# Patient Record
Sex: Female | Born: 1950 | Race: White | Hispanic: No | Marital: Married | Smoking: Never smoker
Health system: Southern US, Community
[De-identification: ages and names within clinical notes are randomized; demographics above are authoritative.]

## PROBLEM LIST (undated history)

## (undated) DIAGNOSIS — M25569 Pain in unspecified knee: Secondary | ICD-10-CM

## (undated) DIAGNOSIS — C541 Malignant neoplasm of endometrium: Secondary | ICD-10-CM

## (undated) DIAGNOSIS — I10 Essential (primary) hypertension: Secondary | ICD-10-CM

## (undated) DIAGNOSIS — Z87442 Personal history of urinary calculi: Secondary | ICD-10-CM

## (undated) DIAGNOSIS — E785 Hyperlipidemia, unspecified: Secondary | ICD-10-CM

## (undated) DIAGNOSIS — E119 Type 2 diabetes mellitus without complications: Secondary | ICD-10-CM

## (undated) DIAGNOSIS — N200 Calculus of kidney: Secondary | ICD-10-CM

## (undated) HISTORY — DX: Type 2 diabetes mellitus without complications: E11.9

## (undated) HISTORY — DX: Calculus of kidney: N20.0

## (undated) HISTORY — DX: Malignant neoplasm of endometrium: C54.1

## (undated) HISTORY — PX: APPENDECTOMY: SHX54

## (undated) HISTORY — DX: Hyperlipidemia, unspecified: E78.5

## (undated) HISTORY — DX: Pain in unspecified knee: M25.569

## (undated) HISTORY — PX: TUBAL LIGATION: SHX77

---

## 2015-06-16 DIAGNOSIS — H2513 Age-related nuclear cataract, bilateral: Secondary | ICD-10-CM | POA: Diagnosis not present

## 2015-08-04 DIAGNOSIS — S8391XA Sprain of unspecified site of right knee, initial encounter: Secondary | ICD-10-CM | POA: Diagnosis not present

## 2015-08-30 ENCOUNTER — Ambulatory Visit (INDEPENDENT_AMBULATORY_CARE_PROVIDER_SITE_OTHER): Payer: Medicare Other | Admitting: Unknown Physician Specialty

## 2015-08-30 ENCOUNTER — Encounter: Payer: Self-pay | Admitting: Unknown Physician Specialty

## 2015-08-30 VITALS — BP 179/96 | HR 73 | Temp 98.2°F | Ht 60.8 in | Wt 189.2 lb

## 2015-08-30 DIAGNOSIS — Z0000 Encounter for general adult medical examination without abnormal findings: Secondary | ICD-10-CM | POA: Diagnosis not present

## 2015-08-30 DIAGNOSIS — M19042 Primary osteoarthritis, left hand: Secondary | ICD-10-CM | POA: Diagnosis not present

## 2015-08-30 DIAGNOSIS — E669 Obesity, unspecified: Secondary | ICD-10-CM | POA: Insufficient documentation

## 2015-08-30 DIAGNOSIS — M1990 Unspecified osteoarthritis, unspecified site: Secondary | ICD-10-CM | POA: Insufficient documentation

## 2015-08-30 DIAGNOSIS — I10 Essential (primary) hypertension: Secondary | ICD-10-CM

## 2015-08-30 DIAGNOSIS — E1159 Type 2 diabetes mellitus with other circulatory complications: Secondary | ICD-10-CM | POA: Insufficient documentation

## 2015-08-30 NOTE — Progress Notes (Signed)
**Note De-Identified Vert Obfuscation** BP 179/96 mmHg  Pulse 73  Temp(Src) 98.2 F (36.8 C)  Ht 5' 0.8" (1.544 m)  Wt 189 lb 3.2 oz (85.821 kg)  BMI 36.00 kg/m2  SpO2 96%  LMP  (LMP Unknown)   Subjective:    Patient ID: Suzanne Jennings, female    DOB: [birth year], 65 y.o.   MRN: [id number]  HPI: [name] is a 65 y.o. female  Chief Complaint  Patient presents with  . Hand Pain    pt states she has been having pain in left fingers and hand from a fall she had years ago   Pt is here to be established as a patient.  She is on no medications and has no health problems.  She is not sure of her family history as she was raised in an orphanage.    Hypertension Average home BP: Checks her BP every week.  It typically runs below SBP of 120  No problems or lightheadedness No chest pain with exertion or shortness of breath No Edema  Family history significant for hypertension  Checks her BP every week.  It typically runs below SBP of 120.    Relevant past medical, surgical, family and social history reviewed and updated as indicated. Interim medical history since our last visit reviewed. Allergies and medications reviewed and updated.  Review of Systems  Constitutional: Negative.   HENT: Negative.   Eyes: Negative.   Respiratory: Negative.   Cardiovascular: Negative.   Gastrointestinal: Negative.   Endocrine: Negative.   Genitourinary: Negative.   Musculoskeletal: Negative.        15 years ago has pain 2 left lateral fingers ever since a fall 15 years ago  Skin: Negative.   Allergic/Immunologic: Negative.   Neurological: Negative.   Hematological: Negative.   Psychiatric/Behavioral: Negative.     Per HPI unless specifically indicated above     Objective:    BP 179/96 mmHg  Pulse 73  Temp(Src) 98.2 F (36.8 C)  Ht 5' 0.8" (1.544 m)  Wt 189 lb 3.2 oz (85.821 kg)  BMI 36.00 kg/m2  SpO2 96%  LMP  (LMP Unknown)  Wt Readings from Last 3 Encounters:  08/30/15 189 lb 3.2 oz (85.821 kg)    Physical  Exam  Constitutional: She is oriented to person, place, and time. She appears well-developed and well-nourished. No distress.  HENT:  Head: Normocephalic and atraumatic.  Eyes: Conjunctivae and lids are normal. Right eye exhibits no discharge. Left eye exhibits no discharge. No scleral icterus.  Neck: Normal range of motion. Neck supple. No JVD present. Carotid bruit is not present.  Cardiovascular: Normal rate, regular rhythm and normal heart sounds.   Pulmonary/Chest: Effort normal and breath sounds normal.  Abdominal: Normal appearance. There is no splenomegaly or hepatomegaly.  Musculoskeletal: Normal range of motion.  Neurological: She is alert and oriented to person, place, and time.  Skin: Skin is warm, dry and intact. No rash noted. No pallor.  Psychiatric: She has a normal mood and affect. Her behavior is normal. Judgment and thought content normal.    No results found for this or any previous visit.    Assessment & Plan:   Problem List Items Addressed This Visit      Unprioritized   Hypertension - Primary    Pt normotensive when she checks it at home.  High here.  Bring in home BP cuff next visit.        Relevant Orders   Comprehensive metabolic panel **Note De-Identified Baranski Obfuscation** Microalbumin, Urine Waived   TSH   Lipid Panel w/o Chol/HDL Ratio   Osteoarthritis (arthritis due to wear and tear of joints)    Supportive care      Obesity    Other Visit Diagnoses    Routine general medical examination at a health care facility        Relevant Orders    Hepatitis C antibody        Follow up plan: Return for physical.   Orders given for a td and Zostavax vaccine

## 2015-08-30 NOTE — Assessment & Plan Note (Signed)
**Note De-Identified Deroche Obfuscation** Pt normotensive when she checks it at home.  High here.  Bring in home BP cuff next visit.

## 2015-08-30 NOTE — Assessment & Plan Note (Signed)
**Note De-identified Gemma Obfuscation** Supportive care. 

## 2015-08-31 ENCOUNTER — Other Ambulatory Visit: Payer: Self-pay | Admitting: Unknown Physician Specialty

## 2015-08-31 DIAGNOSIS — E7800 Pure hypercholesterolemia, unspecified: Secondary | ICD-10-CM

## 2015-08-31 LAB — LIPID PANEL W/O CHOL/HDL RATIO
Cholesterol, Total: 284 mg/dL — ABNORMAL HIGH (ref 100–199)
HDL: 35 mg/dL — ABNORMAL LOW (ref >39)
LDL CALC: 199 mg/dL — AB (ref 0–99)
Triglycerides: 248 mg/dL — ABNORMAL HIGH (ref 0–149)
VLDL Cholesterol Cal: 50 mg/dL — ABNORMAL HIGH (ref 5–40)

## 2015-08-31 LAB — MICROALBUMIN, URINE WAIVED
CREATININE, URINE WAIVED: 200 mg/dL (ref 10–300)
MICROALB, UR WAIVED: 30 mg/L — AB (ref 0–19)
Microalb/Creat Ratio: <30 mg/g (ref <30)

## 2015-08-31 LAB — COMPREHENSIVE METABOLIC PANEL
A/G RATIO: 1.4 (ref 1.1–2.5)
ALK PHOS: 79 IU/L (ref 39–117)
ALT: 32 IU/L (ref 0–32)
AST: 28 IU/L (ref 0–40)
Albumin: 4.2 g/dL (ref 3.6–4.8)
BUN/Creatinine Ratio: 16 (ref 11–26)
BUN: 12 mg/dL (ref 8–27)
Bilirubin Total: 0.7 mg/dL (ref 0.0–1.2)
CO2: 23 mmol/L (ref 18–29)
Calcium: 9.4 mg/dL (ref 8.7–10.3)
Chloride: 99 mmol/L (ref 96–106)
Creatinine, Ser: 0.75 mg/dL (ref 0.57–1.00)
GFR calc Af Amer: 96 mL/min/{1.73_m2} (ref >59)
GFR calc non Af Amer: 83 mL/min/{1.73_m2} (ref >59)
GLOBULIN, TOTAL: 2.9 g/dL (ref 1.5–4.5)
Glucose: 91 mg/dL (ref 65–99)
POTASSIUM: 4.1 mmol/L (ref 3.5–5.2)
SODIUM: 138 mmol/L (ref 134–144)
Total Protein: 7.1 g/dL (ref 6.0–8.5)

## 2015-08-31 LAB — TSH: TSH: 2.19 u[IU]/mL (ref 0.450–4.500)

## 2015-08-31 LAB — HEPATITIS C ANTIBODY

## 2015-09-21 ENCOUNTER — Ambulatory Visit (INDEPENDENT_AMBULATORY_CARE_PROVIDER_SITE_OTHER): Payer: Medicare Other | Admitting: Unknown Physician Specialty

## 2015-09-21 ENCOUNTER — Encounter: Payer: Self-pay | Admitting: Unknown Physician Specialty

## 2015-09-21 VITALS — BP 166/91 | HR 86 | Temp 98.2°F | Ht 61.1 in | Wt 177.2 lb

## 2015-09-21 DIAGNOSIS — Z0000 Encounter for general adult medical examination without abnormal findings: Secondary | ICD-10-CM

## 2015-09-21 DIAGNOSIS — I10 Essential (primary) hypertension: Secondary | ICD-10-CM

## 2015-09-21 DIAGNOSIS — E7800 Pure hypercholesterolemia, unspecified: Secondary | ICD-10-CM

## 2015-09-21 DIAGNOSIS — E785 Hyperlipidemia, unspecified: Secondary | ICD-10-CM | POA: Insufficient documentation

## 2015-09-21 DIAGNOSIS — Z780 Asymptomatic menopausal state: Secondary | ICD-10-CM | POA: Diagnosis not present

## 2015-09-21 LAB — LIPID PANEL PICCOLO, WAIVED
CHOLESTEROL PICCOLO, WAIVED: 236 mg/dL — AB (ref <200)
Chol/HDL Ratio Piccolo,Waive: 6.4 mg/dL — ABNORMAL HIGH
HDL CHOL PICCOLO, WAIVED: 37 mg/dL — AB (ref >59)
LDL Chol Calc Piccolo Waived: 138 mg/dL — ABNORMAL HIGH (ref <100)
TRIGLYCERIDES PICCOLO,WAIVED: 305 mg/dL — AB (ref <150)
VLDL Chol Calc Piccolo,Waive: 61 mg/dL — ABNORMAL HIGH (ref <30)

## 2015-09-21 MED ORDER — ASPIRIN EC 81 MG PO TBEC: 81.0000 mg | DELAYED_RELEASE_TABLET | Freq: Every day | ORAL | Status: AC

## 2015-09-21 MED ORDER — LISINOPRIL 10 MG PO TABS: 10.0000 mg | ORAL_TABLET | Freq: Every day | ORAL | Status: DC

## 2015-09-21 NOTE — Assessment & Plan Note (Signed)
**Note De-Identified Cleckley Obfuscation** LDL down from 199 to 138.  Will continue to work on lifestyle rather than starting a cholesterol medication

## 2015-09-21 NOTE — Assessment & Plan Note (Signed)
**Note De-Identified Lutze Obfuscation** High today with some improvement with lifestyle changes.  Start Lisinopril 10 ng.

## 2015-09-21 NOTE — Progress Notes (Signed)
**Note De-Identified Chivers Obfuscation** BP 166/91 mmHg  Pulse 86  Temp(Src) 98.2 F (36.8 C)  Ht 5' 1.1" (1.552 m)  Wt 177 lb 3.2 oz (80.377 kg)  BMI 33.37 kg/m2  SpO2 96%  LMP  (LMP Unknown)   Subjective:    Patient ID: Suzanne Jennings, female    DOB: 07-29-1951, 65 y.o.   MRN: [id number]  HPI: [name] is a 65 y.o. female  Chief Complaint  Patient presents with  . Medicare Wellness   Functional Status Survey: Is the patient deaf or have difficulty hearing?: No Does the patient have difficulty seeing, even when wearing glasses/contacts?: No Does the patient have difficulty concentrating, remembering, or making decisions?: No Does the patient have difficulty walking or climbing stairs?: No Does the patient have difficulty dressing or bathing?: No Does the patient have difficulty doing errands alone such as visiting a doctor's office or shopping?: No  Depression screen [facility] 2/9 09/21/2015 08/30/2015  Decreased Interest 0 0  Down, Depressed, Hopeless 0 0  PHQ - 2 Score 0 0    Fall Risk  09/21/2015 08/30/2015  Falls in the past year? No No    Hypertension Better than previous but still high Average home BPs SBP 160s at home  No problems or lightheadedness No chest pain with exertion or shortness of breath No Edema  Relevant past medical, surgical, family and social history reviewed and updated as indicated. Interim medical history since our last visit reviewed. Allergies and medications reviewed and updated.  Review of Systems  Constitutional: Negative.   HENT: Negative.   Eyes: Negative.   Respiratory: Negative.   Cardiovascular: Negative.   Gastrointestinal: Negative.   Endocrine: Negative.   Genitourinary: Negative.   Musculoskeletal: Negative.   Skin: Negative.   Allergic/Immunologic: Negative.   Neurological: Negative.   Hematological: Negative.   Psychiatric/Behavioral: Negative.     Per HPI unless specifically indicated above     Objective:    BP 166/91 mmHg  Pulse 86  Temp(Src) 98.2  F (36.8 C)  Ht 5' 1.1" (1.552 m)  Wt 177 lb 3.2 oz (80.377 kg)  BMI 33.37 kg/m2  SpO2 96%  LMP  (LMP Unknown)  Wt Readings from Last 3 Encounters:  09/21/15 177 lb 3.2 oz (80.377 kg)  08/30/15 189 lb 3.2 oz (85.821 kg)    Physical Exam  Constitutional: She is oriented to person, place, and time. She appears well-developed and well-nourished.  HENT:  Head: Normocephalic and atraumatic.  Eyes: Pupils are equal, round, and reactive to light. Right eye exhibits no discharge. Left eye exhibits no discharge. No scleral icterus.  Neck: Normal range of motion. Neck supple. Carotid bruit is not present. No thyromegaly present.  Cardiovascular: Normal rate, regular rhythm and normal heart sounds.  Exam reveals no gallop and no friction rub.   No murmur heard. Pulmonary/Chest: Effort normal and breath sounds normal. No respiratory distress. She has no wheezes. She has no rales.  Abdominal: Soft. Bowel sounds are normal. There is no tenderness. There is no rebound.  Genitourinary: No breast swelling, tenderness or discharge.  Musculoskeletal: Normal range of motion.  Lymphadenopathy:    She has no cervical adenopathy.  Neurological: She is alert and oriented to person, place, and time.  Skin: Skin is warm, dry and intact. No rash noted.  Psychiatric: She has a normal mood and affect. Her speech is normal and behavior is normal. Judgment and thought content normal. Cognition and memory are normal.    Results for orders placed **Note De-Identified Heffron Obfuscation** or performed in visit on 08/30/15  Comprehensive metabolic panel  Result Value Ref Range   Glucose 91 65 - 99 mg/dL   BUN 12 8 - 27 mg/dL   Creatinine, Ser 0.75 0.57 - 1.00 mg/dL   GFR calc non Af Amer 83 >59 mL/min/1.73   GFR calc Af Amer 96 >59 mL/min/1.73   BUN/Creatinine Ratio 16 11 - 26   Sodium 138 134 - 144 mmol/L   Potassium 4.1 3.5 - 5.2 mmol/L   Chloride 99 96 - 106 mmol/L   CO2 23 18 - 29 mmol/L   Calcium 9.4 8.7 - 10.3 mg/dL   Total Protein 7.1 6.0 -  8.5 g/dL   Albumin 4.2 3.6 - 4.8 g/dL   Globulin, Total 2.9 1.5 - 4.5 g/dL   Albumin/Globulin Ratio 1.4 1.1 - 2.5   Bilirubin Total 0.7 0.0 - 1.2 mg/dL   Alkaline Phosphatase 79 39 - 117 IU/L   AST 28 0 - 40 IU/L   ALT 32 0 - 32 IU/L  Microalbumin, Urine Waived  Result Value Ref Range   Microalb, Ur Waived 30 (H) 0 - 19 mg/L   Creatinine, Urine Waived 200 10 - 300 mg/dL   Microalb/Creat Ratio <30 <30 mg/g  TSH  Result Value Ref Range   TSH 2.190 0.450 - 4.500 uIU/mL  Lipid Panel w/o Chol/HDL Ratio  Result Value Ref Range   Cholesterol, Total 284 (H) 100 - 199 mg/dL   Triglycerides 248 (H) 0 - 149 mg/dL   HDL 35 (L) >39 mg/dL   VLDL Cholesterol Cal 50 (H) 5 - 40 mg/dL   LDL Calculated 199 (H) 0 - 99 mg/dL  Hepatitis C antibody  Result Value Ref Range   Hep C Virus Ab <0.1 0.0 - 0.9 s/co ratio      Assessment & Plan:   Problem List Items Addressed This Visit      Unprioritized   Hypertension    High today with some improvement with lifestyle changes.  Start Lisinopril 10 ng.        Relevant Orders   EKG 12-Lead   Hypercholesteremia - Primary   Relevant Orders   Lipid Panel Piccolo, Waived   EKG 12-Lead    Other Visit Diagnoses    Routine general medical examination at a health care facility        Relevant Orders    Cologuard    MM DIGITAL SCREENING BILATERAL    Menopause        Relevant Orders    DG Bone Density        Follow up plan: Return in about 1 month (around 10/19/2015).

## 2015-09-21 NOTE — Patient Instructions (Addendum)
**Note De-identified Friel Obfuscation** Please do call to schedule your mammogram and bone density; the number to schedule one at either [facility] or [facility] is [phone]   

## 2015-09-28 ENCOUNTER — Other Ambulatory Visit: Payer: Self-pay

## 2015-09-28 MED ORDER — LISINOPRIL 10 MG PO TABS: 10.0000 mg | ORAL_TABLET | Freq: Every day | ORAL | Status: DC

## 2015-09-28 NOTE — Addendum Note (Signed)
**Note De-Identified Cremeens Obfuscation** Addended by: Kathrine Haddock on: 09/28/2015 05:12 PM   Modules accepted: Orders

## 2015-09-28 NOTE — Telephone Encounter (Signed)
**Note De-Identified Staniszewski Obfuscation** We just refilled this on 09/21/15 but pharmacy is stating that insurance is requesting a 90 day supply.

## 2015-09-28 NOTE — Telephone Encounter (Signed)
**Note De-identified Tones Obfuscation** Pharmacy requests 90 day supply

## 2015-10-05 ENCOUNTER — Other Ambulatory Visit: Payer: Self-pay | Admitting: Unknown Physician Specialty

## 2015-10-05 ENCOUNTER — Encounter: Payer: Self-pay | Admitting: Unknown Physician Specialty

## 2015-10-05 ENCOUNTER — Ambulatory Visit
Admission: RE | Admit: 2015-10-05 | Discharge: 2015-10-05 | Disposition: A | Discharge status: Home / Self Care | Payer: Medicare Other | Source: Ambulatory Visit | Attending: Unknown Physician Specialty | Admitting: Unknown Physician Specialty

## 2015-10-05 DIAGNOSIS — Z13820 Encounter for screening for osteoporosis: Secondary | ICD-10-CM | POA: Insufficient documentation

## 2015-10-05 DIAGNOSIS — Z780 Asymptomatic menopausal state: Secondary | ICD-10-CM

## 2015-10-05 DIAGNOSIS — Z1231 Encounter for screening mammogram for malignant neoplasm of breast: Secondary | ICD-10-CM | POA: Insufficient documentation

## 2015-10-05 DIAGNOSIS — Z0000 Encounter for general adult medical examination without abnormal findings: Secondary | ICD-10-CM

## 2015-10-05 DIAGNOSIS — M810 Age-related osteoporosis without current pathological fracture: Secondary | ICD-10-CM | POA: Diagnosis not present

## 2015-10-05 NOTE — Progress Notes (Signed)
**Note De-identified Payne Obfuscation** Quick Note:  Normal labs. Patient notified by letter. ______ 

## 2015-10-20 ENCOUNTER — Ambulatory Visit (INDEPENDENT_AMBULATORY_CARE_PROVIDER_SITE_OTHER): Payer: Medicare Other | Admitting: Unknown Physician Specialty

## 2015-10-20 ENCOUNTER — Encounter: Payer: Self-pay | Admitting: Unknown Physician Specialty

## 2015-10-20 VITALS — BP 136/79 | HR 71 | Temp 98.0°F | Ht 60.8 in | Wt 172.0 lb

## 2015-10-20 DIAGNOSIS — E7800 Pure hypercholesterolemia, unspecified: Secondary | ICD-10-CM | POA: Diagnosis not present

## 2015-10-20 DIAGNOSIS — I10 Essential (primary) hypertension: Secondary | ICD-10-CM

## 2015-10-20 DIAGNOSIS — Z0000 Encounter for general adult medical examination without abnormal findings: Secondary | ICD-10-CM

## 2015-10-20 DIAGNOSIS — E669 Obesity, unspecified: Secondary | ICD-10-CM | POA: Diagnosis not present

## 2015-10-20 LAB — LIPID PANEL PICCOLO, WAIVED
Chol/HDL Ratio Piccolo,Waive: 6.1 mg/dL — ABNORMAL HIGH
Cholesterol Piccolo, Waived: 261 mg/dL — ABNORMAL HIGH (ref <200)
HDL Chol Piccolo, Waived: 43 mg/dL — ABNORMAL LOW (ref >59)
LDL CHOL CALC PICCOLO WAIVED: 169 mg/dL — AB (ref <100)
Triglycerides Piccolo,Waived: 246 mg/dL — ABNORMAL HIGH (ref <150)
VLDL CHOL CALC PICCOLO,WAIVE: 49 mg/dL — AB (ref <30)

## 2015-10-20 NOTE — Assessment & Plan Note (Signed)
**Note De-Identified Cadenhead Obfuscation** Patient doing well and losing weight. Continue current diet regimen.

## 2015-10-20 NOTE — Assessment & Plan Note (Signed)
**Note De-identified Barbone Obfuscation** Stable, continue present medications.   

## 2015-10-20 NOTE — Progress Notes (Signed)
**Note De-Identified Goree Obfuscation** BP 136/79 mmHg  Pulse 71  Temp(Src) 98 F (36.7 C)  Ht 5' 0.8" (1.544 m)  Wt 172 lb (78.019 kg)  BMI 32.73 kg/m2  SpO2 98%  LMP  (LMP Unknown)   Subjective:    Patient ID: Suzanne Jennings, female    DOB: 1951-07-05, 65 y.o.   MRN: [id number]  HPI: Suzanne Jennings is a 65 y.o. female  Chief Complaint  Patient presents with  . Hypertension   Hypertension Using medications without difficulty Average home BPs: 130s/70s No problems or lightheadedness No chest pain with exertion or shortness of breath No Edema  Obesity: Patient has been working on losing weight and is doing really well. Doing the [employer] diet-no sugar, breads, or fried foods. States she "feels great."  Hypercholesteremia: Not currently on medication. Has been losing weight with diet and exercise.   Relevant past medical, surgical, family and social history reviewed and updated as indicated. Interim medical history since our last visit reviewed. Allergies and medications reviewed and updated.  Review of Systems  Constitutional: Negative.   HENT: Negative.   Eyes: Negative.   Respiratory: Negative.   Cardiovascular: Negative.   Gastrointestinal: Negative.   Endocrine: Negative.   Genitourinary: Negative.   Musculoskeletal: Negative.   Skin: Negative.   Allergic/Immunologic: Negative.   Neurological: Negative.   Hematological: Negative.   Psychiatric/Behavioral: Negative.     Per HPI unless specifically indicated above     Objective:    BP 136/79 mmHg  Pulse 71  Temp(Src) 98 F (36.7 C)  Ht 5' 0.8" (1.544 m)  Wt 172 lb (78.019 kg)  BMI 32.73 kg/m2  SpO2 98%  LMP  (LMP Unknown)  Wt Readings from Last 3 Encounters:  10/20/15 172 lb (78.019 kg)  09/21/15 177 lb 3.2 oz (80.377 kg)  08/30/15 189 lb 3.2 oz (85.821 kg)    Physical Exam  Constitutional: She is oriented to person, place, and time. She appears well-developed and well-nourished. No distress.  HENT:  Head: Normocephalic and  atraumatic.  Eyes: Conjunctivae and lids are normal. Right eye exhibits no discharge. Left eye exhibits no discharge. No scleral icterus.  Neck: Normal range of motion. Neck supple. No JVD present. Carotid bruit is not present.  Cardiovascular: Normal rate, regular rhythm and normal heart sounds.   Pulmonary/Chest: Effort normal and breath sounds normal.  Abdominal: Normal appearance. There is no splenomegaly or hepatomegaly.  Musculoskeletal: Normal range of motion.  Neurological: She is alert and oriented to person, place, and time.  Skin: Skin is warm, dry and intact. No rash noted. No pallor.  Psychiatric: She has a normal mood and affect. Her behavior is normal. Judgment and thought content normal.    Results for orders placed or performed in visit on 09/21/15  Lipid Panel Piccolo, [employer]  Result Value Ref Range   Cholesterol Piccolo, Waived 236 (H) <200 mg/dL   HDL Chol Piccolo, Waived 37 (L) >59 mg/dL   Triglycerides Piccolo,Waived 305 (H) <150 mg/dL   Chol/HDL Ratio Piccolo,Waive 6.4 (H) mg/dL   LDL Chol Calc Piccolo Waived 138 (H) <100 mg/dL   VLDL Chol Calc Piccolo,Waive 61 (H) <30 mg/dL      Assessment & Plan:   Problem List Items Addressed This Visit      Unprioritized   Hypertension - Primary    Stable, continue present medications.        Relevant Orders   Comprehensive metabolic panel   Obesity    Patient doing well and **Note De-Identified Lemke Obfuscation** losing weight. Continue current diet regimen.       Hypercholesteremia    Per ASCVD calculator, moderate-high intensity statin recommended, however triglycerides and HDL improving with diet and exercise. Will recheck in 3 months and consider adding statin at that time.      Relevant Orders   Lipid Panel Piccolo, [place]    Other Visit Diagnoses    Routine general medical examination at a health care facility            Follow up plan: Return in about 3 months (around 01/20/2016).

## 2015-10-20 NOTE — Assessment & Plan Note (Signed)
**Note De-Identified Filter Obfuscation** Per ASCVD calculator, moderate-high intensity statin recommended, however triglycerides and HDL improving with diet and exercise. Will recheck in 3 months and consider adding statin at that time.

## 2015-10-21 LAB — COMPREHENSIVE METABOLIC PANEL
A/G RATIO: 1.4 (ref 1.1–2.5)
ALBUMIN: 4.2 g/dL (ref 3.6–4.8)
ALK PHOS: 77 IU/L (ref 39–117)
ALT: 50 IU/L — ABNORMAL HIGH (ref 0–32)
AST: 30 IU/L (ref 0–40)
BILIRUBIN TOTAL: 0.6 mg/dL (ref 0.0–1.2)
BUN / CREAT RATIO: 11 (ref 11–26)
BUN: 9 mg/dL (ref 8–27)
CHLORIDE: 100 mmol/L (ref 96–106)
CO2: 24 mmol/L (ref 18–29)
Calcium: 9.7 mg/dL (ref 8.7–10.3)
Creatinine, Ser: 0.85 mg/dL (ref 0.57–1.00)
GFR calc non Af Amer: 72 mL/min/{1.73_m2} (ref >59)
GFR, EST AFRICAN AMERICAN: 83 mL/min/{1.73_m2} (ref >59)
GLUCOSE: 104 mg/dL — AB (ref 65–99)
Globulin, Total: 3 g/dL (ref 1.5–4.5)
POTASSIUM: 5.1 mmol/L (ref 3.5–5.2)
Sodium: 141 mmol/L (ref 134–144)
Total Protein: 7.2 g/dL (ref 6.0–8.5)

## 2016-01-21 ENCOUNTER — Encounter: Payer: Self-pay | Admitting: Unknown Physician Specialty

## 2016-01-21 ENCOUNTER — Ambulatory Visit (INDEPENDENT_AMBULATORY_CARE_PROVIDER_SITE_OTHER): Payer: Medicare Other | Admitting: Unknown Physician Specialty

## 2016-01-21 VITALS — BP 136/84 | HR 76 | Temp 97.7°F | Ht 61.0 in | Wt 174.0 lb

## 2016-01-21 DIAGNOSIS — I10 Essential (primary) hypertension: Secondary | ICD-10-CM

## 2016-01-21 DIAGNOSIS — E8881 Metabolic syndrome: Secondary | ICD-10-CM

## 2016-01-21 DIAGNOSIS — L649 Androgenic alopecia, unspecified: Secondary | ICD-10-CM | POA: Diagnosis not present

## 2016-01-21 DIAGNOSIS — Z0000 Encounter for general adult medical examination without abnormal findings: Secondary | ICD-10-CM | POA: Diagnosis not present

## 2016-01-21 DIAGNOSIS — E7800 Pure hypercholesterolemia, unspecified: Secondary | ICD-10-CM

## 2016-01-21 LAB — LIPID PANEL PICCOLO, WAIVED
Chol/HDL Ratio Piccolo,Waive: 6.1 mg/dL — ABNORMAL HIGH
Cholesterol Piccolo, Waived: 272 mg/dL — ABNORMAL HIGH
HDL Chol Piccolo, Waived: 44 mg/dL — ABNORMAL LOW
LDL Chol Calc Piccolo Waived: 171 mg/dL — ABNORMAL HIGH
Triglycerides Piccolo,Waived: 283 mg/dL — ABNORMAL HIGH
VLDL Chol Calc Piccolo,Waive: 57 mg/dL — ABNORMAL HIGH

## 2016-01-21 MED ORDER — SPIRONOLACTONE 25 MG PO TABS: 25.0000 mg | ORAL_TABLET | Freq: Every day | ORAL | Status: DC

## 2016-01-21 MED ORDER — LISINOPRIL 10 MG PO TABS: 10.0000 mg | ORAL_TABLET | Freq: Every day | ORAL | Status: DC

## 2016-01-21 MED ORDER — ATORVASTATIN CALCIUM 10 MG PO TABS: 10.0000 mg | ORAL_TABLET | Freq: Every day | ORAL | Status: DC

## 2016-01-21 NOTE — Progress Notes (Signed)
**Note De-Identified Gadbois Obfuscation** BP 136/84 mmHg  Pulse 76  Temp(Src) 97.7 F (36.5 C)  Ht 5\' 1"  (1.549 m)  Wt 174 lb (78.926 kg)  BMI 32.89 kg/m2  SpO2 95%  LMP  (LMP Unknown)   Subjective:    Patient ID: Suzanne Jennings, female    DOB: 1951/01/14, 65 y.o.   MRN: [id number]  HPI: Suzanne Jennings is a 65 y.o. female  Chief Complaint  Patient presents with  . Hypertension  . Hyperlipidemia  . Medication Refill    pt states she needs refill on lisinopril   Hypertension Using medications without difficulty Average home BPs   No problems or lightheadedness No chest pain with exertion or shortness of breath No Edema   Hyperlipidemia Using medications without problems: No Muscle aches  Diet compliance:On a low sugar diet Exercise: walking   Relevant past medical, surgical, family and social history reviewed and updated as indicated. Interim medical history since our last visit reviewed. Allergies and medications reviewed and updated.  Review of Systems  Constitutional: Negative.   HENT: Negative.   Eyes: Negative.   Respiratory: Negative.   Cardiovascular: Negative.   Gastrointestinal: Negative.   Endocrine: Negative.   Genitourinary: Negative.   Musculoskeletal: Negative.   Skin:       She is noticing hair loss  Allergic/Immunologic: Negative.   Neurological: Negative.   Hematological: Negative.   Psychiatric/Behavioral: Negative.     Per HPI unless specifically indicated above     Objective:    BP 136/84 mmHg  Pulse 76  Temp(Src) 97.7 F (36.5 C)  Ht 5\' 1"  (1.549 m)  Wt 174 lb (78.926 kg)  BMI 32.89 kg/m2  SpO2 95%  LMP  (LMP Unknown)  Wt Readings from Last 3 Encounters:  01/21/16 174 lb (78.926 kg)  10/20/15 172 lb (78.019 kg)  09/21/15 177 lb 3.2 oz (80.377 kg)    Physical Exam  Constitutional: She is oriented to person, place, and time. She appears well-developed and well-nourished. No distress.  HENT:  Head: Normocephalic and atraumatic.  Eyes: Conjunctivae and lids are  normal. Right eye exhibits no discharge. Left eye exhibits no discharge. No scleral icterus.  Neck: Normal range of motion. Neck supple. No JVD present. Carotid bruit is not present.  Cardiovascular: Normal rate, regular rhythm and normal heart sounds.   Pulmonary/Chest: Effort normal and breath sounds normal.  Abdominal: Normal appearance. There is no splenomegaly or hepatomegaly.  Musculoskeletal: Normal range of motion.  Neurological: She is alert and oriented to person, place, and time.  Skin: Skin is warm, dry and intact. No rash noted. No pallor.  Noted female pattern hair thinning  Psychiatric: She has a normal mood and affect. Her behavior is normal. Judgment and thought content normal.    Results for orders placed or performed in visit on 10/20/15  Lipid Panel Piccolo, [employer]  Result Value Ref Range   Cholesterol Piccolo, Waived 261 (H) <200 mg/dL   HDL Chol Piccolo, Waived 43 (L) >59 mg/dL   Triglycerides Piccolo,Waived 246 (H) <150 mg/dL   Chol/HDL Ratio Piccolo,Waive 6.1 (H) mg/dL   LDL Chol Calc Piccolo Waived 169 (H) <100 mg/dL   VLDL Chol Calc Piccolo,Waive 49 (H) <30 mg/dL  Comprehensive metabolic panel  Result Value Ref Range   Glucose 104 (H) 65 - 99 mg/dL   BUN 9 8 - 27 mg/dL   Creatinine, Ser 0.85 0.57 - 1.00 mg/dL   GFR calc non Af Amer 72 >59 mL/min/1.73   GFR calc Af **Note De-Identified Roher Obfuscation** Amer 83 >59 mL/min/1.73   BUN/Creatinine Ratio 11 11 - 26   Sodium 141 134 - 144 mmol/L   Potassium 5.1 3.5 - 5.2 mmol/L   Chloride 100 96 - 106 mmol/L   CO2 24 18 - 29 mmol/L   Calcium 9.7 8.7 - 10.3 mg/dL   Total Protein 7.2 6.0 - 8.5 g/dL   Albumin 4.2 3.6 - 4.8 g/dL   Globulin, Total 3.0 1.5 - 4.5 g/dL   Albumin/Globulin Ratio 1.4 1.1 - 2.5   Bilirubin Total 0.6 0.0 - 1.2 mg/dL   Alkaline Phosphatase 77 39 - 117 IU/L   AST 30 0 - 40 IU/L   ALT 50 (H) 0 - 32 IU/L      Assessment & Plan:   Problem List Items Addressed This Visit      Unprioritized   Alopecia, 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  Check K at next visit.        Hypercholesteremia    Discussed labs.  LDL is 171.  Start Atorvastatin 10 mg.        Relevant Medications   atorvastatin (LIPITOR) 10 MG tablet   lisinopril (PRINIVIL,ZESTRIL) 10 MG tablet   Other Relevant Orders   Lipid Panel Piccolo, Waived   Hypertension - Primary    Stable, continue present medications.        Relevant Medications   atorvastatin (LIPITOR) 10 MG tablet   lisinopril (PRINIVIL,ZESTRIL) 10 MG tablet   Other Relevant Orders   Comprehensive metabolic panel   Metabolic syndrome    Other Visit Diagnoses    Routine general medical examination at a health care facility        Relevant Orders    Cologuard        Follow up plan: Return in about 3 months (around 04/22/2016) for CMP and Lipid panel.

## 2016-01-21 NOTE — Assessment & Plan Note (Addendum)
**Note De-Identified Wohl Obfuscation** Start Spironalactone 25 mg due to female pattern hair loss.  Stop Lisinopril

## 2016-01-21 NOTE — Assessment & Plan Note (Signed)
**Note De-Identified Lazarus Obfuscation** Start Spironalactone 25 mg.  Check K at next visit.

## 2016-01-21 NOTE — Assessment & Plan Note (Signed)
**Note De-Identified Panzer Obfuscation** Discussed labs.  LDL is 171.  Start Atorvastatin 10 mg.

## 2016-01-22 LAB — COMPREHENSIVE METABOLIC PANEL
ALBUMIN: 4.1 g/dL (ref 3.6–4.8)
ALT: 18 IU/L (ref 0–32)
AST: 18 IU/L (ref 0–40)
Albumin/Globulin Ratio: 1.4 (ref 1.2–2.2)
Alkaline Phosphatase: 68 IU/L (ref 39–117)
BUN / CREAT RATIO: 17 (ref 12–28)
BUN: 15 mg/dL (ref 8–27)
Bilirubin Total: 0.5 mg/dL (ref 0.0–1.2)
CALCIUM: 9.6 mg/dL (ref 8.7–10.3)
CO2: 24 mmol/L (ref 18–29)
CREATININE: 0.86 mg/dL (ref 0.57–1.00)
Chloride: 98 mmol/L (ref 96–106)
GFR calc non Af Amer: 71 mL/min/{1.73_m2} (ref >59)
GFR, EST AFRICAN AMERICAN: 81 mL/min/{1.73_m2} (ref >59)
GLUCOSE: 103 mg/dL — AB (ref 65–99)
Globulin, Total: 2.9 g/dL (ref 1.5–4.5)
Potassium: 4.7 mmol/L (ref 3.5–5.2)
Sodium: 138 mmol/L (ref 134–144)
TOTAL PROTEIN: 7 g/dL (ref 6.0–8.5)

## 2016-01-24 ENCOUNTER — Encounter: Payer: Self-pay | Admitting: Unknown Physician Specialty

## 2016-01-24 NOTE — Progress Notes (Signed)
**Note De-identified Trampe Obfuscation** Quick Note:  Normal labs. Patient notified by letter. ______ 

## 2016-04-12 DIAGNOSIS — Z1212 Encounter for screening for malignant neoplasm of rectum: Secondary | ICD-10-CM | POA: Diagnosis not present

## 2016-04-12 DIAGNOSIS — Z1211 Encounter for screening for malignant neoplasm of colon: Secondary | ICD-10-CM | POA: Diagnosis not present

## 2016-04-12 LAB — COLOGUARD: Cologuard: POSITIVE

## 2016-04-24 ENCOUNTER — Telehealth: Payer: Self-pay

## 2016-04-24 NOTE — Telephone Encounter (Signed)
**Note De-Identified Brumbaugh Obfuscation** Result for cologuard came in through fax- positive result.

## 2016-04-25 NOTE — Telephone Encounter (Signed)
**Note De-Identified Hudson Obfuscation** Called to discuss positive cologuard with patient. LMOM for her to call me back. Will need referral to GI. Await call back.

## 2016-05-02 ENCOUNTER — Ambulatory Visit: Payer: Medicare Other | Admitting: Unknown Physician Specialty

## 2016-05-03 ENCOUNTER — Telehealth: Payer: Self-pay

## 2016-05-03 DIAGNOSIS — R195 Other fecal abnormalities: Secondary | ICD-10-CM

## 2016-05-03 NOTE — Telephone Encounter (Signed)
**Note De-Identified Shryock Obfuscation** Patient called and left me a voicemail saying she was returning Cheryl's call. She would like a call back from [place] to discuss test results. [phone].

## 2016-05-03 NOTE — Telephone Encounter (Signed)
**Note De-Identified Marcott Obfuscation** Discussed with pt positive Cologuard.  Will refer to GI for further evaluation.

## 2016-05-05 ENCOUNTER — Other Ambulatory Visit: Payer: Self-pay

## 2016-05-05 ENCOUNTER — Telehealth: Payer: Self-pay

## 2016-05-05 NOTE — Telephone Encounter (Signed)
**Note De-Identified Rote Obfuscation** Rescheduled patient from 06/01/2016 to 07/03/2016. Pre cert is not required

## 2016-05-05 NOTE — Telephone Encounter (Signed)
**Note De-Identified Ruppert Obfuscation** Colonoscopy: Positive FIT R19.5 [facility] 05/29/2016 [employer]/Medicare Pre cert

## 2016-05-05 NOTE — Telephone Encounter (Signed)
**Note De-Identified Bellis Obfuscation** Gastroenterology Pre-Procedure Review  Request Date: 06/01/2016 Requesting Physician: Dr. Julian Hy  PATIENT REVIEW QUESTIONS: The patient responded to the following health history questions as indicated:    1. Are you having any GI issues? no 2. Do you have a personal history of Polyps? no 3. Do you have a family history of Colon Cancer or Polyps? no 4. Diabetes Mellitus? no 5. Joint replacements in the past 12 months?no 6. Major health problems in the past 3 months?no 7. Any artificial heart valves, MVP, or defibrillator?no    MEDICATIONS & ALLERGIES:    Patient reports the following regarding taking any anticoagulation/antiplatelet therapy:   Plavix, Coumadin, Eliquis, Xarelto, Lovenox, Pradaxa, Brilinta, or Effient? no Aspirin? yes ([facility])  Patient confirms/reports the following medications:  Current Outpatient Prescriptions  Medication Sig Dispense Refill  . aspirin EC 81 MG tablet Take 1 tablet (81 mg total) by mouth daily. 90 tablet 3  . atorvastatin (LIPITOR) 10 MG tablet Take 10 mg by mouth daily.    Marland Kitchen spironolactone (ALDACTONE) 25 MG tablet Take 1 tablet (25 mg total) by mouth daily. 90 tablet 1   No current facility-administered medications for this visit.     Patient confirms/reports the following allergies:  Allergies  Allergen Reactions  . Aspirin Other (See Comments)    "blood thinner, makes nose bleed"  . Peanut-Containing Drug Products   . Penicillins   . Sulfa Antibiotics     No orders of the defined types were placed in this encounter.   AUTHORIZATION INFORMATION Primary Insurance: 1D#: Group #:  Secondary Insurance: 1D#: Group #:  SCHEDULE INFORMATION: Date: 05/29/2016 Time: Location: [facility]

## 2016-05-05 NOTE — Telephone Encounter (Signed)
**Note De-Identified [place] Obfuscation** Notification or Prior Authorization is not required for the requested services  This [employer] Advantage members plan does not currently require a prior authorization for these services. If you have general questions about the prior authorization requirements, please call us at [phone] or visit [email or web address] > Clinician Resources > Advance and Admission Notification Requirements. The number above acknowledges your notification. Please write this number down for future reference. Notification is not a guarantee of coverage or payment.   Decision ID #: [id number]  The number above acknowledges your inquiry and our response. Please write this number down and refer to it for future inquiries. Coverage and payment for an item or service is governed by the member's benefit plan document, and, if applicable, the provider's participation agreement with the Health Plan. Thank you.

## 2016-05-12 ENCOUNTER — Ambulatory Visit: Payer: Medicare Other | Admitting: Unknown Physician Specialty

## 2016-05-29 ENCOUNTER — Encounter: Payer: Self-pay | Admitting: Unknown Physician Specialty

## 2016-05-29 ENCOUNTER — Ambulatory Visit (INDEPENDENT_AMBULATORY_CARE_PROVIDER_SITE_OTHER): Payer: Medicare Other | Admitting: Unknown Physician Specialty

## 2016-05-29 VITALS — BP 129/78 | HR 74 | Temp 98.5°F | Ht 61.1 in | Wt 184.2 lb

## 2016-05-29 DIAGNOSIS — I10 Essential (primary) hypertension: Secondary | ICD-10-CM

## 2016-05-29 DIAGNOSIS — E7800 Pure hypercholesterolemia, unspecified: Secondary | ICD-10-CM

## 2016-05-29 LAB — LIPID PANEL PICCOLO, WAIVED
CHOL/HDL RATIO PICCOLO,WAIVE: 4.3 mg/dL
CHOLESTEROL PICCOLO, WAIVED: 176 mg/dL (ref <200)
HDL CHOL PICCOLO, WAIVED: 41 mg/dL — AB (ref >59)
LDL Chol Calc Piccolo Waived: 76 mg/dL (ref <100)
TRIGLYCERIDES PICCOLO,WAIVED: 298 mg/dL — AB (ref <150)
VLDL Chol Calc Piccolo,Waive: 60 mg/dL — ABNORMAL HIGH (ref <30)

## 2016-05-29 NOTE — Assessment & Plan Note (Signed)
**Note De-identified Berko Obfuscation** Stable, continue present medications.   

## 2016-05-29 NOTE — Assessment & Plan Note (Signed)
**Note De-Identified Hunsucker Obfuscation** Reviewed lipid panel.  LDL was 76.  Continue present medications.

## 2016-05-29 NOTE — Assessment & Plan Note (Signed)
**Note De-Identified Neumann Obfuscation** Restart diet

## 2016-05-29 NOTE — Progress Notes (Signed)
**Note De-identified Bozarth Obfuscation**   **Note De-Identified Silfies Obfuscation** BP 129/78 (BP Location: Left Arm, Patient Position: Sitting, Cuff Size: Large)   Pulse 74   Temp 98.5 F (36.9 C)   Ht 5' 1.1" (1.552 m)   Wt 184 lb 3.2 oz (83.6 kg)   LMP  (LMP Unknown)   SpO2 95%   BMI 34.69 kg/m    Subjective:    Patient ID: Suzanne Jennings, female    DOB: [birth year], 65 y.o.   MRN: [id number]  HPI: Suzanne Jennings is a 65 y.o. female  Chief Complaint  Patient presents with  . Hyperlipidemia  . Hypertension   Hypertension Using medications without difficulty.  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 Interim medical history since our last visit reviewed. Allergies and medications reviewed and updated.  Review of Systems  Per HPI unless specifically indicated above     Objective:    BP 129/78 (BP Location: Left Arm, Patient Position: Sitting, Cuff Size: Large)   Pulse 74   Temp 98.5 F (36.9 C)   Ht 5' 1.1" (1.552 m)   Wt 184 lb 3.2 oz (83.6 kg)   LMP  (LMP Unknown)   SpO2 95%   BMI 34.69 kg/m   Wt Readings from Last 3 Encounters:  05/29/16 184 lb 3.2 oz (83.6 kg)  01/21/16 174 lb (78.9 kg)  10/20/15 172 lb (78 kg)    Physical Exam  Constitutional: She is oriented to person, place, and time. She appears well-developed and well-nourished. No distress.  HENT:  Head: Normocephalic and atraumatic.  Eyes: Conjunctivae and lids are normal. Right eye exhibits no discharge. Left eye exhibits no discharge. No scleral icterus.  Neck: Normal range of motion. Neck supple. No JVD present. Carotid bruit is not present.  Cardiovascular: Normal rate, regular rhythm and normal heart sounds.   Pulmonary/Chest:  Effort normal and breath sounds normal.  Abdominal: Normal appearance. There is no splenomegaly or hepatomegaly.  Musculoskeletal: Normal range of motion.  Neurological: She is alert and oriented to person, place, and time.  Skin: Skin is warm, dry and intact. No rash noted. No pallor.  Psychiatric: She has a normal mood and affect. Her behavior is normal. Judgment and thought content normal.    Results for orders placed or performed in visit on 04/24/16  Cologuard  Result Value Ref Range   Cologuard Positive       Assessment & Plan:   Problem List Items Addressed This Visit      Unprioritized   Hypercholesteremia - Primary    Reviewed lipid panel.  LDL was 76.  Continue present medications.         Relevant Orders   Comprehensive metabolic panel   Lipid Panel Piccolo, Waived   Hypertension    Stable, continue present medications.        Relevant Orders   Comprehensive metabolic panel    Other Visit Diagnoses   None.      Follow up plan: Return in about 6 months (around 11/27/2016) for physical.

## 2016-05-30 ENCOUNTER — Encounter: Payer: Self-pay | Admitting: Unknown Physician Specialty

## 2016-05-30 LAB — COMPREHENSIVE METABOLIC PANEL
A/G RATIO: 1.3 (ref 1.2–2.2)
ALT: 20 IU/L (ref 0–32)
AST: 21 IU/L (ref 0–40)
Albumin: 4.1 g/dL (ref 3.6–4.8)
Alkaline Phosphatase: 57 IU/L (ref 39–117)
BILIRUBIN TOTAL: 0.4 mg/dL (ref 0.0–1.2)
BUN/Creatinine Ratio: 18 (ref 12–28)
BUN: 14 mg/dL (ref 8–27)
CALCIUM: 9.8 mg/dL (ref 8.7–10.3)
CHLORIDE: 101 mmol/L (ref 96–106)
CO2: 22 mmol/L (ref 18–29)
Creatinine, Ser: 0.77 mg/dL (ref 0.57–1.00)
GFR calc Af Amer: 92 mL/min/{1.73_m2} (ref >59)
GFR, EST NON AFRICAN AMERICAN: 80 mL/min/{1.73_m2} (ref >59)
GLOBULIN, TOTAL: 3.2 g/dL (ref 1.5–4.5)
Glucose: 105 mg/dL — ABNORMAL HIGH (ref 65–99)
POTASSIUM: 4.8 mmol/L (ref 3.5–5.2)
SODIUM: 139 mmol/L (ref 134–144)
Total Protein: 7.3 g/dL (ref 6.0–8.5)

## 2016-06-30 NOTE — Discharge Instructions (Signed)
**Note De-identified [place]ill Obfuscation** General Anesthesia, Adult, Care After °These instructions provide you with information about caring for yourself after your procedure. Your health care provider may also give you more specific instructions. Your treatment has been planned according to current medical practices, but problems sometimes occur. Call your health care provider if you have any problems or questions after your procedure. °What can I expect after the procedure? °After the procedure, it is common to have: °· Vomiting. °· A sore throat. °· Mental slowness. °It is common to feel: °· Nauseous. °· Cold or shivery. °· Sleepy. °· Tired. °· Sore or achy, even in parts of your body where you did not have surgery. °Follow these instructions at home: °For at least 24 hours after the procedure:  °· Do not: °¨ Participate in activities where you could fall or become injured. °¨ Drive. °¨ Use heavy machinery. °¨ Drink alcohol. °¨ Take sleeping pills or medicines that cause drowsiness. °¨ Make important decisions or sign legal documents. °¨ Take care of children on your own. °· Rest. °Eating and drinking  °· If you vomit, drink water, juice, or soup when you can drink without vomiting. °· Drink enough fluid to keep your urine clear or pale yellow. °· Make sure you have little or no nausea before eating solid foods. °· Follow the diet recommended by your health care provider. °General instructions  °· Have a responsible adult stay with you until you are awake and alert. °· Return to your normal activities as told by your health care provider. Ask your health care provider what activities are safe for you. °· Take over-the-counter and prescription medicines only as told by your health care provider. °· If you smoke, do not smoke without supervision. °· Keep all follow-up visits as told by your health care provider. This is important. °Contact a health care provider if: °· You continue to have nausea or vomiting at home, and medicines are not helpful. °· You  cannot drink fluids or start eating again. °· You cannot urinate after 8-12 hours. °· You develop a skin rash. °· You have fever. °· You have increasing redness at the site of your procedure. °Get help right away if: °· You have difficulty breathing. °· You have chest pain. °· You have unexpected bleeding. °· You feel that you are having a life-threatening or urgent problem. °This information is not intended to replace advice given to you by your health care provider. Make sure you discuss any questions you have with your health care provider. °Document Released: 11/06/2000 Document Revised: 01/03/2016 Document Reviewed: 07/15/2015 °Elsevier Interactive Patient Education © 2017 [employer] ° °

## 2016-07-03 ENCOUNTER — Encounter
Admission: RE | Disposition: A | Discharge status: Home / Self Care | Payer: Self-pay | Source: Ambulatory Visit | Attending: Gastroenterology

## 2016-07-03 ENCOUNTER — Ambulatory Visit: Payer: Medicare Other | Admitting: Anesthesiology

## 2016-07-03 ENCOUNTER — Ambulatory Visit
Admission: RE | Admit: 2016-07-03 | Discharge: 2016-07-03 | Disposition: A | Discharge status: Home / Self Care | Payer: Medicare Other | Source: Ambulatory Visit | Attending: Gastroenterology | Admitting: Gastroenterology

## 2016-07-03 DIAGNOSIS — D124 Benign neoplasm of descending colon: Secondary | ICD-10-CM | POA: Diagnosis not present

## 2016-07-03 DIAGNOSIS — Z8249 Family history of ischemic heart disease and other diseases of the circulatory system: Secondary | ICD-10-CM | POA: Insufficient documentation

## 2016-07-03 DIAGNOSIS — Z886 Allergy status to analgesic agent status: Secondary | ICD-10-CM | POA: Insufficient documentation

## 2016-07-03 DIAGNOSIS — Z9049 Acquired absence of other specified parts of digestive tract: Secondary | ICD-10-CM | POA: Insufficient documentation

## 2016-07-03 DIAGNOSIS — E8881 Metabolic syndrome: Secondary | ICD-10-CM | POA: Insufficient documentation

## 2016-07-03 DIAGNOSIS — D125 Benign neoplasm of sigmoid colon: Secondary | ICD-10-CM

## 2016-07-03 DIAGNOSIS — Z91010 Allergy to peanuts: Secondary | ICD-10-CM | POA: Diagnosis not present

## 2016-07-03 DIAGNOSIS — I10 Essential (primary) hypertension: Secondary | ICD-10-CM | POA: Diagnosis not present

## 2016-07-03 DIAGNOSIS — Z87442 Personal history of urinary calculi: Secondary | ICD-10-CM | POA: Insufficient documentation

## 2016-07-03 DIAGNOSIS — D120 Benign neoplasm of cecum: Secondary | ICD-10-CM | POA: Diagnosis not present

## 2016-07-03 DIAGNOSIS — R195 Other fecal abnormalities: Secondary | ICD-10-CM | POA: Diagnosis not present

## 2016-07-03 DIAGNOSIS — Z79899 Other long term (current) drug therapy: Secondary | ICD-10-CM | POA: Insufficient documentation

## 2016-07-03 DIAGNOSIS — E7800 Pure hypercholesterolemia, unspecified: Secondary | ICD-10-CM | POA: Diagnosis not present

## 2016-07-03 DIAGNOSIS — Z818 Family history of other mental and behavioral disorders: Secondary | ICD-10-CM | POA: Diagnosis not present

## 2016-07-03 DIAGNOSIS — Z880 Allergy status to penicillin: Secondary | ICD-10-CM | POA: Insufficient documentation

## 2016-07-03 DIAGNOSIS — Z882 Allergy status to sulfonamides status: Secondary | ICD-10-CM | POA: Insufficient documentation

## 2016-07-03 DIAGNOSIS — Z7982 Long term (current) use of aspirin: Secondary | ICD-10-CM | POA: Insufficient documentation

## 2016-07-03 DIAGNOSIS — Z825 Family history of asthma and other chronic lower respiratory diseases: Secondary | ICD-10-CM | POA: Diagnosis not present

## 2016-07-03 DIAGNOSIS — K635 Polyp of colon: Secondary | ICD-10-CM | POA: Diagnosis not present

## 2016-07-03 HISTORY — PX: POLYPECTOMY: SHX5525

## 2016-07-03 HISTORY — PX: COLONOSCOPY WITH PROPOFOL: SHX5780

## 2016-07-03 SURGERY — COLONOSCOPY WITH PROPOFOL
Anesthesia: Monitor Anesthesia Care | Wound class: Contaminated

## 2016-07-03 MED ORDER — ACETAMINOPHEN 160 MG/5ML PO SOLN: 325.0000 mg | ORAL | Status: DC | PRN

## 2016-07-03 MED ORDER — LIDOCAINE HCL (CARDIAC) 20 MG/ML IV SOLN
INTRAVENOUS | Status: DC | PRN
  Administered 2016-07-03: 50 mg via INTRAVENOUS

## 2016-07-03 MED ORDER — ACETAMINOPHEN 325 MG PO TABS: 325.0000 mg | ORAL_TABLET | ORAL | Status: DC | PRN

## 2016-07-03 MED ORDER — LACTATED RINGERS IV SOLN
INTRAVENOUS | Status: DC
  Administered 2016-07-03: 09:00:00 via INTRAVENOUS

## 2016-07-03 MED ORDER — STERILE WATER FOR IRRIGATION IR SOLN
Status: DC | PRN
  Administered 2016-07-03: 09:00:00

## 2016-07-03 MED ORDER — PROPOFOL 10 MG/ML IV BOLUS
INTRAVENOUS | Status: DC | PRN
  Administered 2016-07-03 (×6): 50 mg via INTRAVENOUS

## 2016-07-03 SURGICAL SUPPLY — 23 items
CANISTER SUCT 1200ML W/VALVE (MISCELLANEOUS) ×4 IMPLANT
CLIP HMST 235XBRD CATH ROT (MISCELLANEOUS) IMPLANT
CLIP RESOLUTION 360 11X235 (MISCELLANEOUS)
FCP ESCP3.2XJMB 240X2.8X (MISCELLANEOUS)
FORCEPS BIOP RAD 4 LRG CAP 4 (CUTTING FORCEPS) IMPLANT
FORCEPS BIOP RJ4 240 W/NDL (MISCELLANEOUS)
FORCEPS ESCP3.2XJMB 240X2.8X (MISCELLANEOUS) IMPLANT
GOWN CVR UNV OPN BCK APRN NK (MISCELLANEOUS) ×4 IMPLANT
GOWN ISOL THUMB LOOP REG UNIV (MISCELLANEOUS) ×4
INJECTOR VARIJECT VIN23 (MISCELLANEOUS) IMPLANT
KIT DEFENDO VALVE AND CONN (KITS) IMPLANT
KIT ENDO PROCEDURE OLY (KITS) ×4 IMPLANT
MARKER SPOT ENDO TATTOO 5ML (MISCELLANEOUS) IMPLANT
PAD GROUND ADULT SPLIT (MISCELLANEOUS) ×4 IMPLANT
PROBE APC STR FIRE (PROBE) IMPLANT
RETRIEVER NET ROTH 2.5X230 LF (MISCELLANEOUS) IMPLANT
SNARE SHORT THROW 13M SML OVAL (MISCELLANEOUS) ×4 IMPLANT
SNARE SHORT THROW 30M LRG OVAL (MISCELLANEOUS) IMPLANT
SNARE SNG USE RND 15MM (INSTRUMENTS) IMPLANT
SPOT EX ENDOSCOPIC TATTOO (MISCELLANEOUS)
TRAP ETRAP POLY (MISCELLANEOUS) ×4 IMPLANT
VARIJECT INJECTOR VIN23 (MISCELLANEOUS)
WATER STERILE IRR 250ML POUR (IV SOLUTION) ×4 IMPLANT

## 2016-07-03 NOTE — Transfer of Care (Signed)
**Note De-Identified [place] Obfuscation** Immediate Anesthesia Transfer of Care Note  Patient: Suzanne Jennings  Procedure(s) Performed: Procedure(s): COLONOSCOPY WITH PROPOFOL (N/A) POLYPECTOMY  Patient Location: PACU  Anesthesia Type: MAC  Level of Consciousness: awake, alert  and patient cooperative  Airway and Oxygen Therapy: Patient Spontanous Breathing and Patient connected to supplemental oxygen  Post-op Assessment: Post-op Vital signs reviewed, Patient's Cardiovascular Status Stable, Respiratory Function Stable, Patent Airway and No signs of Nausea or vomiting  Post-op Vital Signs: Reviewed and stable  Complications: No apparent anesthesia complications

## 2016-07-03 NOTE — Op Note (Signed)
**Note De-Identified Knipple Obfuscation** [facility] Patient Name: Suzanne Jennings Procedure Date: 07/03/2016 8:27 AM MRN: [id number] Account #: [id number] Date of Birth: [birth year] Admit Type: Outpatient Age: 65 Room: [facility] OR ROOM 01 Gender: Female Note Status: Finalized Procedure:            Colonoscopy Indications:          Positive Cologuard test Providers:            Lucilla Lame MD, MD Referring MD:         Kathrine Haddock (Referring MD) Medicines:            Propofol per Anesthesia Complications:        No immediate complications. Procedure:            Pre-Anesthesia Assessment:                       - Prior to the procedure, a History and Physical was                        performed, and patient medications and allergies were                        reviewed. The patient's tolerance of previous                        anesthesia was also reviewed. The risks and benefits of                        the procedure and the sedation options and risks were                        discussed with the patient. All questions were                        answered, and informed consent was obtained. Prior                        Anticoagulants: The patient has taken no previous                        anticoagulant or antiplatelet agents. ASA Grade                        Assessment: II - A patient with mild systemic disease.                        After reviewing the risks and benefits, the patient was                        deemed in satisfactory condition to undergo the                        procedure.                       After obtaining informed consent, the colonoscope was                        passed under direct vision. Throughout the procedure, **Note De-Identified Ertl Obfuscation** the patient's blood pressure, pulse, and oxygen                        saturations were monitored continuously. The was                        introduced through the anus and advanced to the the                        cecum,  identified by appendiceal orifice and ileocecal                        valve. The colonoscopy was performed without                        difficulty. The patient tolerated the procedure well.                        The quality of the bowel preparation was excellent. Findings:      The perianal and digital rectal examinations were normal.      A 10 mm polyp was found in the cecum. The polyp was sessile. The polyp       was removed with a cold snare. Resection and retrieval were complete.      A 5 mm polyp was found in the descending colon. The polyp was sessile.       The polyp was removed with a cold snare. Resection and retrieval were       complete.      A 8 mm polyp was found in the sigmoid colon. The polyp was pedunculated.       The polyp was removed with a hot snare. Resection and retrieval were       complete. Impression:           - One 10 mm polyp in the cecum, removed with a cold                        snare. Resected and retrieved.                       - One 5 mm polyp in the descending colon, removed with                        a cold snare. Resected and retrieved.                       - One 8 mm polyp in the sigmoid colon, removed with a                        hot snare. Resected and retrieved. Recommendation:       - Discharge patient to home.                       - Resume previous diet.                       - Continue present medications.                       - Await pathology results.                       - **Note De-Identified Dworkin Obfuscation** Repeat colonoscopy in 5 years if polyp adenoma and 10                        years if hyperplastic Procedure Code(s):    --- Professional ---                       [phone], Colonoscopy, flexible; with removal of tumor(s),                        polyp(s), or other lesion(s) by snare technique Diagnosis Code(s):    --- Professional ---                       R19.5, Other fecal abnormalities                       D12.5, Benign neoplasm of sigmoid colon                        D12.4, Benign neoplasm of descending colon                       D12.0, Benign neoplasm of cecum CPT copyright 2016 American Medical Association. All rights reserved. The codes documented in this report are preliminary and upon coder review may  be revised to meet current compliance requirements. Lucilla Lame MD, MD 07/03/2016 8:58:46 AM This report has been signed electronically. Number of Addenda: 0 Note Initiated On: 07/03/2016 8:27 AM Scope Withdrawal Time: 0 hours 10 minutes 55 seconds  Total Procedure Duration: 0 hours 14 minutes 22 seconds       [facility]

## 2016-07-03 NOTE — H&P (Signed)
**Note De-identified [place] Obfuscation**  **Note De-Identified Matherly Obfuscation** Suzanne Lame, MD [facility] [address] [place], [place] [address] Phone: [phone] Fax : [phone]  Primary Care Physician:  Kathrine Haddock, NP Primary Gastroenterologist:  Dr. Allen Norris  Pre-Procedure History & Physical: HPI:  Suzanne Jennings is a 65 y.o. female is here for an colonoscopy.   Past Medical History:  Diagnosis Date  . Kidney stone   . Knee pain     Past Surgical History:  Procedure Laterality Date  . APPENDECTOMY      Prior to Admission medications   Medication Sig Start Date End Date Taking? Authorizing Provider  aspirin EC 81 MG tablet Take 1 tablet (81 mg total) by mouth daily. 09/21/15  Yes Kathrine Haddock, NP  atorvastatin (LIPITOR) 10 MG tablet Take 10 mg by mouth daily.   Yes Historical Provider, MD  Probiotic Product (PROBIOTIC PO) Take 725 mg by mouth daily.   Yes Historical Provider, MD  spironolactone (ALDACTONE) 25 MG tablet Take 1 tablet (25 mg total) by mouth daily. 01/21/16  Yes Kathrine Haddock, NP  UNABLE TO FIND Take 1 tablet by mouth daily. BP Complete 120   Yes Historical Provider, MD  UNABLE TO FIND Take 1 capsule by mouth daily. Garcinia   Yes Historical Provider, MD    Allergies as of 05/05/2016 - Review Complete 05/05/2016  Allergen Reaction Noted  . Aspirin Other (See Comments) 09/21/2015  . Peanut-containing drug products  10/20/2015  . Penicillins  08/30/2015  . Sulfa antibiotics  08/30/2015    Family History  Problem Relation Age of Onset  . Emphysema Mother   . Heart disease Father     massive MI  . Hypertension Sister   . Mental illness Brother   . Hypertension Sister   . Hypertension Sister   . Breast cancer Neg Hx     Social History   Social History  . Marital status: Married    Spouse name: N/A  . Number of children: N/A  . Years of education: N/A   Occupational History  . Not on file.   Social History Main Topics  . Smoking status: Never Smoker  . Smokeless tobacco: Never Used  . Alcohol use No  . Drug use:  No  . Sexual activity: Yes   Other Topics Concern  . Not on file   Social History Narrative  . No narrative on file    Review of Systems: See HPI, otherwise negative ROS  Physical Exam: BP (!) 158/84   Pulse 88   Temp 97.2 F (36.2 C) (Temporal)   Wt 177 lb (80.3 kg)   LMP  (LMP Unknown)   SpO2 95%   BMI 33.33 kg/m  General:   Alert,  pleasant and cooperative in NAD Head:  Normocephalic and atraumatic. Neck:  Supple; no masses or thyromegaly. Lungs:  Clear throughout to auscultation.    Heart:  Regular rate and rhythm. Abdomen:  Soft, nontender and nondistended. Normal bowel sounds, without guarding, and without rebound.   Neurologic:  Alert and  oriented x4;  grossly normal neurologically.  Impression/Plan: Suzanne Jennings is here for an colonoscopy to be performed for positive cologaurd  Risks, benefits, limitations, and alternatives regarding  colonoscopy have been reviewed with the patient.  Questions have been answered.  All parties agreeable.   Suzanne Lame, MD  07/03/2016, 8:03 AM

## 2016-07-03 NOTE — Anesthesia Postprocedure Evaluation (Signed)
**Note De-Identified Reichelt Obfuscation** Anesthesia Post Note  Patient: Suzanne Jennings  Procedure(s) Performed: Procedure(s) (LRB): COLONOSCOPY WITH PROPOFOL (N/A) POLYPECTOMY  Patient location during evaluation: PACU Anesthesia Type: MAC Level of consciousness: awake and alert Pain management: pain level controlled Vital Signs Assessment: post-procedure vital signs reviewed and stable Respiratory status: spontaneous breathing, nonlabored ventilation, respiratory function stable and patient connected to nasal cannula oxygen Cardiovascular status: stable and blood pressure returned to baseline Anesthetic complications: no    Trecia Rogers

## 2016-07-03 NOTE — Anesthesia Procedure Notes (Signed)
**Note De-identified Nolley Obfuscation** Procedure Name: MAC Performed by: Veleka Djordjevic Pre-anesthesia Checklist: Patient identified, Emergency Drugs available, Suction available, Timeout performed and Patient being monitored Patient Re-evaluated:Patient Re-evaluated prior to inductionOxygen Delivery Method: Nasal cannula Placement Confirmation: positive ETCO2     

## 2016-07-03 NOTE — Anesthesia Preprocedure Evaluation (Signed)
**Note De-Identified Ammon Obfuscation** Anesthesia Evaluation  Patient identified by MRN, date of birth, ID band Patient awake    Reviewed: Allergy & Precautions, H&P , NPO status , Patient's Chart, lab work & pertinent test results, reviewed documented beta blocker date and time   Airway Mallampati: I  TM Distance: >3 FB Neck ROM: full    Dental no notable dental hx.    Pulmonary neg pulmonary ROS,    Pulmonary exam normal breath sounds clear to auscultation       Cardiovascular Exercise Tolerance: Good hypertension,  Rhythm:regular Rate:Normal  Hypercholesteremia and metabolic syndrome   Neuro/Psych negative neurological ROS  negative psych ROS   GI/Hepatic negative GI ROS, Neg liver ROS,   Endo/Other  negative endocrine ROS  Renal/GU negative Renal ROS  negative genitourinary   Musculoskeletal   Abdominal   Peds  Hematology negative hematology ROS (+)   Anesthesia Other Findings   Reproductive/Obstetrics negative OB ROS                             Anesthesia Physical Anesthesia Plan  ASA: II  Anesthesia Plan: MAC   Post-op Pain Management:    Induction:   Airway Management Planned:   Additional Equipment:   Intra-op Plan:   Post-operative Plan:   Informed Consent: I have reviewed the patients History and Physical, chart, labs and discussed the procedure including the risks, benefits and alternatives for the proposed anesthesia with the patient or authorized representative who has indicated his/her understanding and acceptance.   Dental Advisory Given  Plan Discussed with: CRNA and Anesthesiologist  Anesthesia Plan Comments:         Anesthesia Quick Evaluation

## 2016-07-04 ENCOUNTER — Encounter: Payer: Self-pay | Admitting: Gastroenterology

## 2016-07-09 ENCOUNTER — Encounter: Payer: Self-pay | Admitting: Gastroenterology

## 2016-07-17 ENCOUNTER — Other Ambulatory Visit: Payer: Self-pay

## 2016-07-18 MED ORDER — SPIRONOLACTONE 25 MG PO TABS: 25.0000 mg | ORAL_TABLET | Freq: Every day | ORAL | 1 refills | Status: DC

## 2016-07-18 MED ORDER — ATORVASTATIN CALCIUM 10 MG PO TABS: 10.0000 mg | ORAL_TABLET | Freq: Every day | ORAL | 1 refills | Status: DC

## 2016-09-25 ENCOUNTER — Encounter: Payer: Medicare Other | Admitting: Unknown Physician Specialty

## 2017-01-16 ENCOUNTER — Other Ambulatory Visit: Payer: Self-pay | Admitting: Family Medicine

## 2017-01-16 NOTE — Telephone Encounter (Signed)
**Note De-identified [place] Obfuscation** Your patient 

## 2017-01-17 ENCOUNTER — Telehealth: Payer: Self-pay | Admitting: Unknown Physician Specialty

## 2017-01-17 NOTE — Telephone Encounter (Signed)
**Note De-Identified Moller Obfuscation** Needs physical or office visits for further refills

## 2017-01-17 NOTE — Telephone Encounter (Signed)
**Note De-Identified Fakhouri Obfuscation** Patient needing refill for spironolactone (ALDACTONE) 25 mg tablet and atorvastatin (LIPITOR) 10 MG tablet. Patient tried picking up at the pharmacy but pharmacy did not have.   Please Advise.  Thank you

## 2017-01-17 NOTE — Telephone Encounter (Signed)
**Note De-Identified Wilemon Obfuscation** Called and let patient know that medications have been sent in for her as of this morning.

## 2017-01-17 NOTE — Telephone Encounter (Signed)
**Note De-Identified Angell Obfuscation** Patient has appointment scheduled for 03/12/17.

## 2017-02-28 ENCOUNTER — Ambulatory Visit: Payer: Medicare Other

## 2017-03-02 ENCOUNTER — Telehealth: Payer: Self-pay

## 2017-03-02 NOTE — Telephone Encounter (Signed)
**Note De-Identified Favata Obfuscation** Called to rechedule AWV- pt declined visit

## 2017-03-12 ENCOUNTER — Encounter: Payer: Self-pay | Admitting: Unknown Physician Specialty

## 2017-03-12 ENCOUNTER — Ambulatory Visit (INDEPENDENT_AMBULATORY_CARE_PROVIDER_SITE_OTHER): Payer: Medicare Other | Admitting: Unknown Physician Specialty

## 2017-03-12 VITALS — BP 135/80 | HR 74 | Temp 98.3°F | Ht 61.2 in | Wt 193.5 lb

## 2017-03-12 DIAGNOSIS — Z0000 Encounter for general adult medical examination without abnormal findings: Secondary | ICD-10-CM | POA: Diagnosis not present

## 2017-03-12 DIAGNOSIS — E7800 Pure hypercholesterolemia, unspecified: Secondary | ICD-10-CM | POA: Diagnosis not present

## 2017-03-12 DIAGNOSIS — I10 Essential (primary) hypertension: Secondary | ICD-10-CM | POA: Diagnosis not present

## 2017-03-12 DIAGNOSIS — E8881 Metabolic syndrome: Secondary | ICD-10-CM | POA: Diagnosis not present

## 2017-03-12 DIAGNOSIS — E669 Obesity, unspecified: Secondary | ICD-10-CM

## 2017-03-12 DIAGNOSIS — Z6836 Body mass index (BMI) 36.0-36.9, adult: Secondary | ICD-10-CM

## 2017-03-12 DIAGNOSIS — Z7189 Other specified counseling: Secondary | ICD-10-CM | POA: Insufficient documentation

## 2017-03-12 DIAGNOSIS — E119 Type 2 diabetes mellitus without complications: Secondary | ICD-10-CM | POA: Diagnosis not present

## 2017-03-12 DIAGNOSIS — IMO0001 Reserved for inherently not codable concepts without codable children: Secondary | ICD-10-CM

## 2017-03-12 LAB — BAYER DCA HB A1C WAIVED: HB A1C: 7.1 % — AB (ref <7.0)

## 2017-03-12 MED ORDER — SPIRONOLACTONE 25 MG PO TABS: 25.0000 mg | ORAL_TABLET | Freq: Every day | ORAL | 1 refills | Status: DC

## 2017-03-12 MED ORDER — ATORVASTATIN CALCIUM 10 MG PO TABS: 10.0000 mg | ORAL_TABLET | Freq: Every day | ORAL | 1 refills | Status: DC

## 2017-03-12 NOTE — Progress Notes (Signed)
**Note De-Identified Brittle Obfuscation** BP 135/80 (BP Location: Left Arm, Cuff Size: Large)   Pulse 74   Temp 98.3 F (36.8 C)   Ht 5' 1.2" (1.554 m)   Wt 193 lb 8 oz (87.8 kg)   LMP  (LMP Unknown)   SpO2 96%   BMI 36.32 kg/m    Subjective:    Patient ID: Suzanne Jennings, female    DOB: 01-13-1951, 66 y.o.   MRN: [id number]  HPI: Suzanne Jennings is a 66 y.o. female  Chief Complaint  Patient presents with  . Medicare Wellness   Functional Status Survey: Is the patient deaf or have difficulty hearing?: No Does the patient have difficulty seeing, even when wearing glasses/contacts?: No Does the patient have difficulty concentrating, remembering, or making decisions?: No Does the patient have difficulty walking or climbing stairs?: No Does the patient have difficulty dressing or bathing?: No Does the patient have difficulty doing errands alone such as visiting a doctor's office or shopping?: No  Fall Risk  03/12/2017 09/21/2015 08/30/2015  Falls in the past year? No No No   Depression screen [facility] 2/9 03/12/2017 09/21/2015 08/30/2015  Decreased Interest 0 0 0  Down, Depressed, Hopeless 0 0 0  PHQ - 2 Score 0 0 0  Altered sleeping 0 - -  Tired, decreased energy 0 - -  Change in appetite 0 - -  Feeling bad or failure about yourself  0 - -  Trouble concentrating 0 - -  Moving slowly or fidgety/restless 0 - -  Suicidal thoughts 0 - -  PHQ-9 Score 0 - -   Hypertension Using medications without difficulty Average home BPs Typically below 353 systolic   No problems or lightheadedness No chest pain with exertion or shortness of breath No Edema   Hyperlipidemia Using medications without problems: No Muscle aches  Diet compliance:Exercise: Exercising more as helps with arthtitic pains  The 10-year ASCVD risk score Mikey Bussing DC Jr., et al., 2013) is: 10.6%   Values used to calculate the score:     Age: 72 years     Sex: Female     Is Non-Hispanic African American: No     Diabetic: No     Tobacco smoker: No     Systolic  Blood Pressure: 135 mmHg     Is BP treated: Yes     HDL Cholesterol: 41 mg/dL     Total Cholesterol: 176 mg/dL   Social History   Social History  . Marital status: Married    Spouse name: N/A  . Number of children: N/A  . Years of education: N/A   Occupational History  . Not on file.   Social History Main Topics  . Smoking status: Never Smoker  . Smokeless tobacco: Never Used  . Alcohol use No  . Drug use: No  . Sexual activity: Yes   Other Topics Concern  . Not on file   Social History Narrative  . No narrative on file   Family History  Problem Relation Age of Onset  . Emphysema Mother   . Heart disease Father        massive MI  . Hypertension Sister   . Mental illness Brother   . Hypertension Sister   . Hypertension Sister   . Breast cancer Neg Hx    Past Medical History:  Diagnosis Date  . Kidney stone   . Knee pain    Past Surgical History:  Procedure Laterality Date  . APPENDECTOMY    . COLONOSCOPY **Note De-Identified Metzner Obfuscation** WITH PROPOFOL N/A 07/03/2016   Procedure: COLONOSCOPY WITH PROPOFOL;  Surgeon: Lucilla Lame, MD;  Location: [facility];  Service: Endoscopy;  Laterality: N/A;  . POLYPECTOMY  07/03/2016   Procedure: POLYPECTOMY;  Surgeon: Lucilla Lame, MD;  Location: [address];  Service: Endoscopy;;   Relevant past medical, surgical, family and social history reviewed and updated as indicated. Interim medical history since our last visit reviewed. Allergies and medications reviewed and updated.  Review of Systems  Constitutional: Negative.   HENT: Negative.   Eyes: Negative.   Respiratory: Negative.   Cardiovascular: Negative.   Gastrointestinal: Negative.   Endocrine: Negative.   Genitourinary: Negative.   Musculoskeletal:       Hip pain which improves with walking  Skin: Negative.   Allergic/Immunologic: Negative.   Neurological: Negative.   Hematological: Negative.   Psychiatric/Behavioral: Negative.     Per HPI unless specifically  indicated above     Objective:    BP 135/80 (BP Location: Left Arm, Cuff Size: Large)   Pulse 74   Temp 98.3 F (36.8 C)   Ht 5' 1.2" (1.554 m)   Wt 193 lb 8 oz (87.8 kg)   LMP  (LMP Unknown)   SpO2 96%   BMI 36.32 kg/m   Wt Readings from Last 3 Encounters:  03/12/17 193 lb 8 oz (87.8 kg)  07/03/16 177 lb (80.3 kg)  05/29/16 184 lb 3.2 oz (83.6 kg)    Physical Exam  Constitutional: She is oriented to person, place, and time. She appears well-developed and well-nourished.  HENT:  Head: Normocephalic and atraumatic.  Eyes: Pupils are equal, round, and reactive to light. Right eye exhibits no discharge. Left eye exhibits no discharge. No scleral icterus.  Neck: Normal range of motion. Neck supple. Carotid bruit is not present. No thyromegaly present.  Cardiovascular: Normal rate, regular rhythm and normal heart sounds.  Exam reveals no gallop and no friction rub.   No murmur heard. Pulmonary/Chest: Effort normal and breath sounds normal. No respiratory distress. She has no wheezes. She has no rales.  Abdominal: Soft. Bowel sounds are normal. There is no tenderness. There is no rebound.  Genitourinary: No breast swelling, tenderness or discharge.  Musculoskeletal: Normal range of motion.  Lymphadenopathy:    She has no cervical adenopathy.  Neurological: She is alert and oriented to person, place, and time.  Skin: Skin is warm, dry and intact. No rash noted.  Psychiatric: She has a normal mood and affect. Her speech is normal and behavior is normal. Judgment and thought content normal. Cognition and memory are normal.    Results for orders placed or performed in visit on 05/29/16  Comprehensive metabolic panel  Result Value Ref Range   Glucose 105 (H) 65 - 99 mg/dL   BUN 14 8 - 27 mg/dL   Creatinine, Ser 0.77 0.57 - 1.00 mg/dL   GFR calc non Af Amer 80 >59 mL/min/1.73   GFR calc Af Amer 92 >59 mL/min/1.73   BUN/Creatinine Ratio 18 12 - 28   Sodium 139 134 - 144 mmol/L    Potassium 4.8 3.5 - 5.2 mmol/L   Chloride 101 96 - 106 mmol/L   CO2 22 18 - 29 mmol/L   Calcium 9.8 8.7 - 10.3 mg/dL   Total Protein 7.3 6.0 - 8.5 g/dL   Albumin 4.1 3.6 - 4.8 g/dL   Globulin, Total 3.2 1.5 - 4.5 g/dL   Albumin/Globulin Ratio 1.3 1.2 - 2.2   Bilirubin Total 0.4 0.0 - **Note De-Identified Jacko Obfuscation** 1.2 mg/dL   Alkaline Phosphatase 57 39 - 117 IU/L   AST 21 0 - 40 IU/L   ALT 20 0 - 32 IU/L  Lipid Panel Piccolo, Waived  Result Value Ref Range   Cholesterol Piccolo, Waived 176 <200 mg/dL   HDL Chol Piccolo, Waived 41 (L) >59 mg/dL   Triglycerides Piccolo,Waived 298 (H) <150 mg/dL   Chol/HDL Ratio Piccolo,Waive 4.3 mg/dL   LDL Chol Calc Piccolo Waived 76 <100 mg/dL   VLDL Chol Calc Piccolo,Waive 60 (H) <30 mg/dL      Assessment & Plan:   Problem List Items Addressed This Visit      Unprioritized   Advanced care planning/counseling discussion    A voluntary discussion about advance care planning including the explanation and discussion of advance directives was discussed with the patient.   During this discussion, the patient was able to identify a health care proxy as her husband and has filled out the paperwork required.       Hypercholesteremia    Check lipid panel      Relevant Orders   Comprehensive metabolic panel   Lipid Panel w/o Chol/HDL Ratio   Hypertension    Stable less than goal of 130 while at home, continue present medications.        Relevant Orders   Comprehensive metabolic panel   Metabolic syndrome    Check Hgb A1C      Relevant Orders   Bayer DCA Hb A1c Waived   Obesity    Discussed diet changes.  Started to exercise.  She is planning on eating more vegetables and salads.         Other Visit Diagnoses    Annual physical exam    -  Primary       Follow up plan: Return in about 6 months (around 09/12/2017).

## 2017-03-12 NOTE — Assessment & Plan Note (Signed)
**Note De-identified Enochs Obfuscation** Check lipid panel  

## 2017-03-12 NOTE — Assessment & Plan Note (Signed)
**Note De-identified Hench Obfuscation** Check Hgb A1C 

## 2017-03-12 NOTE — Assessment & Plan Note (Addendum)
**Note De-Identified Colpitts Obfuscation** Stable less than goal of 130 while at home, continue present medications.

## 2017-03-12 NOTE — Assessment & Plan Note (Addendum)
**Note De-Identified Sendejo Obfuscation** Discussed diet changes.  Started to exercise.  She is planning on eating more vegetables and salads.

## 2017-03-12 NOTE — Assessment & Plan Note (Signed)
**Note De-Identified Poulter Obfuscation** A voluntary discussion about advance care planning including the explanation and discussion of advance directives was discussed with the patient.   During this discussion, the patient was able to identify a health care proxy as her husband and has filled out the paperwork required.

## 2017-03-13 ENCOUNTER — Telehealth: Payer: Self-pay | Admitting: Unknown Physician Specialty

## 2017-03-13 DIAGNOSIS — E119 Type 2 diabetes mellitus without complications: Secondary | ICD-10-CM

## 2017-03-13 DIAGNOSIS — E669 Obesity, unspecified: Secondary | ICD-10-CM | POA: Insufficient documentation

## 2017-03-13 DIAGNOSIS — E1169 Type 2 diabetes mellitus with other specified complication: Secondary | ICD-10-CM | POA: Insufficient documentation

## 2017-03-13 LAB — COMPREHENSIVE METABOLIC PANEL
ALBUMIN: 4.2 g/dL (ref 3.6–4.8)
ALK PHOS: 66 IU/L (ref 39–117)
ALT: 30 IU/L (ref 0–32)
AST: 27 IU/L (ref 0–40)
Albumin/Globulin Ratio: 1.7 (ref 1.2–2.2)
BUN/Creatinine Ratio: 15 (ref 12–28)
BUN: 12 mg/dL (ref 8–27)
Bilirubin Total: 0.4 mg/dL (ref 0.0–1.2)
CALCIUM: 9.5 mg/dL (ref 8.7–10.3)
CO2: 21 mmol/L (ref 20–29)
CREATININE: 0.82 mg/dL (ref 0.57–1.00)
Chloride: 102 mmol/L (ref 96–106)
GFR calc Af Amer: 86 mL/min/{1.73_m2} (ref >59)
GFR, EST NON AFRICAN AMERICAN: 74 mL/min/{1.73_m2} (ref >59)
Globulin, Total: 2.5 g/dL (ref 1.5–4.5)
Glucose: 157 mg/dL — ABNORMAL HIGH (ref 65–99)
Potassium: 4.7 mmol/L (ref 3.5–5.2)
Sodium: 137 mmol/L (ref 134–144)
Total Protein: 6.7 g/dL (ref 6.0–8.5)

## 2017-03-13 LAB — LIPID PANEL W/O CHOL/HDL RATIO
CHOLESTEROL TOTAL: 175 mg/dL (ref 100–199)
HDL: 33 mg/dL — ABNORMAL LOW (ref >39)
LDL CALC: 98 mg/dL (ref 0–99)
TRIGLYCERIDES: 222 mg/dL — AB (ref 0–149)
VLDL CHOLESTEROL CAL: 44 mg/dL — AB (ref 5–40)

## 2017-03-13 NOTE — Telephone Encounter (Signed)
**Note De-Identified Sumlin Obfuscation** Discuss with pt Hgb A1C is 7.1%  Will refer to the lifestyle center and recheck 3 months.

## 2017-03-14 NOTE — Telephone Encounter (Signed)
**Note De-identified Dettloff Obfuscation** Scheduled 3 month follow up

## 2017-03-29 ENCOUNTER — Encounter: Payer: Medicare Other | Attending: Unknown Physician Specialty | Admitting: *Deleted

## 2017-03-29 ENCOUNTER — Encounter: Payer: Self-pay | Admitting: *Deleted

## 2017-03-29 VITALS — BP 130/76 | Ht 61.0 in | Wt 182.4 lb

## 2017-03-29 DIAGNOSIS — E119 Type 2 diabetes mellitus without complications: Secondary | ICD-10-CM

## 2017-03-29 DIAGNOSIS — Z713 Dietary counseling and surveillance: Secondary | ICD-10-CM | POA: Diagnosis not present

## 2017-03-29 NOTE — Progress Notes (Signed)
**Note De-Identified Casaus Obfuscation** Diabetes Self-Management Education  Visit Type: First/Initial  Appt. Start Time: 1455 Appt. End Time: 1696  03/29/2017  [name], identified by name and date of birth, is a 66 y.o. female with a diagnosis of Diabetes: Type 2.   ASSESSMENT  Blood pressure 130/76, height 5\' 1"  (1.549 m), weight 182 lb 6.4 oz (82.7 kg). Body mass index is 34.46 kg/m.      Diabetes Self-Management Education - 03/29/17 1623      Visit Information   Visit Type First/Initial     Initial Visit   Diabetes Type Type 2   Are you currently following a meal plan? Yes   What type of meal plan do you follow? "no sodas, bread, sugar or salt"   Are you taking your medications as prescribed? Yes   Date Diagnosed Pt reports "borderline" - A1C done 03/12/17 with 7.1 %     Health Coping   How would you rate your overall health? Good     Psychosocial Assessment   Patient Belief/Attitude about Diabetes Motivated to manage diabetes Reports she is "probably in denial"   Self-care barriers None   Self-management support Doctor's office;Family   Patient Concerns Nutrition/Meal planning;Glycemic Control;Medication;Weight Control;Healthy Lifestyle   Special Needs None   Preferred Learning Style Auditory;Visual;Hands on   [place] in progress   How often do you need to have someone help you when you read instructions, pamphlets, or other written materials from your doctor or pharmacy? 1 - Never   What is the last grade level you completed in school? high school     Pre-Education Assessment   Patient understands the diabetes disease and treatment process. Needs Instruction   Patient understands incorporating nutritional management into lifestyle. Needs Instruction   Patient undertands incorporating physical activity into lifestyle. Needs Instruction   Patient understands using medications safely. Needs Instruction   Patient understands monitoring blood glucose, interpreting and using results  Needs Instruction   Patient understands prevention, detection, and treatment of acute complications. Needs Instruction   Patient understands prevention, detection, and treatment of chronic complications. Needs Instruction   Patient understands how to develop strategies to address psychosocial issues. Needs Instruction   Patient understands how to develop strategies to promote health/change behavior. Needs Instruction     Complications   Last HgB A1C per patient/outside source 7.1 %  03/12/17   How often do you check your blood sugar? 0 times/day (not testing)  Provided One Touch Verio Flex meter and instructed on use. BG upon return demonstration was 87 at 4:00 pm - 2 1/2 hrs pp.    Have you had a dilated eye exam in the past 12 months? No   Have you had a dental exam in the past 12 months? Yes   Are you checking your feet? No     Dietary Intake   Breakfast 1/2 piece of fruit or 2 eggs without yolk or cereal and little milk or oatmeal or cream of wheat   Lunch meat with peppers, onions, broccoli, cauliflower, carrots, zucchini, squash   Dinner meat and vegetables and sometimes fruit   Beverage(s) water     Exercise   Exercise Type Light (walking / raking leaves)   How many days per week to you exercise? 7   How many minutes per day do you exercise? 60   Total minutes per week of exercise 420     Patient Education   Previous Diabetes Education No   Disease state  Definition of **Note De-Identified Hudock Obfuscation** diabetes, type 1 and 2, and the diagnosis of diabetes   Nutrition management  Role of diet in the treatment of diabetes and the relationship between the three main macronutrients and blood glucose level;Reviewed blood glucose goals for pre and post meals and how to evaluate the patients' food intake on their blood glucose level.   Physical activity and exercise  Role of exercise on diabetes management, blood pressure control and cardiac health.   Monitoring Taught/evaluated SMBG meter.;Purpose and frequency of  SMBG.;Taught/discussed recording of test results and interpretation of SMBG.;Identified appropriate SMBG and/or A1C goals.   Chronic complications Relationship between chronic complications and blood glucose control   Psychosocial adjustment Identified and addressed patients feelings and concerns about diabetes     Individualized Goals (developed by patient)   Reducing Risk Improve blood sugars Decrease medications Lose weight Lead a healthier lifestyle     Outcomes   Expected Outcomes Demonstrated interest in learning. Expect positive outcomes   Future DMSE 2 months      Individualized Plan for Diabetes Self-Management Training:   Learning Objective:  Patient will have a greater understanding of diabetes self-management. Patient education plan is to attend individual and/or group sessions per assessed needs and concerns.   Plan:   Patient Instructions  Check blood sugars 1 x day before breakfast or 2 hrs after supper 3-4 x week Bring blood sugar records to the next class Call your doctor for a prescription for:  1. Meter strips (type)  One Touch Verio checking  3-4 times per week  2. Lancets (type)  One Touch Delica checking  3-4  times per week Exercise: Continue walking  for 60  minutes   7  days a week Eat 3 meals day,  1  snack a day Space meals 4-6 hours apart Include 1 serving of protein with breakfast   Expected Outcomes:  Demonstrated interest in learning. Expect positive outcomes  Education material provided:  General Meal Planning Guidelines Simple Meal Plan Meter = One Touch Verio Flex  If problems or questions, patient to contact team [place]:  Johny Drilling, [place], [place], CDE [phone]  Future DSME appointment: 2 months  May 17, 2017 for Diabetes Class 1

## 2017-03-29 NOTE — Patient Instructions (Addendum)
**Note De-Identified Claude Obfuscation** Check blood sugars 1 x day before breakfast or 2 hrs after supper 3-4 x week Bring blood sugar records to the next class  Call your doctor for a prescription for:  1. Meter strips (type)  One Touch Verio checking  3-4 times per week  2. Lancets (type)  One Touch Delica checking  3-4  times per week  Exercise: Continue walking  for 60  minutes   7  days a week  Eat 3 meals day,  1  snack a day Space meals 4-6 hours apart Include 1 serving of protein with breakfast

## 2017-04-02 ENCOUNTER — Telehealth: Payer: Self-pay | Admitting: Unknown Physician Specialty

## 2017-04-02 NOTE — Telephone Encounter (Signed)
**Note De-Identified Atallah Obfuscation** Patient needs one-touch verioflex blood glucose sticks to test blood #50 patient will be out of these after tomorrow morning.[facility]    [phone] [place] Head[place]

## 2017-04-02 NOTE — Telephone Encounter (Signed)
**Note De-Identified Faux Obfuscation** Called and let patient know that her prescription has been sent in to [facility] as requested.

## 2017-04-02 NOTE — Telephone Encounter (Signed)
**Note De-Identified Saini Obfuscation** Form filled out, signed by Malachy Mood, and faxed to [place].

## 2017-04-12 ENCOUNTER — Other Ambulatory Visit: Payer: Self-pay

## 2017-04-12 NOTE — Telephone Encounter (Signed)
**Note De-Identified Mciver Obfuscation** Patient has changed pharmacies and needs her medications sent to [facility].

## 2017-04-13 ENCOUNTER — Other Ambulatory Visit: Payer: Self-pay

## 2017-04-13 MED ORDER — ATORVASTATIN CALCIUM 10 MG PO TABS
10.0000 mg | ORAL_TABLET | Freq: Every day | ORAL | 1 refills | Status: DC
Start: 2017-04-13 — End: 2017-08-18

## 2017-04-13 MED ORDER — SPIRONOLACTONE 25 MG PO TABS: 25.0000 mg | ORAL_TABLET | Freq: Every day | ORAL | 1 refills | Status: DC

## 2017-04-13 NOTE — Telephone Encounter (Signed)
**Note De-identified Elsbury Obfuscation** Opened in error

## 2017-06-14 ENCOUNTER — Encounter: Payer: Self-pay | Admitting: Dietician

## 2017-06-14 ENCOUNTER — Encounter: Payer: Medicare Other | Attending: Unknown Physician Specialty | Admitting: Dietician

## 2017-06-14 VITALS — Ht 61.0 in | Wt 173.0 lb

## 2017-06-14 DIAGNOSIS — E119 Type 2 diabetes mellitus without complications: Secondary | ICD-10-CM | POA: Insufficient documentation

## 2017-06-14 DIAGNOSIS — Z713 Dietary counseling and surveillance: Secondary | ICD-10-CM | POA: Diagnosis not present

## 2017-06-14 NOTE — Progress Notes (Signed)
**Note De-identified Formanek Obfuscation** Appt. Start Time: 0900 Appt. End Time: 1200  Class 1 Diabetes Overview - define DM; state own type of DM; identify functions of pancreas and insulin; define insulin deficiency vs insulin resistance  Psychosocial - identify DM as a source of stress; state the effects of stress on BG control; verbalize appropriate stress management techniques; identify personal stress issues   Nutritional Management - describe effects of food on blood glucose; identify sources of carbohydrate, protein and fat; verbalize the importance of balance meals in controlling blood glucose; identify meals as well balanced or not; estimate servings of carbohydrate from menus; use food labels to identify servings size, content of carbohydrate, fiber, protein, fat, saturated fat and sodium; recognize food sources of fat, saturated fat, trans fat, sodium and verbalize goals for intake; describe healthful appropriate food choices when dining out   Exercise - describe the effects of exercise on blood glucose and importance of regular exercise in controlling diabetes; state a plan for personal exercise; verbalize contraindications for exercise  Self-Monitoring - state importance of HBGM and demo procedure accurately; use HBGM results to effectively manage diabetes; identify importance of regular HbA1C testing and goals for results  Acute Complications/Sick Day Guidelines - recognize hyperglycemia and hypoglycemia with causes and effects; identify blood glucose results as high, low or in control; list steps in treating and preventing high and low blood glucose; state appropriate measure to manage blood glucose when ill (need for meds, HBGM plan, when to call physician, need for fluids)  Chronic Complications/Foot, Skin, Eye Dental Care - identify possible long-term complications of diabetes (retinopathy, neuropathy, nephropathy, cardiovascular disease, infections); explain steps in prevention and treatment of chronic complications; state  importance of daily self-foot exams; describe how to examine feet and what to look for; explain appropriate eye and dental care  Lifestyle Changes/Goals & Health/Community Resources - state benefits of making appropriate lifestyle changes; identify habits that need to change (meals, tobacco, alcohol); identify strategies to reduce risk factors for personal health; set goals for proper diabetes care; state need for and frequency of healthcare follow-up; describe appropriate community resources for good health (ADA, web sites, apps)   Pregnancy/Sexual Health - define gestational diabetes; state importance of good blood glucose control and birth control prior to pregnancy; state importance of good blood glucose control in preventing sexual problems (impotence, vaginal dryness, infections, loss of desire); state relationship of blood glucose control and pregnancy outcome; describe risk of maternal and fetal complications  Teaching Materials Used: Class 1 Slides/Notebook Diabetes Booklet ID Card  Medic Alert/Medic ID Forms Sleep Evaluation Exercise Handout Daily Food Record Planning a Balanced Meal Goals for Class 1 

## 2017-06-21 ENCOUNTER — Encounter: Payer: Medicare Other | Admitting: Dietician

## 2017-06-21 VITALS — Wt 171.9 lb

## 2017-06-21 DIAGNOSIS — E119 Type 2 diabetes mellitus without complications: Secondary | ICD-10-CM

## 2017-06-21 DIAGNOSIS — Z713 Dietary counseling and surveillance: Secondary | ICD-10-CM | POA: Diagnosis not present

## 2017-06-21 NOTE — Progress Notes (Signed)
**Note De-identified Keltner Obfuscation** Appt. Start Time: 0900 Appt. End Time: 1200  Class 2 Nutritional Management - identify sources of carbohydrate, protein and fat; plan balanced meals; estimate servings of carbohydrates in meals  Psychosocial - identify DM as a source of stress; state the effects of stress on BG control  Exercise - describe the effects of exercise on blood glucose and importance of regular exercise in controlling diabetes; state a plan for personal exercise; verbalize contraindications for exercise  Self-Monitoring - state importance of SMBG; use SMBG results to effectively manage diabetes; identify importance of regular HbA1C testing and goals for results  Acute Complications - recognize hyperglycemia and hypoglycemia with causes and effects; identify blood glucose results as high, low or in control; list steps in treating and preventing high and low blood glucose  Sick Day Guidelines: state appropriate measure to manage blood glucose when ill (need for meds, HBGM plan, when to call physician, need for fluids)  Chronic Complications/Foot, Skin, Eye Dental Care - identify possible long-term complications of diabetes (retinopathy, neuropathy, nephropathy, cardiovascular disease, infections); explain steps in prevention and treatment of chronic complications; state importance of daily self-foot exams; describe how to examine feet and what to look for; explain appropriate eye and dental care  Lifestyle Changes/Goals - state benefits of making appropriate lifestyle changes; identify habits that need to change (meals, tobacco, alcohol); identify strategies to reduce risk factors for personal health  Pregnancy/Sexual Health - state importance of good blood glucose control in preventing sexual problems (impotence, vaginal dryness, infections, loss of desire)  Teaching Materials Used: Class 2 Slide Packet A1C Pamphlet Foot Care Literature Kidney Test Handout Stroke Card Quick and "Balanced" Meal Ideas Carb  Counting and Meal Planning Book Goals for Class 2 

## 2017-06-27 DIAGNOSIS — E119 Type 2 diabetes mellitus without complications: Secondary | ICD-10-CM | POA: Diagnosis not present

## 2017-06-27 LAB — HM DIABETES EYE EXAM

## 2017-06-28 ENCOUNTER — Encounter: Payer: Self-pay | Admitting: Dietician

## 2017-06-28 ENCOUNTER — Encounter: Payer: Medicare Other | Admitting: Dietician

## 2017-06-28 VITALS — Ht 61.0 in | Wt 169.7 lb

## 2017-06-28 DIAGNOSIS — Z713 Dietary counseling and surveillance: Secondary | ICD-10-CM | POA: Diagnosis not present

## 2017-06-28 DIAGNOSIS — E119 Type 2 diabetes mellitus without complications: Secondary | ICD-10-CM | POA: Diagnosis not present

## 2017-06-28 NOTE — Progress Notes (Signed)
**Note De-identified Tat Obfuscation** Appt. Start Time: 9:00am  Appt. End Time: 12:00  Class 3 Psychosocial - identify DM as a source of stress; state the effects of stress on BG control; verbalize appropriate stress management techniques; identify personal stress issues   Nutritional Management - describe effects of food on blood glucose; identify sources of carbohydrate, protein and fat; verbalize the importance of balance meals in controlling blood glucose; identify meals as well balanced or not; estimate servings of carbohydrate from menus; use food labels to identify servings size, content of carbohydrate, fiber, protein, fat, saturated fat and sodium; recognize food sources of fat, saturated fat, trans fat, sodium and verbalize goals for intake; describe healthful appropriate food choices when dining out   Exercise - describe the effects of exercise on blood glucose and importance of regular exercise in controlling diabetes; state a plan for personal exercise; verbalize contraindications for exercise  Self-Monitoring - state importance of HBGM and demo procedure accurately; use HBGM results to effectively manage diabetes; identify importance of regular HbA1C testing and goals for results  Acute Complications/Sick Day Guidelines - recognize hyperglycemia and hypoglycemia with causes and effects; identify blood glucose results as high, low or in control; list steps in treating and preventing high and low blood glucose; state appropriate measure to manage blood glucose when ill (need for meds, HBGM plan, when to call physician, need for fluids)  Chronic Complications/Foot, Skin, Eye Dental Care - identify possible long-term complications of diabetes (retinopathy, neuropathy, nephropathy, cardiovascular disease, infections); explain steps in prevention and treatment of chronic complications; state importance of daily self-foot exams; describe how to examine feet and what to look for; explain appropriate eye and dental care  Lifestyle  Changes/Goals & Health/Community Resources - state benefits of making appropriate lifestyle changes; identify habits that need to change (meals, tobacco, alcohol); identify strategies to reduce risk factors for personal health; set goals for proper diabetes care; state need for and frequency of healthcare follow-up; describe appropriate community resources for good health (ADA, web sites, apps)   Teaching Materials Used: Class 3 Slide Packet Diabetes Stress Test Stress Management Tools Stress Poem Recipe/Menu Booklet Nutrition Prescription Fast Food Information Goal Setting Worksheet    

## 2017-06-29 ENCOUNTER — Encounter: Payer: Self-pay | Admitting: Unknown Physician Specialty

## 2017-06-29 ENCOUNTER — Ambulatory Visit (INDEPENDENT_AMBULATORY_CARE_PROVIDER_SITE_OTHER): Payer: Medicare Other | Admitting: Unknown Physician Specialty

## 2017-06-29 VITALS — BP 127/74 | HR 65 | Temp 97.8°F | Wt 170.4 lb

## 2017-06-29 DIAGNOSIS — E119 Type 2 diabetes mellitus without complications: Secondary | ICD-10-CM

## 2017-06-29 DIAGNOSIS — I10 Essential (primary) hypertension: Secondary | ICD-10-CM

## 2017-06-29 DIAGNOSIS — L649 Androgenic alopecia, unspecified: Secondary | ICD-10-CM

## 2017-06-29 DIAGNOSIS — E7800 Pure hypercholesterolemia, unspecified: Secondary | ICD-10-CM

## 2017-06-29 LAB — LIPID PANEL PICCOLO, WAIVED
CHOL/HDL RATIO PICCOLO,WAIVE: 4.2 mg/dL
CHOLESTEROL PICCOLO, WAIVED: 169 mg/dL (ref <200)
HDL Chol Piccolo, Waived: 41 mg/dL — ABNORMAL LOW (ref >59)
LDL Chol Calc Piccolo Waived: 93 mg/dL (ref <100)
TRIGLYCERIDES PICCOLO,WAIVED: 179 mg/dL — AB (ref <150)
VLDL Chol Calc Piccolo,Waive: 36 mg/dL — ABNORMAL HIGH (ref <30)

## 2017-06-29 LAB — MICROALBUMIN, URINE WAIVED
Creatinine, Urine Waived: 50 mg/dL (ref 10–300)
Microalb, Ur Waived: 10 mg/L (ref 0–19)

## 2017-06-29 LAB — BAYER DCA HB A1C WAIVED: HB A1C: 5.8 % (ref <7.0)

## 2017-06-29 MED ORDER — ONETOUCH VERIO VI STRP: ORAL_STRIP | 12 refills | Status: DC

## 2017-06-29 NOTE — Assessment & Plan Note (Signed)
**Note De-Identified Flavell Obfuscation** Improved with Aldactone

## 2017-06-29 NOTE — Assessment & Plan Note (Addendum)
**Note De-Identified Ariel Obfuscation** Hgb A1C is 5.8 with just lifestyle changes.  Problem resolved.  Continue excellent diet and exercise changes.  Recheck in 6 months

## 2017-06-29 NOTE — Assessment & Plan Note (Signed)
**Note De-identified Pokorny Obfuscation** Stable, continue present medications.   

## 2017-06-29 NOTE — Assessment & Plan Note (Addendum)
**Note De-Identified Bourbeau Obfuscation** LDL is 93 which is at goal.  Continue current medications

## 2017-06-29 NOTE — Progress Notes (Signed)
**Note De-Identified Melody Obfuscation** BP 127/74   Pulse 65   Temp 97.8 F (36.6 C) (Oral)   Wt 170 lb 6.4 oz (77.3 kg)   LMP  (LMP Unknown)   SpO2 98%   BMI 32.20 kg/m    Subjective:    Patient ID: Suzanne Jennings, female    DOB: 04/11/1951, 66 y.o.   MRN: [id number]  HPI: Suzanne Jennings is a 66 y.o. female  Chief Complaint  Patient presents with  . Diabetes   Diabetes: Using medications without difficulties Going to classes No hypoglycemic episodes No hyperglycemic episodes Feet problems:none Blood Sugars averaging: 99-110 eye exam within last year Last Hgb A1C:7.1  Hypertension  Using medications without difficulty Average home BPs not checking   Using medication without problems or lightheadedness No chest pain with exertion or shortness of breath No Edema  Elevated Cholesterol Using medications without problems No Muscle aches  Diet: Exercise: Went to classes.  30 minutes twice a day of walking  Relevant past medical, surgical, family and social history reviewed and updated as indicated. Interim medical history since our last visit reviewed. Allergies and medications reviewed and updated.  Review of Systems  Per HPI unless specifically indicated above     Objective:    BP 127/74   Pulse 65   Temp 97.8 F (36.6 C) (Oral)   Wt 170 lb 6.4 oz (77.3 kg)   LMP  (LMP Unknown)   SpO2 98%   BMI 32.20 kg/m   Wt Readings from Last 3 Encounters:  06/29/17 170 lb 6.4 oz (77.3 kg)  06/28/17 169 lb 11.2 oz (77 kg)  06/21/17 171 lb 14.4 oz (78 kg)    Physical Exam  Constitutional: She is oriented to person, place, and time. She appears well-developed and well-nourished. No distress.  HENT:  Head: Normocephalic and atraumatic.  Eyes: Conjunctivae and lids are normal. Right eye exhibits no discharge. Left eye exhibits no discharge. No scleral icterus.  Neck: Normal range of motion. Neck supple. No JVD present. Carotid bruit is not present.  Cardiovascular: Normal rate, regular rhythm and normal  heart sounds.  Pulmonary/Chest: Effort normal and breath sounds normal.  Abdominal: Normal appearance. There is no splenomegaly or hepatomegaly.  Musculoskeletal: Normal range of motion.  Neurological: She is alert and oriented to person, place, and time.  Skin: Skin is warm, dry and intact. No rash noted. No pallor.  Psychiatric: She has a normal mood and affect. Her behavior is normal. Judgment and thought content normal.    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  Continue current medications      Relevant Orders   Lipid Panel Piccolo, Waived   Hypertension - Primary    Stable, continue present medications.        Relevant Orders   Comprehensive metabolic panel   Microalbumin, Urine Waived   Type 2 diabetes mellitus without complications (HCC)    Hgb A1C is 5.8 with just lifestyle changes.  Problem resolved.  Continue excellent diet and exercise changes.  Recheck in 6 months      Relevant Orders **Note De-Identified Rybolt Obfuscation** Comprehensive metabolic panel   Bayer DCA Hb A1c Waived       Follow up plan: Return in about 3 months (around 09/29/2017).

## 2017-06-30 LAB — COMPREHENSIVE METABOLIC PANEL
ALBUMIN: 4.5 g/dL (ref 3.6–4.8)
ALK PHOS: 70 IU/L (ref 39–117)
ALT: 26 IU/L (ref 0–32)
AST: 23 IU/L (ref 0–40)
Albumin/Globulin Ratio: 1.6 (ref 1.2–2.2)
BUN/Creatinine Ratio: 15 (ref 12–28)
BUN: 12 mg/dL (ref 8–27)
Bilirubin Total: 0.5 mg/dL (ref 0.0–1.2)
CO2: 26 mmol/L (ref 20–29)
CREATININE: 0.8 mg/dL (ref 0.57–1.00)
Calcium: 9.6 mg/dL (ref 8.7–10.3)
Chloride: 101 mmol/L (ref 96–106)
GFR calc Af Amer: 88 mL/min/{1.73_m2} (ref >59)
GFR, EST NON AFRICAN AMERICAN: 76 mL/min/{1.73_m2} (ref >59)
GLOBULIN, TOTAL: 2.9 g/dL (ref 1.5–4.5)
Glucose: 97 mg/dL (ref 65–99)
POTASSIUM: 4.4 mmol/L (ref 3.5–5.2)
SODIUM: 142 mmol/L (ref 134–144)
TOTAL PROTEIN: 7.4 g/dL (ref 6.0–8.5)

## 2017-07-02 ENCOUNTER — Encounter: Payer: Self-pay | Admitting: Unknown Physician Specialty

## 2017-07-02 ENCOUNTER — Encounter: Payer: Self-pay | Admitting: *Deleted

## 2017-08-18 ENCOUNTER — Other Ambulatory Visit: Payer: Self-pay | Admitting: Unknown Physician Specialty

## 2017-09-12 ENCOUNTER — Ambulatory Visit: Payer: Medicare Other | Admitting: Unknown Physician Specialty

## 2017-10-02 ENCOUNTER — Ambulatory Visit: Payer: Medicare Other | Admitting: Unknown Physician Specialty

## 2017-11-19 ENCOUNTER — Telehealth: Payer: Self-pay | Admitting: Unknown Physician Specialty

## 2017-11-19 NOTE — Telephone Encounter (Signed)
**Note De-Identified Bolduc Obfuscation** Copied from [place] [id number]. Topic: Medicare AWV >> Nov 19, 2017  3:23 PM Leo Rod wrote: Hulen Skains to schedule AWV with Nurse Health Advisor. Last AWV on 03/12/17 please schedule AWV with [facility] any date after 03/12/18  For questions please contact: Jill Alexanders [phone]  Skype [email or web address]

## 2017-11-26 ENCOUNTER — Ambulatory Visit: Payer: Medicare Other | Admitting: Unknown Physician Specialty

## 2017-12-10 ENCOUNTER — Encounter: Payer: Self-pay | Admitting: Unknown Physician Specialty

## 2017-12-10 ENCOUNTER — Ambulatory Visit (INDEPENDENT_AMBULATORY_CARE_PROVIDER_SITE_OTHER): Payer: Medicare Other | Admitting: Unknown Physician Specialty

## 2017-12-10 DIAGNOSIS — E119 Type 2 diabetes mellitus without complications: Secondary | ICD-10-CM

## 2017-12-10 DIAGNOSIS — L649 Androgenic alopecia, unspecified: Secondary | ICD-10-CM | POA: Diagnosis not present

## 2017-12-10 DIAGNOSIS — N950 Postmenopausal bleeding: Secondary | ICD-10-CM | POA: Diagnosis not present

## 2017-12-10 DIAGNOSIS — I10 Essential (primary) hypertension: Secondary | ICD-10-CM | POA: Diagnosis not present

## 2017-12-10 DIAGNOSIS — E7800 Pure hypercholesterolemia, unspecified: Secondary | ICD-10-CM

## 2017-12-10 NOTE — Assessment & Plan Note (Addendum)
**Note De-Identified Mccannon Obfuscation** Diet controlled.  Check Hgb A1C next visit

## 2017-12-10 NOTE — Assessment & Plan Note (Signed)
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

## 2017-12-10 NOTE — Progress Notes (Signed)
**Note De-Identified Rhodes Obfuscation** BP 134/84   Pulse 75   Temp 98.1 F (36.7 C) (Oral)   Ht 5\' 1"  (1.549 m)   Wt 183 lb 9.6 oz (83.3 kg)   LMP  (LMP Unknown)   SpO2 97%   BMI 34.69 kg/m    Subjective:    Patient ID: Suzanne Jennings, female    DOB: [birth year], 67 y.o.   MRN: [id number]  HPI: Suzanne Jennings is a 67 y.o. female  Chief Complaint  Patient presents with  . Diabetes  . Hyperlipidemia  . Hypertension   Diabetes: Using medications without difficulties No hypoglycemic episodes  No hyperglycemic episodes  Feet problems: none Blood Sugars averaging: Highest is 126 eye exam within last year Last Hgb A1C: 5.8  Hypertension  Using medications without difficulty Average home BPs  Not checking   Using medication without problems or lightheadedness No chest pain with exertion or shortness of breath No Edema  Elevated Cholesterol Using medications without problems No Muscle aches  Diet: Exercise:continues to work on her diet and exercising.    Vaginal bleeding Light bleeding for the last 3 weeks.  Not every day but comes and goes.  No cramping  Relevant past medical, surgical, family and social history reviewed and updated as indicated. Interim medical history since our last visit reviewed. Allergies and medications reviewed and updated.  Review of Systems  Per HPI unless specifically indicated above     Objective:    BP 134/84   Pulse 75   Temp 98.1 F (36.7 C) (Oral)   Ht 5\' 1"  (1.549 m)   Wt 183 lb 9.6 oz (83.3 kg)   LMP  (LMP Unknown)   SpO2 97%   BMI 34.69 kg/m   Wt Readings from Last 3 Encounters:  12/10/17 183 lb 9.6 oz (83.3 kg)  06/29/17 170 lb 6.4 oz (77.3 kg)  06/28/17 169 lb 11.2 oz (77 kg)    Physical Exam  Constitutional: She is oriented to person, place, and time. She appears well-developed and well-nourished. No distress.  HENT:  Head: Normocephalic and atraumatic.  Eyes: Conjunctivae and lids are normal. Right eye exhibits no discharge. Left eye exhibits no  discharge. No scleral icterus.  Neck: Normal range of motion. Neck supple. No JVD present. Carotid bruit is not present.  Cardiovascular: Normal rate, regular rhythm and normal heart sounds.  Pulmonary/Chest: Effort normal and breath sounds normal.  Abdominal: Normal appearance. There is no splenomegaly or hepatomegaly.  Musculoskeletal: Normal range of motion.  Neurological: She is alert and oriented to person, place, and time.  Skin: Skin is warm, dry and intact. No rash noted. No pallor.  Psychiatric: She has a normal mood and affect. Her behavior is normal. Judgment and thought content normal.    Results for orders placed or performed in visit on 06/29/17  Comprehensive metabolic panel  Result Value Ref Range   Glucose 97 65 - 99 mg/dL   BUN 12 8 - 27 mg/dL   Creatinine, Ser 0.80 0.57 - 1.00 mg/dL   GFR calc non Af Amer 76 >59 mL/min/1.73   GFR calc Af Amer 88 >59 mL/min/1.73   BUN/Creatinine Ratio 15 12 - 28   Sodium 142 134 - 144 mmol/L   Potassium 4.4 3.5 - 5.2 mmol/L   Chloride 101 96 - 106 mmol/L   CO2 26 20 - 29 mmol/L   Calcium 9.6 8.7 - 10.3 mg/dL   Total Protein 7.4 6.0 - 8.5 g/dL   Albumin 4.5 3.6 - **Note De-Identified Wedekind Obfuscation** 4.8 g/dL   Globulin, Total 2.9 1.5 - 4.5 g/dL   Albumin/Globulin Ratio 1.6 1.2 - 2.2   Bilirubin Total 0.5 0.0 - 1.2 mg/dL   Alkaline Phosphatase 70 39 - 117 IU/L   AST 23 0 - 40 IU/L   ALT 26 0 - 32 IU/L  Bayer DCA Hb A1c Waived  Result Value Ref Range   Bayer DCA Hb A1c Waived 5.8 <7.0 %  Lipid Panel Piccolo, Waived  Result Value Ref Range   Cholesterol Piccolo, Waived 169 <200 mg/dL   HDL Chol Piccolo, Waived 41 (L) >59 mg/dL   Triglycerides Piccolo,Waived 179 (H) <150 mg/dL   Chol/HDL Ratio Piccolo,Waive 4.2 mg/dL   LDL Chol Calc Piccolo Waived 93 <100 mg/dL   VLDL Chol Calc Piccolo,Waive 36 (H) <30 mg/dL  Microalbumin, Urine Waived  Result Value Ref Range   Microalb, Ur Waived 10 0 - 19 mg/L   Creatinine, Urine Waived 50 10 - 300 mg/dL   Microalb/Creat  Ratio 30-300 (H) <30 mg/g  HM DIABETES EYE EXAM  Result Value Ref Range   HM Diabetic Eye Exam No Retinopathy No Retinopathy      Assessment & Plan:   Problem List Items Addressed This Visit      Unprioritized   Alopecia, 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  Continue present       Hypertension    Stable, continue present medications.        Post-menopausal bleeding    For 3 weeks.  Refer to gyn      Relevant Orders   Ambulatory referral to Gynecology   Type 2 diabetes mellitus without complications ([place])    Diet controlled.  Check Hgb A1C next visit          Follow up plan: Return in about 6 months (around 06/11/2018) for physical.

## 2017-12-10 NOTE — Assessment & Plan Note (Signed)
**Note De-Identified Christoffersen Obfuscation** For 3 weeks.  Refer to gyn

## 2017-12-10 NOTE — Assessment & Plan Note (Addendum)
**Note De-Identified Joubert Obfuscation** Lipids stable last visit.  Continue present

## 2017-12-10 NOTE — Assessment & Plan Note (Signed)
**Note De-identified Peckinpaugh Obfuscation** Stable, continue present medications.   

## 2018-01-16 ENCOUNTER — Encounter: Payer: Medicare Other | Admitting: Obstetrics and Gynecology

## 2018-01-17 ENCOUNTER — Ambulatory Visit: Payer: Medicare Other | Admitting: Obstetrics and Gynecology

## 2018-01-17 ENCOUNTER — Encounter: Payer: Self-pay | Admitting: Obstetrics and Gynecology

## 2018-01-17 VITALS — BP 147/84 | HR 80 | Ht 61.0 in | Wt 187.4 lb

## 2018-01-17 DIAGNOSIS — N950 Postmenopausal bleeding: Secondary | ICD-10-CM | POA: Diagnosis not present

## 2018-01-17 DIAGNOSIS — C541 Malignant neoplasm of endometrium: Secondary | ICD-10-CM | POA: Diagnosis not present

## 2018-01-17 DIAGNOSIS — N840 Polyp of corpus uteri: Secondary | ICD-10-CM | POA: Diagnosis not present

## 2018-01-17 NOTE — Progress Notes (Signed)
**Note De-Identified Uhde Obfuscation** Pt is present today due to postmenopause bleeding. Pt stated that it started 3 weeks ago, bleeding is light and is not everyday, but it comes and goes with no cramping.

## 2018-01-17 NOTE — Progress Notes (Signed)
**Note De-Identified Sentell Obfuscation** HPI:      [name] is a 67 y.o. [phone] who LMP was No LMP recorded (lmp unknown). Patient is postmenopausal.  Subjective:   She presents today with complaint of approximately 3 weeks of vaginal bleeding.  She reports it is not every day and it is mostly just spotting.  Has never become as heavy as a menstrual period. She reports that she has not had a Pap smear in more than 8 years.  Denies any history of abnormals. She has had 2 prior vaginal births. She reports that her menses have been irregular her whole life and often were 3 months apart.    Hx: The following portions of the patient's history were reviewed and updated as appropriate:             She  has a past medical history of Diabetes mellitus without complication ([place]), Hyperlipidemia, Kidney stone, and Knee pain. She does not have any pertinent problems on file. She  has a past surgical history that includes Appendectomy; Colonoscopy with propofol (N/A, 07/03/2016); polypectomy (07/03/2016); and Tubal ligation. Her family history includes Diabetes in her maternal grandmother; Emphysema in her mother; Heart disease in her father; Hypertension in her sister, sister, and sister; Mental illness in her brother. She  reports that she has never smoked. She has never used smokeless tobacco. She reports that she does not drink alcohol or use drugs. She has a current medication list which includes the following prescription(s): aspirin ec, atorvastatin, onetouch verio, probiotic product, and spironolactone. She is allergic to peanut-containing drug products; aspirin; other; penicillins; and sulfa antibiotics.       Review of Systems:  Review of Systems  Constitutional: Denied constitutional symptoms, night sweats, recent illness, fatigue, fever, insomnia and weight loss.  Eyes: Denied eye symptoms, eye pain, photophobia, vision change and visual disturbance.  Ears/Nose/Throat/Neck: Denied ear, nose, throat or neck symptoms, hearing  loss, nasal discharge, sinus congestion and sore throat.  Cardiovascular: Denied cardiovascular symptoms, arrhythmia, chest pain/pressure, edema, exercise intolerance, orthopnea and palpitations.  Respiratory: Denied pulmonary symptoms, asthma, pleuritic pain, productive sputum, cough, dyspnea and wheezing.  Gastrointestinal: Denied, gastro-esophageal reflux, melena, nausea and vomiting.  Genitourinary: See HPI for additional information.  Musculoskeletal: Denied musculoskeletal symptoms, stiffness, swelling, muscle weakness and myalgia.  Dermatologic: Denied dermatology symptoms, rash and scar.  Neurologic: Denied neurology symptoms, dizziness, headache, neck pain and syncope.  Psychiatric: Denied psychiatric symptoms, anxiety and depression.  Endocrine: Denied endocrine symptoms including hot flashes and night sweats.   Meds:   Current Outpatient Medications on File Prior to Visit  Medication Sig Dispense Refill  . aspirin EC 81 MG tablet Take 1 tablet (81 mg total) by mouth daily. 90 tablet 3  . atorvastatin (LIPITOR) 10 MG tablet TAKE 1 TABLET BY MOUTH  DAILY 90 tablet 1  . ONETOUCH VERIO test strip One Touch Verio- pt states she checks her sugar everyday 100 each 12  . Probiotic Product (PROBIOTIC PO) Take 725 mg by mouth daily.    Marland Kitchen spironolactone (ALDACTONE) 25 MG tablet TAKE 1 TABLET BY MOUTH  DAILY (Patient not taking: Reported on 01/17/2018) 90 tablet 1   No current facility-administered medications on file prior to visit.     Objective:     Vitals:   01/17/18 0956  BP: (!) 147/84  Pulse: 80              Physical examination   Pelvic:   Vulva: Normal appearance.  No lesions.  Vagina: No **Note De-Identified Mutchler Obfuscation** lesions or abnormalities noted.  Mild vaginal atrophy noted    Support: Normal pelvic support.  Urethra No masses tenderness or scarring.  Meatus Normal size without lesions or prolapse.  Cervix: Normal appearance.  No lesions.  Anus: Normal exam.  No lesions.  Perineum: Normal exam.   No lesions.        Bimanual   Uterus: Normal size.  Non-tender.  Mobile.  AV.  Adnexae: No masses.  Non-tender to palpation.  Cul-de-sac: Negative for abnormality.   Endometrial Biopsy After discussion with the patient regarding her abnormal uterine bleeding I recommended that she proceed with an endometrial biopsy for further diagnosis. The risks, benefits, alternatives, and indications for an endometrial biopsy were discussed with the patient in detail. She understood the risks including infection, bleeding, cervical laceration and uterine perforation.  Verbal consent was obtained.   PROCEDURE NOTE:  Vacurette endometrial biopsy was performed using aseptic technique with iodine preparation.  The uterus was sounded to a length of 8 cm.  Adequate sampling was obtained with minimal blood loss.  The patient tolerated the procedure well.  Disposition will be pending pathology   Assessment:    [id number] Patient Active Problem List   Diagnosis Date Noted  . Post-menopausal bleeding 12/10/2017  . Type 2 diabetes mellitus without complications ([place]) 11/94/1740  . Advanced care planning/counseling discussion 03/12/2017  . Benign neoplasm of descending colon   . Polyp of sigmoid colon   . Benign neoplasm of cecum   . Metabolic syndrome 81/44/8185  . 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 Hypercholesteremia 09/21/2015  . Hypertension 08/30/2015  . Osteoarthritis (arthritis due to wear and tear of joints) 08/30/2015  . Obesity 08/30/2015     1. Post-menopause bleeding        Plan:            1.  Await endometrial biopsy results and discuss management when they return. Orders No orders of the defined types were placed in this encounter.   No orders of the defined types were placed in this encounter.     F/U  Return in about 1 week (around 01/24/2018). I spent 32 minutes involved in the care of this patient of which greater than 50% was spent discussing postmenopausal bleeding, vaginal  atrophy, Pap smears and timing, patient's history of irregular menses, pregnancy history, option of ultrasound management etc.  Finis Bud, M.D. 01/17/2018 10:53 AM

## 2018-01-17 NOTE — Addendum Note (Signed)
**Note De-Identified Maina Obfuscation** Addended by: Edwyna Shell on: 01/17/2018 11:15 AM   Modules accepted: Orders

## 2018-01-21 LAB — IGP, COBASHPV16/18
HPV 16: NEGATIVE
HPV 18: NEGATIVE
HPV OTHER HR TYPES: NEGATIVE
PAP SMEAR COMMENT: 0

## 2018-01-22 ENCOUNTER — Telehealth: Payer: Self-pay | Admitting: Obstetrics and Gynecology

## 2018-01-22 LAB — PATHOLOGY

## 2018-01-22 NOTE — Telephone Encounter (Signed)
**Note De-Identified Newbrough Obfuscation** Dr. Berton Lan called asking to speak to a nurse or Dr. Amalia Hailey; no one was available at the time when I checked all the extensions.  She has a result for the patient on her endometrial biopsy from 01/17/18.  She will be faxing the results and said to feel free to contact her if you have any questions Thank you

## 2018-01-22 NOTE — Telephone Encounter (Signed)
**Note De-Identified Brickner Obfuscation** DJE is aware.

## 2018-01-24 ENCOUNTER — Ambulatory Visit (INDEPENDENT_AMBULATORY_CARE_PROVIDER_SITE_OTHER): Payer: Medicare Other | Admitting: Obstetrics and Gynecology

## 2018-01-24 ENCOUNTER — Encounter: Payer: Self-pay | Admitting: Obstetrics and Gynecology

## 2018-01-24 VITALS — BP 145/73 | HR 80 | Ht 61.0 in | Wt 187.8 lb

## 2018-01-24 DIAGNOSIS — C541 Malignant neoplasm of endometrium: Secondary | ICD-10-CM | POA: Diagnosis not present

## 2018-01-24 NOTE — Progress Notes (Signed)
**Note De-Identified Schwartzman Obfuscation** HPI:      [name] is a 67 y.o. [phone] who LMP was No LMP recorded (lmp unknown). Patient is postmenopausal.  Subjective:   She presents today to discuss her endometrial biopsy.  She says that she has not had any bleeding in the last 2 days prior to that has had daily light vaginal bleeding. Previous abdominal surgeries include appendectomy for ruptured appendix and tubal ligation.    Hx: The following portions of the patient's history were reviewed and updated as appropriate:             She  has a past medical history of Diabetes mellitus without complication ([place]), Hyperlipidemia, Kidney stone, and Knee pain. She does not have any pertinent problems on file. She  has a past surgical history that includes Appendectomy; Colonoscopy with propofol (N/A, 07/03/2016); polypectomy (07/03/2016); and Tubal ligation. Her family history includes Diabetes in her maternal grandmother; Emphysema in her mother; Heart disease in her father; Hypertension in her sister, sister, and sister; Mental illness in her brother. She  reports that she has never smoked. She has never used smokeless tobacco. She reports that she does not drink alcohol or use drugs. She has a current medication list which includes the following prescription(s): aspirin ec, atorvastatin, onetouch verio, probiotic product, and spironolactone. She is allergic to peanut-containing drug products; aspirin; other; penicillins; and sulfa antibiotics.       Review of Systems:  Review of Systems  Constitutional: Denied constitutional symptoms, night sweats, recent illness, fatigue, fever, insomnia and weight loss.  Eyes: Denied eye symptoms, eye pain, photophobia, vision change and visual disturbance.  Ears/Nose/Throat/Neck: Denied ear, nose, throat or neck symptoms, hearing loss, nasal discharge, sinus congestion and sore throat.  Cardiovascular: Denied cardiovascular symptoms, arrhythmia, chest pain/pressure, edema, exercise  intolerance, orthopnea and palpitations.  Respiratory: Denied pulmonary symptoms, asthma, pleuritic pain, productive sputum, cough, dyspnea and wheezing.  Gastrointestinal: Denied, gastro-esophageal reflux, melena, nausea and vomiting.  Genitourinary: See HPI for additional information.  Musculoskeletal: Denied musculoskeletal symptoms, stiffness, swelling, muscle weakness and myalgia.  Dermatologic: Denied dermatology symptoms, rash and scar.  Neurologic: Denied neurology symptoms, dizziness, headache, neck pain and syncope.  Psychiatric: Denied psychiatric symptoms, anxiety and depression.  Endocrine: Denied endocrine symptoms including hot flashes and night sweats.   Meds:   Current Outpatient Medications on File Prior to Visit  Medication Sig Dispense Refill  . aspirin EC 81 MG tablet Take 1 tablet (81 mg total) by mouth daily. 90 tablet 3  . atorvastatin (LIPITOR) 10 MG tablet TAKE 1 TABLET BY MOUTH  DAILY 90 tablet 1  . ONETOUCH VERIO test strip One Touch Verio- pt states she checks her sugar everyday 100 each 12  . Probiotic Product (PROBIOTIC PO) Take 725 mg by mouth daily.    Marland Kitchen spironolactone (ALDACTONE) 25 MG tablet TAKE 1 TABLET BY MOUTH  DAILY 90 tablet 1   No current facility-administered medications on file prior to visit.     Objective:     Vitals:   01/24/18 0826  BP: (!) 145/73  Pulse: 80              Endometrial biopsy results discussed and reviewed in detail.  Grade discussed.  Assessment:    [id number] Patient Active Problem List   Diagnosis Date Noted  . Post-menopausal bleeding 12/10/2017  . Type 2 diabetes mellitus without complications ([place]) 53/61/4431  . Advanced care planning/counseling discussion 03/12/2017  . Benign neoplasm of descending colon   . Polyp of **Note De-Identified Niswander Obfuscation** sigmoid colon   . Benign neoplasm of cecum   . Metabolic syndrome 29/09/1113  . 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 Hypercholesteremia 09/21/2015  . Hypertension 08/30/2015  .  Osteoarthritis (arthritis due to wear and tear of joints) 08/30/2015  . Obesity 08/30/2015     1. Endometrial adenocarcinoma ([place])     FIGO 1   Plan:            1.  Referral to GYN oncology. Orders No orders of the defined types were placed in this encounter.   No orders of the defined types were placed in this encounter.     F/U  Return for As Scheduled Post-op. I spent 17 minutes involved in the care of this patient of which greater than 50% was spent discussing endometrial cancer.  I discussed the continued work-up the possible types of surgery and the subsequent follow-up for endometrial cancer.  I have explained to her the high likelihood that this will be cured with surgery.  All of her questions were answered. Finis Bud, M.D. 01/24/2018 8:43 AM

## 2018-01-30 ENCOUNTER — Inpatient Hospital Stay: Payer: Medicare Other | Attending: Obstetrics and Gynecology | Admitting: Obstetrics and Gynecology

## 2018-01-30 VITALS — BP 143/77 | HR 75 | Temp 98.3°F | Resp 18 | Ht 61.0 in | Wt 189.2 lb

## 2018-01-30 DIAGNOSIS — C541 Malignant neoplasm of endometrium: Secondary | ICD-10-CM

## 2018-01-30 NOTE — Patient Instructions (Addendum)
**Note De-Identified Afonso Obfuscation** Your surgery will be scheduled for 02/20/18. We will contact you with your pre-admit testing appointment    Laparoscopy Laparoscopy is a procedure to diagnose diseases in the abdomen. During the procedure, a thin, lighted, pencil-sized instrument called a laparoscope is inserted into the abdomen through an incision. The laparoscope allows your health care provider to look at the organs inside your body. LET [facility] CARE PROVIDER KNOW ABOUT:  Any allergies you have.  All medicines you are taking, including vitamins, herbs, eye drops, creams, and over-the-counter medicines.  Previous problems you or members of your family have had with the use of anesthetics.  Any blood disorders you have.  Previous surgeries you have had.  Medical conditions you have. RISKS AND COMPLICATIONS  Generally, this is a safe procedure. However, problems can occur, which may include:  Infection.  Bleeding.  Damage to other organs.  Allergic reaction to the anesthetics used during the procedure. BEFORE THE PROCEDURE  Do not eat or drink anything after midnight on the night before the procedure or as directed by your health care provider.  Ask your health care provider about: ? Changing or stopping your regular medicines. ? Taking medicines such as aspirin and ibuprofen. These medicines can thin your blood. Do not take these medicines before your procedure if your health care provider instructs you not to.  Plan to have someone take you home after the procedure. PROCEDURE  You may be given a medicine to help you relax (sedative).  You will be given a medicine to make you sleep (general anesthetic).  Your abdomen will be inflated with a gas. This will make your organs easier to see.  Small incisions will be made in your abdomen.  A laparoscope and other small instruments will be inserted into the abdomen through the incisions.  A tissue sample may be removed from an organ in the abdomen  for examination.  The instruments will be removed from the abdomen.  The gas will be released.  The incisions will be closed with stitches (sutures). AFTER THE PROCEDURE  Your blood pressure, heart rate, breathing rate, and blood oxygen level will be monitored often until the medicines you were given have worn off.   This information is not intended to replace advice given to you by your health care provider. Make sure you discuss any questions you have with your health care provider.               Clear Liquid Diet for GYN Oncology Patients Day Before Surgery The day before your scheduled surgery DO NOT EAT any solid foods.  We do want you to drink enough liquids, but NO MILK products.  We do not want you to be dehydrated.  Clear liquids are defined as no milk products and no pieces of any solid food. The following are all approved for you to drink the day before you surgery.  Chicken, Beef or Vegetable Broth (bouillon or consomm) - NO BROTH AFTER MIDNIGHT  Plain Jello  (no fruit)  Water  Strained lemonade or fruit punch  Gatorade (any flavor)  CLEAR Ensure or Boost Breeze  Fruit juices without pulp, such as apple, grape, or cranberry juice  Clear sodas - NO SODA AFTER MIDNIGHT  Ice Pops without bits of fruit or fruit pulp  Honey  Tea or coffee without milk or cream Any foods not on the above list should be avoided. **Note De-Identified [place] Obfuscation** DIVISION OF GYNECOLOGIC ONCOLOGY BOWEL PREP   The following instructions are extremely important to prepare for your surgery. Please follow them carefully   Step 1: Liquid Diet Instructions   The day before surgery, drink ONLY CLEAR LIQUIDS for breakfast, lunch, dinner and throughout the day.  Drink at least 64 oz of fluid.             CLEAR LIQUID EXAMPLES:             Beef, chicken or vegetable broth, sodas, coffee, tea (sugar, lemon             artificial  sweeteners, honey are acceptable), juices (apple, grape, cranberry, any    mixture of clear juices). Kool-Aid, Gatorade, Jell-o (without fruit), popsicles                          NO MILK, MILK PRODUCTS, NON-DAIRY CREAMERS    Step 2: Laxatives           The evening before surgery:   Time: around 5pm   Follow these instructions carefully.   Administer 1 Dulcolax suppository according to manufacturer instructions on the box. You will need to purchase this laxative at a pharmacy or grocery store.     Individual responses to laxatives vary; this prep may cause multiple bowel movements. It often works in 30 minutes and may take as long as 3 hours. Stay near an available bathroom.    It is important to stay hydrated. Ensure you are still drinking clear liquids.       IMPORTANT: FOR YOUR SAFETY, WE WILL HAVE TO CANCEL YOUR SURGERY IF YOU DO NOT FOLLOW THESE INSTRUCTIONS.    Do not eat anything after midnight (including gum or candy) prior to your surgery.  Avoid drinking carbonated beverages after midnight.  You can have clear liquids up until one hour before you arrive at the hospital. "Nothing by mouth" means no liquids, gum, candy, etc for one hour before your arrival time.                                              Bowel Symptoms After Surgery After gynecologic surgery, women often have temporary changes in bowel function (constipation and gas pain).  Following are tips to help prevent and treat common bowel problems.  It also tells you when to call the doctor.  This is important because some symptoms might be a sign of a more serious bowel problem such as obstruction (bowel blockage).  These problems are rare but can happen after gynecologic surgery.   Besides surgery, what can temporarily affect bowel function? 1. Dietary changes   2. Decreased physical activity   3.Antibiotics   4. Pain medication   How can I prevent constipation (three days or more without a  stool)? 1. Include fiber in your diet: whole grains, raw or dried fruits & vegetables, prunes, prune/pear juiceDrink at least 8 glasses of liquid (preferably water) every day 2. Avoid: ? Gas forming foods such as broccoli, beans, peas, salads, cabbage, sweet potatoes ? Greasy, fatty, or fried foods 3. Activity helps bowel function return to normal, walk around the house at least 3-4 times each day for 15 minutes or longer, if tolerated.  Rocking in a rocking chair is preferable to sitting still. 4. Stool softeners: these are not laxatives, **Note De-Identified Fenstermacher Obfuscation** but serve to soften the stool to avoid straining.  Take 2-4 times a day until normal bowel function returns         Examples: Colace or generic equivalent (Docusate) 5. Bulk laxatives: provide a concentrated source of fiber.  They do not stimulate the bowel.  Take 1-2 times each day until normal bowel function return.              Examples: Citrucel, Metamucil, Fiberal, Fibercon   What can I take for "Gas Pains"? 1. Simethicone (Mylicon, Gas-X, Maalox-Gas, Mylanta-Gas) take 3-4 times a day 2. Maalox Regular - take 3-4 times a day 3. Mylanta Regular - take 3-4 times a day   What can I take if I become constipated? 1. Start with stool softeners and add additional laxatives below as needed to have a bowel movement every 1-2 days  2. Stool softeners 1-2 tablets, 2 times a day 3. Senakot 1-2 tablets, 1-2 times a day 4. Glycerin suppository can soften hard stool take once a day 5. Bisacodyl suppository once a day  6. Milk of Magnesia 30 mL 1-2 times a day 7. Fleets or tap water enema    What can I do for nausea?  1. Limit most solid foods for 24-48 hours 2. Continue eating small frequent amounts of liquids and/or bland soft foods ? Toast, crackers, cooked cereal (grits, cream of wheat, rice) 3. Benadryl: a mild anti-nausea medicine can be obtained without a prescription. May cause drowsiness, especially if taken with narcotic pain medicines 4. Contact  provider for prescription nausea medication     What can I do, or take for diarrhea (more than five loose stools per day)? 1. Drink plenty of clear fluids to prevent dehydration 2. May take Kaopectate, Pepto-Bismol, Imodium, or probiotics for 1-2 days 3. Anusol or Preparation-H can be helpful for hemorrhoids and irritated tissue around anus   When should I call the doctor?             CONSTIPATION:   Not relieved after three days following the above program VOMITING:  That contains blood, "coffee ground" material  More the three times/hour and unable to keep down nausea medication for more than eight hours  With dry mouth, dark or strong urine, feeling light-headed, dizzy, or confused  With severe abdominal pain or bloating for more than 24 hours DIARRHEA:  That continues for more then 24-48 hours despite treatment  That contains blood or tarry material  With dry mouth, dark or strong urine, feeling light~headed, dizzy, or confused FEVER:  101 F or higher along with nausea, vomiting, gas pain, diarrhea UNABLE TO:  Pass gas from rectum for more than 24 hours  Tolerate liquids by mouth for more than 24 hours                   Laparoscopy, Care After Refer to this sheet in the next few weeks. These instructions provide you with information about caring for yourself after your procedure. Your health care provider may also give you more specific instructions. Your treatment has been planned according to current medical practices, but problems sometimes occur. Call your health care provider if you have any problems or questions after your procedure. WHAT TO EXPECT AFTER THE PROCEDURE After your procedure, it is common to have mild discomfort in the throat and abdomen. HOME CARE INSTRUCTIONS  Take over-the-counter and prescription medicines only as told by your health care provider.  Do not drive for 24 hours if you **Note De-Identified Minchew Obfuscation** received a sedative.  Return to your normal activities  as told by your health care provider.  Do not take baths, swim, or use a hot tub until your health care provider approves. You may shower.  Follow instructions from your health care provider about how to take care of your incision. Make sure you: ? Wash your hands with soap and water before you change your bandage (dressing). If soap and water are not available, use hand sanitizer. ? Change your dressing as told by your health care provider. ? Leave stitches (sutures), skin glue, or adhesive strips in place. These skin closures may need to stay in place for 2 weeks or longer. If adhesive strip edges start to loosen and curl up, you may trim the loose edges. Do not remove adhesive strips completely unless your health care provider tells you to do that.  Check your incision area every day for signs of infection. Check for: ? More redness, swelling, or pain. ? More fluid or blood. ? Warmth. ? Pus or a bad smell.  It is your responsibility to get the results of your procedure. Ask your health care provider or the department performing the procedure when your results will be ready. SEEK MEDICAL CARE IF:  There is new pain in your shoulders.  You feel light-headed or faint.  You are unable to pass gas or unable to have a bowel movement.  You feel nauseous or you vomit.  You develop a rash.  You have more redness, swelling, or pain around your incision.  You have more fluid or blood coming from your incision.  Your incision feels warm to the touch.  You have pus or a bad smell coming from your incision.  You have a fever or chills. SEEK IMMEDIATE MEDICAL CARE IF:  Your pain is getting worse.  You have ongoing vomiting.  The edges of your incision open up.  You have trouble breathing.  You have chest pain.   This information is not intended to replace advice given to you by your health care provider. Make sure you discuss any questions you have with your health care  provider.    Laparoscopic Hysterectomy, Care After Refer to this sheet in the next few weeks. These instructions provide you with information on caring for yourself after your procedure. Your health care provider may also give you more specific instructions. Your treatment has been planned according to current medical practices, but problems sometimes occur. Call your health care provider if you have any problems or questions after your procedure. What can I expect after the procedure?  Pain and bruising at the incision sites. You will be given pain medicine to control it.  Menopausal symptoms such as hot flashes, night sweats, and insomnia if your ovaries were removed.  Sore throat from the breathing tube that was inserted during surgery. Follow these instructions at home:  Only take over-the-counter or prescription medicines for pain, discomfort, or fever as directed by your health care provider.  Do not take aspirin. It can cause bleeding.  Do not drive when taking pain medicine.  Follow your health care provider's advice regarding diet, exercise, lifting, driving, and general activities.  Resume your usual diet as directed and allowed.  Get plenty of rest and sleep.  Do not douche, use tampons, or have sexual intercourse for at least 6 weeks, or until your health care provider gives you permission.  Change your bandages (dressings) as directed by your health care provider.  Monitor your **Note De-Identified Templer Obfuscation** temperature and notify your health care provider of a fever.  Take showers instead of baths for 2-3 weeks.  Do not drink alcohol until your health care provider gives you permission.  If you develop constipation, you may take a mild laxative with your health care provider's permission. Bran foods may help with constipation problems. Drinking enough fluids to keep your urine clear or pale yellow may help as well.  Try to have someone home with you for 1-2 weeks to help around the  house.  Keep all of your follow-up appointments as directed by your health care provider. Contact a health care provider if:  You have swelling, redness, or increasing pain around your incision sites.  You have pus coming from your incision.  You notice a bad smell coming from your incision.  Your incision breaks open.  You feel dizzy or lightheaded.  You have pain or bleeding when you urinate.  You have persistent diarrhea.  You have persistent nausea and vomiting.  You have abnormal vaginal discharge.  You have a rash.  You have any type of abnormal reaction or develop an allergy to your medicine.  You have poor pain control with your prescribed medicine. Get help right away if:  You have chest pain or shortness of breath.  You have severe abdominal pain that is not relieved with pain medicine.  You have pain or swelling in your legs. This information is not intended to replace advice given to you by your health care provider. Make sure you discuss any questions you have with your health care provider. Document Released: 05/21/2013 Document Revised: 01/06/2016 Document Reviewed: 02/18/2013 Elsevier Interactive Patient Education  2017 [employer].

## 2018-01-30 NOTE — Progress Notes (Signed)
**Note De-Identified Gregori Obfuscation** Pt here to see if she will have surgery for her cancer dx

## 2018-01-30 NOTE — H&P (View-Only) (Signed)
**Note De-Identified Fredricksen Obfuscation** Gynecologic Oncology Consult Visit   Referring Provider:  Dr. Amalia Hailey  Chief Concern: Grade 1 endometrial cancer  Subjective:  Suzanne Jennings is a 67 y.o. P2 female who is seen in consultation from Dr. Julian Hy for grade 1 endometrial cancer.  Had three weeks of vaginal bleeding.  Endometrial biopsy 01/17/18 by Dr Amalia Hailey showed grade 1 endometrial cancer with focal polyp.   Problem List: Patient Active Problem List   Diagnosis Date Noted  . Post-menopausal bleeding 12/10/2017  . Type 2 diabetes mellitus without complications ([place]) 00/86/7619  . Advanced care planning/counseling discussion 03/12/2017  . Benign neoplasm of descending colon   . Polyp of sigmoid colon   . Benign neoplasm of cecum   . Metabolic syndrome 50/93/2671  . 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 Hypercholesteremia 09/21/2015  . Hypertension 08/30/2015  . Osteoarthritis (arthritis due to wear and tear of joints) 08/30/2015  . Obesity 08/30/2015    Past Medical History: Past Medical History:  Diagnosis Date  . Diabetes mellitus without complication ([place])   . Endometrial cancer ([place])   . Hyperlipidemia   . Kidney stone   . Knee pain     Past Surgical History: Past Surgical History:  Procedure Laterality Date  . APPENDECTOMY    . COLONOSCOPY WITH PROPOFOL N/A 07/03/2016   Procedure: COLONOSCOPY WITH PROPOFOL;  Surgeon: Lucilla Lame, MD;  Location: [facility];  Service: Endoscopy;  Laterality: N/A;  . POLYPECTOMY  07/03/2016   Procedure: POLYPECTOMY;  Surgeon: Lucilla Lame, MD;  Location: [facility];  Service: Endoscopy;;  . TUBAL LIGATION      OB History:  OB History  Gravida Para Term Preterm AB Living  2 2 1 1   2   SAB TAB Ectopic Multiple Live Births          2    # Outcome Date GA Lbr Len/2nd Weight Sex Delivery Anes PTL Lv  2 Term 1979    M Vag-Spont   LIV  1 Preterm 1974    F Vag-Spont   LIV    Family History: Family History  Problem Relation Age of Onset  . Emphysema Mother    . Heart disease Father        massive MI  . Hypertension Sister   . Mental illness Brother   . Hypertension Sister   . Hypertension Sister   . Diabetes Maternal Grandmother   . Breast cancer Neg Hx     Social History: Social History   Socioeconomic History  . Marital status: Married    Spouse name: Not on file  . Number of children: Not on file  . Years of education: Not on file  . Highest education level: Not on file  Occupational History  . Not on file  Social Needs  . Financial resource strain: Not on file  . Food insecurity:    Worry: Not on file    Inability: Not on file  . Transportation needs:    Medical: Not on file    Non-medical: Not on file  Tobacco Use  . Smoking status: Never Smoker  . Smokeless tobacco: Never Used  Substance and Sexual Activity  . Alcohol use: No    Alcohol/week: 0.0 oz  . Drug use: No  . Sexual activity: Yes    Birth control/protection: Post-menopausal  Lifestyle  . Physical activity:    Days per week: Not on file    Minutes per session: Not on file  . Stress: Not on file  Relationships  . **Note De-Identified Devin Obfuscation** Social connections:    Talks on phone: Not on file    Gets together: Not on file    Attends religious service: Not on file    Active member of club or organization: Not on file    Attends meetings of clubs or organizations: Not on file    Relationship status: Not on file  . Intimate partner violence:    Fear of current or ex partner: Not on file    Emotionally abused: Not on file    Physically abused: Not on file    Forced sexual activity: Not on file  Other Topics Concern  . Not on file  Social History Narrative  . Not on file    Allergies: Allergies  Allergen Reactions  . Peanut-Containing Drug Products Anaphylaxis    Can eat almonds and peanuts  . Aspirin Other (See Comments)    "blood thinner, makes nose bleed - but pt can take 81 mg dose without problems  . Other Other (See Comments)    Yogurt causes yeast infections  .  Penicillins Rash  . Sulfa Antibiotics Rash    Current Medications: Current Outpatient Medications  Medication Sig Dispense Refill  . aspirin EC 81 MG tablet Take 1 tablet (81 mg total) by mouth daily. 90 tablet 3  . atorvastatin (LIPITOR) 10 MG tablet TAKE 1 TABLET BY MOUTH  DAILY 90 tablet 1  . ONETOUCH VERIO test strip One Touch Verio- pt states she checks her sugar everyday 100 each 12  . Probiotic Product (PROBIOTIC PO) Take 725 mg by mouth daily.    Marland Kitchen spironolactone (ALDACTONE) 25 MG tablet TAKE 1 TABLET BY MOUTH  DAILY 90 tablet 1   No current facility-administered medications for this visit.     Review of Systems General: negative for, fevers, chills, fatigue, changes in sleep, changes in weight or appetite Skin: negative for changes in color, texture, moles or lesions Eyes: negative for, changes in vision, pain, diplopia HEENT: negative for, change in hearing, pain, discharge, tinnitus, vertigo, voice changes, sore throat, neck masses Breasts: negative for breast lumps Pulmonary: negative for, dyspnea, orthopnea, productive cough Cardiac: negative for, palpitations, syncope, pain, discomfort, pressure Gastrointestinal: negative for, dysphagia, nausea, vomiting, jaundice, pain, constipation, diarrhea, hematemesis, hematochezia Genitourinary/Sexual: negative for, dysuria, discharge, hesitancy, nocturia, retention, stones, infections, STD's, incontinence Ob/Gyn: negative for, irregular bleeding, pain Musculoskeletal: negative for, pain, stiffness, swelling, range of motion limitation Hematology: negative for, easy bruising, bleeding Neurologic/Psych: negative for, headaches, seizures, paralysis, weakness, tremor, change in gait, change in sensation, mood swings, depression, anxiety, change in memory  Objective:  Physical Examination:  BP (!) 143/77   Pulse 75   Temp 98.3 F (36.8 C) (Oral)   Resp 18   Ht 5\' 1"  (1.549 m)   Wt 189 lb 3.2 oz (85.8 kg)   LMP  (LMP Unknown)    BMI 35.75 kg/m    ECOG Performance Status: 0 - Asymptomatic  General appearance: alert, cooperative and appears stated age HEENT:PERRLA, neck supple with midline trachea and thyroid without masses Lymph node survey: non-palpable, axillary, inguinal, supraclavicular Cardiovascular: regular rate and rhythm, no murmurs or gallops Respiratory: normal air entry, lungs clear to auscultation and no rales, rhonchi or wheezing Breast exam: not examined. Abdomen: soft, non-tender, without masses or organomegaly, no hernias and well healed incision Back: inspection of back is normal Extremities: extremities normal, atraumatic, no cyanosis or edema Skin exam - normal coloration and turgor, no rashes, no suspicious skin lesions noted. Neurological exam reveals alert, oriented, **Note De-Identified [place] Obfuscation** normal speech, no focal findings or movement disorder noted.  Pelvic: deferred per patient request     Assessment:  Suzanne Jennings is a 66 y.o. female diagnosed with grade 1 endometrial cancer.    Medical co-morbidities complicating care: diet controlled AODM.  Plan:   Problem List Items Addressed This Visit    None    Visit Diagnoses    Endometrial cancer, grade I ([place])    -  Primary     We discussed options for management including TLH/BSO, SLN mapping and biopsies and possible pelvic/aortic node dissection.  The risks of surgery were discussed in detail and she understands these to include infection; wound separation; hernia; vaginal cuff separation, injury to adjacent organs such as bowel, bladder, blood vessels, ureters and nerves; bleeding which may require blood transfusion; anesthesia risk; thromboembolic events; possible death; unforeseen complications; possible need for re-exploration; medical complications such as heart attack, stroke, pleural effusion and pneumonia; and, if staging performed the risk of lymphedema and lymphocyst.  The patient will receive DVT and antibiotic prophylaxis as indicated.  She  voiced a clear understanding.  She had the opportunity to ask questions and written informed consent was obtained today.  The patient's diagnosis, an outline of the further diagnostic and laboratory studies which will be required, the recommendation, and alternatives were discussed.  All questions were answered to the patient's satisfaction.  A total of 60 minutes were spent with the patient/family today; 40% was spent in education, counseling and coordination of care for endometrial cancer.    Mellody Drown, MD   CC:  Kathrine Haddock, NP [address], [place] [address] [phone]

## 2018-01-30 NOTE — Progress Notes (Addendum)
**Note De-Identified Stegmaier Obfuscation** Gynecologic Oncology Consult Visit   Referring Provider:  Dr. Amalia Hailey  Chief Concern: Grade 1 endometrial cancer  Subjective:  Suzanne Jennings is a 67 y.o. P2 female who is seen in consultation from Dr. Julian Hy for grade 1 endometrial cancer.  Had three weeks of vaginal bleeding.  Endometrial biopsy 01/17/18 by Dr Amalia Hailey showed grade 1 endometrial cancer with focal polyp.   Problem List: Patient Active Problem List   Diagnosis Date Noted  . Post-menopausal bleeding 12/10/2017  . Type 2 diabetes mellitus without complications ([place]) 60/63/0160  . Advanced care planning/counseling discussion 03/12/2017  . Benign neoplasm of descending colon   . Polyp of sigmoid colon   . Benign neoplasm of cecum   . Metabolic syndrome 10/93/2355  . 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 Hypercholesteremia 09/21/2015  . Hypertension 08/30/2015  . Osteoarthritis (arthritis due to wear and tear of joints) 08/30/2015  . Obesity 08/30/2015    Past Medical History: Past Medical History:  Diagnosis Date  . Diabetes mellitus without complication ([place])   . Endometrial cancer ([place])   . Hyperlipidemia   . Kidney stone   . Knee pain     Past Surgical History: Past Surgical History:  Procedure Laterality Date  . APPENDECTOMY    . COLONOSCOPY WITH PROPOFOL N/A 07/03/2016   Procedure: COLONOSCOPY WITH PROPOFOL;  Surgeon: Lucilla Lame, MD;  Location: [facility];  Service: Endoscopy;  Laterality: N/A;  . POLYPECTOMY  07/03/2016   Procedure: POLYPECTOMY;  Surgeon: Lucilla Lame, MD;  Location: [facility];  Service: Endoscopy;;  . TUBAL LIGATION      OB History:  OB History  Gravida Para Term Preterm AB Living  2 2 1 1   2   SAB TAB Ectopic Multiple Live Births          2    # Outcome Date GA Lbr Len/2nd Weight Sex Delivery Anes PTL Lv  2 Term 1979    M Vag-Spont   LIV  1 Preterm 1974    F Vag-Spont   LIV    Family History: Family History  Problem Relation Age of Onset  . Emphysema Mother    . Heart disease Father        massive MI  . Hypertension Sister   . Mental illness Brother   . Hypertension Sister   . Hypertension Sister   . Diabetes Maternal Grandmother   . Breast cancer Neg Hx     Social History: Social History   Socioeconomic History  . Marital status: Married    Spouse name: Not on file  . Number of children: Not on file  . Years of education: Not on file  . Highest education level: Not on file  Occupational History  . Not on file  Social Needs  . Financial resource strain: Not on file  . Food insecurity:    Worry: Not on file    Inability: Not on file  . Transportation needs:    Medical: Not on file    Non-medical: Not on file  Tobacco Use  . Smoking status: Never Smoker  . Smokeless tobacco: Never Used  Substance and Sexual Activity  . Alcohol use: No    Alcohol/week: 0.0 oz  . Drug use: No  . Sexual activity: Yes    Birth control/protection: Post-menopausal  Lifestyle  . Physical activity:    Days per week: Not on file    Minutes per session: Not on file  . Stress: Not on file  Relationships  . **Note De-Identified Fogel Obfuscation** Social connections:    Talks on phone: Not on file    Gets together: Not on file    Attends religious service: Not on file    Active member of club or organization: Not on file    Attends meetings of clubs or organizations: Not on file    Relationship status: Not on file  . Intimate partner violence:    Fear of current or ex partner: Not on file    Emotionally abused: Not on file    Physically abused: Not on file    Forced sexual activity: Not on file  Other Topics Concern  . Not on file  Social History Narrative  . Not on file    Allergies: Allergies  Allergen Reactions  . Peanut-Containing Drug Products Anaphylaxis    Can eat almonds and peanuts  . Aspirin Other (See Comments)    "blood thinner, makes nose bleed - but pt can take 81 mg dose without problems  . Other Other (See Comments)    Yogurt causes yeast infections  .  Penicillins Rash  . Sulfa Antibiotics Rash    Current Medications: Current Outpatient Medications  Medication Sig Dispense Refill  . aspirin EC 81 MG tablet Take 1 tablet (81 mg total) by mouth daily. 90 tablet 3  . atorvastatin (LIPITOR) 10 MG tablet TAKE 1 TABLET BY MOUTH  DAILY 90 tablet 1  . ONETOUCH VERIO test strip One Touch Verio- pt states she checks her sugar everyday 100 each 12  . Probiotic Product (PROBIOTIC PO) Take 725 mg by mouth daily.    Marland Kitchen spironolactone (ALDACTONE) 25 MG tablet TAKE 1 TABLET BY MOUTH  DAILY 90 tablet 1   No current facility-administered medications for this visit.     Review of Systems General: negative for, fevers, chills, fatigue, changes in sleep, changes in weight or appetite Skin: negative for changes in color, texture, moles or lesions Eyes: negative for, changes in vision, pain, diplopia HEENT: negative for, change in hearing, pain, discharge, tinnitus, vertigo, voice changes, sore throat, neck masses Breasts: negative for breast lumps Pulmonary: negative for, dyspnea, orthopnea, productive cough Cardiac: negative for, palpitations, syncope, pain, discomfort, pressure Gastrointestinal: negative for, dysphagia, nausea, vomiting, jaundice, pain, constipation, diarrhea, hematemesis, hematochezia Genitourinary/Sexual: negative for, dysuria, discharge, hesitancy, nocturia, retention, stones, infections, STD's, incontinence Ob/Gyn: negative for, irregular bleeding, pain Musculoskeletal: negative for, pain, stiffness, swelling, range of motion limitation Hematology: negative for, easy bruising, bleeding Neurologic/Psych: negative for, headaches, seizures, paralysis, weakness, tremor, change in gait, change in sensation, mood swings, depression, anxiety, change in memory  Objective:  Physical Examination:  BP (!) 143/77   Pulse 75   Temp 98.3 F (36.8 C) (Oral)   Resp 18   Ht 5\' 1"  (1.549 m)   Wt 189 lb 3.2 oz (85.8 kg)   LMP  (LMP Unknown)    BMI 35.75 kg/m    ECOG Performance Status: 0 - Asymptomatic  General appearance: alert, cooperative and appears stated age HEENT:PERRLA, neck supple with midline trachea and thyroid without masses Lymph node survey: non-palpable, axillary, inguinal, supraclavicular Cardiovascular: regular rate and rhythm, no murmurs or gallops Respiratory: normal air entry, lungs clear to auscultation and no rales, rhonchi or wheezing Breast exam: not examined. Abdomen: soft, non-tender, without masses or organomegaly, no hernias and well healed incision Back: inspection of back is normal Extremities: extremities normal, atraumatic, no cyanosis or edema Skin exam - normal coloration and turgor, no rashes, no suspicious skin lesions noted. Neurological exam reveals alert, oriented, **Note De-Identified Fehr Obfuscation** normal speech, no focal findings or movement disorder noted.  Pelvic: deferred per patient request     Assessment:  Evola Hollis Vittitow is a 67 y.o. female diagnosed with grade 1 endometrial cancer.    Medical co-morbidities complicating care: diet controlled AODM.  Plan:   Problem List Items Addressed This Visit    None    Visit Diagnoses    Endometrial cancer, grade I ([place])    -  Primary     We discussed options for management including TLH/BSO, SLN mapping and biopsies and possible pelvic/aortic node dissection.  The risks of surgery were discussed in detail and she understands these to include infection; wound separation; hernia; vaginal cuff separation, injury to adjacent organs such as bowel, bladder, blood vessels, ureters and nerves; bleeding which may require blood transfusion; anesthesia risk; thromboembolic events; possible death; unforeseen complications; possible need for re-exploration; medical complications such as heart attack, stroke, pleural effusion and pneumonia; and, if staging performed the risk of lymphedema and lymphocyst.  The patient will receive DVT and antibiotic prophylaxis as indicated.  She  voiced a clear understanding.  She had the opportunity to ask questions and written informed consent was obtained today.  The patient's diagnosis, an outline of the further diagnostic and laboratory studies which will be required, the recommendation, and alternatives were discussed.  All questions were answered to the patient's satisfaction.  A total of 60 minutes were spent with the patient/family today; 40% was spent in education, counseling and coordination of care for endometrial cancer.    Mellody Drown, MD   CC:  Kathrine Haddock, NP [address], [place] [address] [phone]

## 2018-01-31 ENCOUNTER — Telehealth: Payer: Self-pay

## 2018-01-31 NOTE — Progress Notes (Signed)
**Note De-Identified Gose Obfuscation** Surgery has been scheduled for 02/20/2018. Surgery booking request faxed to OR scheduling with confirmation of receipt. Copy of consent faxed to PAT with confirmation of receipt. Original consent sent to medical records for filing. Oncology Nurse Navigator Documentation  Navigator Location: [facility] (01/31/18 1000)   )Navigator Encounter Type: Other (01/31/18 1000)                                                    Time Spent with Patient: 30 (01/31/18 1000)

## 2018-01-31 NOTE — Telephone Encounter (Signed)
**Note De-Identified Radich Obfuscation** Voicemail left with PAT appointment details. Phone interview 7/3 between 9-1p. Instructed to call with any questions. Oncology Nurse Navigator Documentation  Navigator Location: [facility] (01/31/18 1600)   )Navigator Encounter Type: Telephone (01/31/18 1600) Telephone: Suzanne Jennings Call;Appt Confirmation/Clarification (01/31/18 1600)                                                  Time Spent with Patient: 15 (01/31/18 1600)

## 2018-02-13 ENCOUNTER — Encounter
Admission: RE | Admit: 2018-02-13 | Discharge: 2018-02-13 | Disposition: A | Discharge status: Home / Self Care | Payer: Medicare Other | Source: Ambulatory Visit | Attending: Obstetrics and Gynecology | Admitting: Obstetrics and Gynecology

## 2018-02-13 ENCOUNTER — Other Ambulatory Visit: Payer: Self-pay

## 2018-02-13 HISTORY — DX: Essential (primary) hypertension: I10

## 2018-02-13 HISTORY — DX: Personal history of urinary calculi: Z87.442

## 2018-02-13 NOTE — Patient Instructions (Signed)
**Note De-Identified Lankford Obfuscation** Your procedure is scheduled on: 02/20/18 Report to Day Surgery. [facility] SECOND FLOOR To find out your arrival time please call [phone] between 1PM - 3PM on 02/19/18  Remember: Instructions that are not followed completely may result in serious medical risk,  up to and including death, or upon the discretion of your surgeon and anesthesiologist your  surgery may need to be rescheduled.     _X__ 1. Do not eat food after midnight the night before your procedure.                 No gum chewing or hard candies. You may drink clear liquids up to 2 hours                 before you are scheduled to arrive for your surgery- DO not drink clear                 liquids within 2 hours of the start of your surgery.                 Clear Liquids include:  water, apple juice without pulp, clear carbohydrate                 drink such as Clearfast of Gatorade, Black Coffee or Tea (Do not add                 anything to coffee or tea).  __X__2.  On the morning of surgery brush your teeth with toothpaste and water, you                may rinse your mouth with mouthwash if you wish.  Do not swallow any toothpaste of mouthwash.     _X__ 3.  No Alcohol for 24 hours before or after surgery.   _X__ 4.  Do Not Smoke or use e-cigarettes For 24 Hours Prior to Your Surgery.                 Do not use any chewable tobacco products for at least 6 hours prior to                 surgery.  ____  5.  Bring all medications with you on the day of surgery if instructed.   X____  6.  Notify your doctor if there is any change in your medical condition      (cold, fever, infections).     Do not wear jewelry, make-up, hairpins, clips or nail polish. Do not wear lotions, powders, or perfumes. You may wear deodorant. Do not shave 48 hours prior to surgery. Men may shave face and neck. Do not bring valuables to the hospital.    [facility] is not responsible for any belongings or  valuables.  Contacts, dentures or bridgework may not be worn into surgery. Leave your suitcase in the car. After surgery it may be brought to your room. For patients admitted to the hospital, discharge time is determined by your treatment team.   Patients discharged the day of surgery will not be allowed to drive home.   Please read over the following fact sheets that you were given:   Surgical Site Infection Prevention   _X___ Take these medicines the morning of surgery with A SIP OF WATER:    1. ATORVASTATIN  2.   3.   4.  5.  6.  ____ Fleet Enema (as directed)   X____ Use CHG Soap as directed **Note De-Identified Klutts Obfuscation** ____ Use inhalers on the day of surgery  ____ Stop metformin 2 days prior to surgery    ____ Take 1/2 of usual insulin dose the night before surgery. No insulin the morning          of surgery.   _X___ Stop Coumadin/Plavix/aspirin on  STOP ASPIRIN AS OF 02/13/18 ____ Stop Anti-inflammatories on    ____ Stop supplements until after surgery.    ____ Bring C-Pap to the hospital.

## 2018-02-15 ENCOUNTER — Ambulatory Visit
Admission: RE | Admit: 2018-02-15 | Discharge: 2018-02-15 | Disposition: A | Discharge status: Home / Self Care | Payer: Medicare Other | Source: Ambulatory Visit | Attending: Obstetrics and Gynecology | Admitting: Obstetrics and Gynecology

## 2018-02-15 ENCOUNTER — Encounter
Admission: RE | Admit: 2018-02-15 | Discharge: 2018-02-15 | Disposition: A | Discharge status: Home / Self Care | Payer: Medicare Other | Source: Ambulatory Visit | Attending: Obstetrics and Gynecology | Admitting: Obstetrics and Gynecology

## 2018-02-15 DIAGNOSIS — R9431 Abnormal electrocardiogram [ECG] [EKG]: Secondary | ICD-10-CM | POA: Diagnosis not present

## 2018-02-15 DIAGNOSIS — R918 Other nonspecific abnormal finding of lung field: Secondary | ICD-10-CM | POA: Diagnosis not present

## 2018-02-15 DIAGNOSIS — Z7982 Long term (current) use of aspirin: Secondary | ICD-10-CM | POA: Insufficient documentation

## 2018-02-15 DIAGNOSIS — Z01811 Encounter for preprocedural respiratory examination: Secondary | ICD-10-CM | POA: Insufficient documentation

## 2018-02-15 DIAGNOSIS — E119 Type 2 diabetes mellitus without complications: Secondary | ICD-10-CM | POA: Insufficient documentation

## 2018-02-15 DIAGNOSIS — C541 Malignant neoplasm of endometrium: Secondary | ICD-10-CM | POA: Diagnosis not present

## 2018-02-15 DIAGNOSIS — Z79899 Other long term (current) drug therapy: Secondary | ICD-10-CM | POA: Diagnosis not present

## 2018-02-15 DIAGNOSIS — E785 Hyperlipidemia, unspecified: Secondary | ICD-10-CM | POA: Insufficient documentation

## 2018-02-15 DIAGNOSIS — Z01818 Encounter for other preprocedural examination: Secondary | ICD-10-CM | POA: Insufficient documentation

## 2018-02-15 DIAGNOSIS — I10 Essential (primary) hypertension: Secondary | ICD-10-CM | POA: Insufficient documentation

## 2018-02-15 LAB — URINALYSIS, ROUTINE W REFLEX MICROSCOPIC
BACTERIA UA: NONE SEEN
Bilirubin Urine: NEGATIVE
Glucose, UA: NEGATIVE mg/dL
KETONES UR: NEGATIVE mg/dL
NITRITE: NEGATIVE
PROTEIN: NEGATIVE mg/dL
Specific Gravity, Urine: 1.018 (ref 1.005–1.030)
pH: 5 (ref 5.0–8.0)

## 2018-02-15 LAB — PROTIME-INR
INR: 0.95
PROTHROMBIN TIME: 12.6 s (ref 11.4–15.2)

## 2018-02-15 LAB — CBC WITH DIFFERENTIAL/PLATELET
Basophils Absolute: 0.1 10*3/uL (ref 0–0.1)
Basophils Relative: 1 %
EOS ABS: 0.1 10*3/uL (ref 0–0.7)
EOS PCT: 2 %
HCT: 42.1 % (ref 35.0–47.0)
HEMOGLOBIN: 14.9 g/dL (ref 12.0–16.0)
LYMPHS ABS: 2.5 10*3/uL (ref 1.0–3.6)
Lymphocytes Relative: 32 %
MCH: 34.4 pg — AB (ref 26.0–34.0)
MCHC: 35.3 g/dL (ref 32.0–36.0)
MCV: 97.5 fL (ref 80.0–100.0)
MONOS PCT: 8 %
Monocytes Absolute: 0.6 10*3/uL (ref 0.2–0.9)
NEUTROS PCT: 57 %
Neutro Abs: 4.5 10*3/uL (ref 1.4–6.5)
PLATELETS: 286 10*3/uL (ref 150–440)
RBC: 4.32 MIL/uL (ref 3.80–5.20)
RDW: 12.3 % (ref 11.5–14.5)
WBC: 7.9 10*3/uL (ref 3.6–11.0)

## 2018-02-15 LAB — BASIC METABOLIC PANEL
Anion gap: 10 (ref 5–15)
BUN: 13 mg/dL (ref 8–23)
CHLORIDE: 101 mmol/L (ref 98–111)
CO2: 28 mmol/L (ref 22–32)
CREATININE: 0.87 mg/dL (ref 0.44–1.00)
Calcium: 9.4 mg/dL (ref 8.9–10.3)
GFR calc Af Amer: >60 mL/min (ref >60)
GFR calc non Af Amer: >60 mL/min (ref >60)
Glucose, Bld: 155 mg/dL — ABNORMAL HIGH (ref 70–99)
Potassium: 4.2 mmol/L (ref 3.5–5.1)
Sodium: 139 mmol/L (ref 135–145)

## 2018-02-15 LAB — APTT: aPTT: 28 seconds (ref 24–36)

## 2018-02-15 LAB — TYPE AND SCREEN
ABO/RH(D): B POS
Antibody Screen: NEGATIVE

## 2018-02-15 NOTE — Pre-Procedure Instructions (Signed)
**Note De-Identified Carpenter Obfuscation** CALLED DR Ronelle Nigh REGARDING ABNORMAL EKG-DR KEPHART REVIEWED TODAYS EKG WITH 2017 EKG AND SAID THAT PT NEEDS MEDICAL CLEARANCE.

## 2018-02-15 NOTE — Pre-Procedure Instructions (Signed)
**Note De-Identified Botello Obfuscation** FAXED CLEARANCE REQUEST TO DR CRISSMANS OFFICE AND TO DR Fransisca Connors WITH FAX CONFIRMATION RECEIVED FROM BOTH OFFICES.  I ALSO FAXED UA RESULTS TO DR BERCHUCKS OFFICE. WILL F/U WITH DR [facility] OFFICE ON Monday AM TO MAKE SURE THEY RECEIVED CLEARANCE

## 2018-02-18 ENCOUNTER — Telehealth: Payer: Self-pay | Admitting: Unknown Physician Specialty

## 2018-02-18 ENCOUNTER — Other Ambulatory Visit: Payer: Self-pay | Admitting: Nurse Practitioner

## 2018-02-18 DIAGNOSIS — R918 Other nonspecific abnormal finding of lung field: Secondary | ICD-10-CM

## 2018-02-18 NOTE — Telephone Encounter (Signed)
**Note De-Identified Grave Obfuscation** LVM for pt to call back to schedule a surgical clearance appt.

## 2018-02-18 NOTE — Pre-Procedure Instructions (Signed)
**Note De-Identified Labombard Obfuscation** Called over to Dr Durenda Age office to make sure they received the medical clearance that was faxed over late Friday evening.  Receptionist took all my information and is going to relay message to the [place] and she will call me back and let me know if clearance was received

## 2018-02-18 NOTE — Pre-Procedure Instructions (Signed)
**Note De-Identified Talerico Obfuscation** [place] and left message on nurse line that pt was needing medical clearance and that I faxed all the info to Dr Jeananne Rama and to the [facility].  I left my direct # if nurse had any questions

## 2018-02-18 NOTE — Telephone Encounter (Signed)
**Note De-Identified Beets Obfuscation** Copied from [facility] [phone]. Topic: Inquiry >> Feb 18, 2018  9:31 AM Margot Ables wrote: Reason for CRM: pt is scheduled for surgery 02/20/18 and a medical clearance for abnormal EKG was faxed Friday evening to [facility]. She is following up to make sure pt will be cleared for surgery. Please call to advise this was received.

## 2018-02-18 NOTE — Telephone Encounter (Signed)
**Note De-Identified Squillace Obfuscation** I can't seem to access the EKG on my computer.  Plus, I can't give surgical clearance without a visit

## 2018-02-19 ENCOUNTER — Ambulatory Visit: Payer: Medicare Other | Admitting: Family Medicine

## 2018-02-19 ENCOUNTER — Ambulatory Visit: Payer: Medicare Other | Admitting: Unknown Physician Specialty

## 2018-02-19 ENCOUNTER — Encounter: Payer: Self-pay | Admitting: Family Medicine

## 2018-02-19 VITALS — BP 129/80 | HR 68 | Temp 97.6°F | Ht 61.0 in | Wt 188.3 lb

## 2018-02-19 DIAGNOSIS — R9431 Abnormal electrocardiogram [ECG] [EKG]: Secondary | ICD-10-CM

## 2018-02-19 NOTE — Telephone Encounter (Signed)
**Note De-Identified Baggett Obfuscation** LVM for clearance.

## 2018-02-19 NOTE — Pre-Procedure Instructions (Signed)
**Note De-identified Si Obfuscation** MEDICAL CLEARANCE ON CHART LOW RISK 

## 2018-02-19 NOTE — Progress Notes (Signed)
**Note De-Identified Vecchiarelli Obfuscation** BP 129/80 (BP Location: Right Arm, Patient Position: Sitting, Cuff Size: Normal)   Pulse 68   Temp 97.6 F (36.4 C) (Oral)   Ht 5\' 1"  (1.549 m)   Wt 188 lb 4.8 oz (85.4 kg)   LMP  (LMP Unknown)   SpO2 99%   BMI 35.58 kg/m    Subjective:    Patient ID: Suzanne Jennings, female    DOB: 1950-12-12, 67 y.o.   MRN: [id number]  HPI: Brody Kump [place] is a 67 y.o. female  Chief Complaint  Patient presents with  . Surgical Clearance   Pt scheduled for laparascopic hysterectomy tomorrow, had abnormal EKG at pre-op appt 7/5. Had full lab panel at this appt without concerning findings. No active sxs, including CP, SOB, dizziness, syncope, fatigue, nausea.   Relevant past medical, surgical, family and social history reviewed and updated as indicated. Interim medical history since our last visit reviewed. Allergies and medications reviewed and updated.  Review of Systems  Per HPI unless specifically indicated above     Objective:    BP 129/80 (BP Location: Right Arm, Patient Position: Sitting, Cuff Size: Normal)   Pulse 68   Temp 97.6 F (36.4 C) (Oral)   Ht 5\' 1"  (1.549 m)   Wt 188 lb 4.8 oz (85.4 kg)   LMP  (LMP Unknown)   SpO2 99%   BMI 35.58 kg/m   Wt Readings from Last 3 Encounters:  02/19/18 188 lb 4.8 oz (85.4 kg)  01/30/18 189 lb 3.2 oz (85.8 kg)  01/24/18 187 lb 12.8 oz (85.2 kg)    Physical Exam  Constitutional: She is oriented to person, place, and time. She appears well-developed and well-nourished. No distress.  HENT:  Head: Atraumatic.  Eyes: Conjunctivae and EOM are normal.  Neck: Normal range of motion. Neck supple.  Cardiovascular: Normal rate, regular rhythm and normal heart sounds.  Pulmonary/Chest: Effort normal and breath sounds normal.  Musculoskeletal: Normal range of motion.  Neurological: She is alert and oriented to person, place, and time.  Skin: Skin is warm and dry.  Psychiatric: She has a normal mood and affect. Her behavior is normal.  Nursing  note and vitals reviewed.  Results for orders placed or performed during the hospital encounter of 02/15/18  APTT  Result Value Ref Range   aPTT 28 24 - 36 seconds  Basic metabolic panel  Result Value Ref Range   Sodium 139 135 - 145 mmol/L   Potassium 4.2 3.5 - 5.1 mmol/L   Chloride 101 98 - 111 mmol/L   CO2 28 22 - 32 mmol/L   Glucose, Bld 155 (H) 70 - 99 mg/dL   BUN 13 8 - 23 mg/dL   Creatinine, Ser 0.87 0.44 - 1.00 mg/dL   Calcium 9.4 8.9 - 10.3 mg/dL   GFR calc non Af Amer >60 >60 mL/min   GFR calc Af Amer >60 >60 mL/min   Anion gap 10 5 - 15  CBC WITH DIFFERENTIAL  Result Value Ref Range   WBC 7.9 3.6 - 11.0 K/uL   RBC 4.32 3.80 - 5.20 MIL/uL   Hemoglobin 14.9 12.0 - 16.0 g/dL   HCT 42.1 35.0 - 47.0 %   MCV 97.5 80.0 - 100.0 fL   MCH 34.4 (H) 26.0 - 34.0 pg   MCHC 35.3 32.0 - 36.0 g/dL   RDW 12.3 11.5 - 14.5 %   Platelets 286 150 - 440 K/uL   Neutrophils Relative % 57 %   Neutro Abs **Note De-Identified Ellen Obfuscation** 4.5 1.4 - 6.5 K/uL   Lymphocytes Relative 32 %   Lymphs Abs 2.5 1.0 - 3.6 K/uL   Monocytes Relative 8 %   Monocytes Absolute 0.6 0.2 - 0.9 K/uL   Eosinophils Relative 2 %   Eosinophils Absolute 0.1 0 - 0.7 K/uL   Basophils Relative 1 %   Basophils Absolute 0.1 0 - 0.1 K/uL  Protime-INR  Result Value Ref Range   Prothrombin Time 12.6 11.4 - 15.2 seconds   INR 0.95   Urinalysis, Routine w reflex microscopic  Result Value Ref Range   Color, Urine YELLOW (A) YELLOW   APPearance HAZY (A) CLEAR   Specific Gravity, Urine 1.018 1.005 - 1.030   pH 5.0 5.0 - 8.0   Glucose, UA NEGATIVE NEGATIVE mg/dL   Hgb urine dipstick SMALL (A) NEGATIVE   Bilirubin Urine NEGATIVE NEGATIVE   Ketones, ur NEGATIVE NEGATIVE mg/dL   Protein, ur NEGATIVE NEGATIVE mg/dL   Nitrite NEGATIVE NEGATIVE   Leukocytes, UA MODERATE (A) NEGATIVE   RBC / HPF 6-10 0 - 5 RBC/hpf   WBC, UA 21-50 0 - 5 WBC/hpf   Bacteria, UA NONE SEEN NONE SEEN   Squamous Epithelial / LPF 0-5 0 - 5   Mucus PRESENT   Type and screen    Result Value Ref Range   ABO/RH(D) B POS    Antibody Screen NEG    Sample Expiration 03/01/2018    Extend sample reason      NO TRANSFUSIONS OR PREGNANCY IN THE PAST 3 MONTHS Performed at [facility], [address] [place], [place] [address]       Assessment & Plan:   Problem List Items Addressed This Visit    None    Visit Diagnoses    Abnormal EKG    -  Primary   Repeat EKG unchanged from prior normal EKG in 2017. No concerning findings, NSR without ST or T wave abnormalities. Clearance form signed and faxed   Relevant Orders   EKG 12-Lead (Completed)       Follow up plan: Return for as scheduled.

## 2018-02-19 NOTE — Patient Instructions (Signed)
**Note De-identified Scholler Obfuscation** Follow up as scheduled.  

## 2018-02-20 ENCOUNTER — Encounter: Payer: Self-pay | Admitting: *Deleted

## 2018-02-20 ENCOUNTER — Encounter
Admission: RE | Disposition: A | Discharge status: Home / Self Care | Payer: Self-pay | Source: Ambulatory Visit | Attending: Obstetrics and Gynecology

## 2018-02-20 ENCOUNTER — Ambulatory Visit: Payer: Medicare Other | Admitting: Certified Registered"

## 2018-02-20 ENCOUNTER — Ambulatory Visit
Admission: RE | Admit: 2018-02-20 | Discharge: 2018-02-20 | Disposition: A | Discharge status: Home / Self Care | Payer: Medicare Other | Source: Ambulatory Visit | Attending: Obstetrics and Gynecology | Admitting: Obstetrics and Gynecology

## 2018-02-20 DIAGNOSIS — Z886 Allergy status to analgesic agent status: Secondary | ICD-10-CM | POA: Insufficient documentation

## 2018-02-20 DIAGNOSIS — Z882 Allergy status to sulfonamides status: Secondary | ICD-10-CM | POA: Insufficient documentation

## 2018-02-20 DIAGNOSIS — C541 Malignant neoplasm of endometrium: Secondary | ICD-10-CM | POA: Diagnosis not present

## 2018-02-20 DIAGNOSIS — Z880 Allergy status to penicillin: Secondary | ICD-10-CM | POA: Insufficient documentation

## 2018-02-20 DIAGNOSIS — M1990 Unspecified osteoarthritis, unspecified site: Secondary | ICD-10-CM | POA: Diagnosis not present

## 2018-02-20 DIAGNOSIS — Z8249 Family history of ischemic heart disease and other diseases of the circulatory system: Secondary | ICD-10-CM | POA: Diagnosis not present

## 2018-02-20 DIAGNOSIS — Z86010 Personal history of colonic polyps: Secondary | ICD-10-CM | POA: Insufficient documentation

## 2018-02-20 DIAGNOSIS — E119 Type 2 diabetes mellitus without complications: Secondary | ICD-10-CM | POA: Diagnosis not present

## 2018-02-20 DIAGNOSIS — E669 Obesity, unspecified: Secondary | ICD-10-CM | POA: Insufficient documentation

## 2018-02-20 DIAGNOSIS — I10 Essential (primary) hypertension: Secondary | ICD-10-CM | POA: Insufficient documentation

## 2018-02-20 DIAGNOSIS — E8881 Metabolic syndrome: Secondary | ICD-10-CM | POA: Insufficient documentation

## 2018-02-20 DIAGNOSIS — Z79899 Other long term (current) drug therapy: Secondary | ICD-10-CM | POA: Insufficient documentation

## 2018-02-20 DIAGNOSIS — E7800 Pure hypercholesterolemia, unspecified: Secondary | ICD-10-CM | POA: Diagnosis not present

## 2018-02-20 DIAGNOSIS — N840 Polyp of corpus uteri: Secondary | ICD-10-CM | POA: Insufficient documentation

## 2018-02-20 DIAGNOSIS — Z91010 Allergy to peanuts: Secondary | ICD-10-CM | POA: Diagnosis not present

## 2018-02-20 DIAGNOSIS — Z6835 Body mass index (BMI) 35.0-35.9, adult: Secondary | ICD-10-CM | POA: Diagnosis not present

## 2018-02-20 DIAGNOSIS — C55 Malignant neoplasm of uterus, part unspecified: Secondary | ICD-10-CM | POA: Diagnosis present

## 2018-02-20 DIAGNOSIS — Z7982 Long term (current) use of aspirin: Secondary | ICD-10-CM | POA: Diagnosis not present

## 2018-02-20 HISTORY — PX: SENTINEL NODE BIOPSY: SHX6608

## 2018-02-20 HISTORY — PX: LAPAROSCOPIC HYSTERECTOMY: SHX1926

## 2018-02-20 LAB — ABO/RH: ABO/RH(D): B POS

## 2018-02-20 LAB — GLUCOSE, CAPILLARY
GLUCOSE-CAPILLARY: 168 mg/dL — AB (ref 70–99)
Glucose-Capillary: 132 mg/dL — ABNORMAL HIGH (ref 70–99)

## 2018-02-20 SURGERY — HYSTERECTOMY, TOTAL, LAPAROSCOPIC
Anesthesia: General | Wound class: Clean Contaminated

## 2018-02-20 MED ORDER — PROPOFOL 10 MG/ML IV BOLUS
INTRAVENOUS | Status: AC
  Filled 2018-02-20: qty 40

## 2018-02-20 MED ORDER — ONDANSETRON HCL 4 MG/2ML IJ SOLN
INTRAMUSCULAR | Status: AC
  Filled 2018-02-20: qty 2

## 2018-02-20 MED ORDER — GABAPENTIN 300 MG PO CAPS
300.0000 mg | ORAL_CAPSULE | ORAL | Status: AC
  Administered 2018-02-20: 300 mg via ORAL

## 2018-02-20 MED ORDER — FENTANYL CITRATE (PF) 100 MCG/2ML IJ SOLN
INTRAMUSCULAR | Status: DC | PRN
  Administered 2018-02-20: 50 ug via INTRAVENOUS
  Administered 2018-02-20 (×2): 100 ug via INTRAVENOUS

## 2018-02-20 MED ORDER — HEPARIN SODIUM (PORCINE) 5000 UNIT/ML IJ SOLN
INTRAMUSCULAR | Status: AC
  Administered 2018-02-20: 5000 [IU] via SUBCUTANEOUS
  Filled 2018-02-20: qty 1

## 2018-02-20 MED ORDER — KETAMINE HCL 10 MG/ML IJ SOLN
INTRAMUSCULAR | Status: DC | PRN
  Administered 2018-02-20: 20 mg via INTRAVENOUS
  Administered 2018-02-20: 30 mg via INTRAVENOUS

## 2018-02-20 MED ORDER — BUPIVACAINE LIPOSOME 1.3 % IJ SUSP
INTRAMUSCULAR | Status: DC | PRN
  Administered 2018-02-20: 20 mL

## 2018-02-20 MED ORDER — CIPROFLOXACIN IN D5W 400 MG/200ML IV SOLN
400.0000 mg | INTRAVENOUS | Status: AC
  Administered 2018-02-20: 400 mg via INTRAVENOUS

## 2018-02-20 MED ORDER — CHLORHEXIDINE GLUCONATE CLOTH 2 % EX PADS: 6.0000 | MEDICATED_PAD | Freq: Once | CUTANEOUS | Status: DC

## 2018-02-20 MED ORDER — HYDROMORPHONE HCL 1 MG/ML IJ SOLN
INTRAMUSCULAR | Status: AC
Start: 2018-02-20 — End: ?
  Filled 2018-02-20: qty 1

## 2018-02-20 MED ORDER — FENTANYL CITRATE (PF) 100 MCG/2ML IJ SOLN: 25.0000 ug | INTRAMUSCULAR | Status: DC | PRN

## 2018-02-20 MED ORDER — HEPARIN SODIUM (PORCINE) 5000 UNIT/ML IJ SOLN
5000.0000 [IU] | Freq: Once | INTRAMUSCULAR | Status: AC
  Administered 2018-02-20: 5000 [IU] via SUBCUTANEOUS

## 2018-02-20 MED ORDER — IBUPROFEN 600 MG PO TABS: 600.0000 mg | ORAL_TABLET | Freq: Four times a day (QID) | ORAL | 1 refills | Status: DC

## 2018-02-20 MED ORDER — SUGAMMADEX SODIUM 200 MG/2ML IV SOLN
INTRAVENOUS | Status: AC
  Filled 2018-02-20: qty 2

## 2018-02-20 MED ORDER — ROCURONIUM BROMIDE 100 MG/10ML IV SOLN
INTRAVENOUS | Status: DC | PRN
  Administered 2018-02-20 (×2): 10 mg via INTRAVENOUS
  Administered 2018-02-20: 5 mg via INTRAVENOUS
  Administered 2018-02-20: 50 mg via INTRAVENOUS

## 2018-02-20 MED ORDER — BUPIVACAINE HCL (PF) 0.5 % IJ SOLN
INTRAMUSCULAR | Status: DC | PRN
  Administered 2018-02-20: 17 mL
  Administered 2018-02-20: 13 mL

## 2018-02-20 MED ORDER — PROPOFOL 10 MG/ML IV BOLUS
INTRAVENOUS | Status: DC | PRN
  Administered 2018-02-20: 150 mg via INTRAVENOUS

## 2018-02-20 MED ORDER — FAMOTIDINE 20 MG PO TABS
20.0000 mg | ORAL_TABLET | Freq: Once | ORAL | Status: AC
  Administered 2018-02-20: 20 mg via ORAL

## 2018-02-20 MED ORDER — HYDROMORPHONE HCL 1 MG/ML IJ SOLN
INTRAMUSCULAR | Status: DC | PRN
  Administered 2018-02-20: 1 mg via INTRAVENOUS

## 2018-02-20 MED ORDER — SUGAMMADEX SODIUM 200 MG/2ML IV SOLN
INTRAVENOUS | Status: DC | PRN
  Administered 2018-02-20: 200 mg via INTRAVENOUS

## 2018-02-20 MED ORDER — LACTATED RINGERS IV SOLN
INTRAVENOUS | Status: DC | PRN
  Administered 2018-02-20 (×2): via INTRAVENOUS

## 2018-02-20 MED ORDER — ONDANSETRON HCL 4 MG/2ML IJ SOLN: 4.0000 mg | Freq: Once | INTRAMUSCULAR | Status: DC | PRN

## 2018-02-20 MED ORDER — ARTIFICIAL TEARS OPHTHALMIC OINT
TOPICAL_OINTMENT | OPHTHALMIC | Status: AC
  Filled 2018-02-20: qty 3.5

## 2018-02-20 MED ORDER — PHENYLEPHRINE HCL 10 MG/ML IJ SOLN
INTRAMUSCULAR | Status: AC
  Filled 2018-02-20: qty 1

## 2018-02-20 MED ORDER — KETOROLAC TROMETHAMINE 30 MG/ML IJ SOLN
INTRAMUSCULAR | Status: AC
  Filled 2018-02-20: qty 1

## 2018-02-20 MED ORDER — LIDOCAINE HCL (CARDIAC) PF 100 MG/5ML IV SOSY
PREFILLED_SYRINGE | INTRAVENOUS | Status: DC | PRN
  Administered 2018-02-20: 60 mg via INTRAVENOUS

## 2018-02-20 MED ORDER — GABAPENTIN 300 MG PO CAPS
ORAL_CAPSULE | ORAL | Status: AC
  Administered 2018-02-20: 300 mg via ORAL
  Filled 2018-02-20: qty 1

## 2018-02-20 MED ORDER — DEXAMETHASONE SODIUM PHOSPHATE 10 MG/ML IJ SOLN
INTRAMUSCULAR | Status: DC | PRN
  Administered 2018-02-20: 5 mg via INTRAVENOUS

## 2018-02-20 MED ORDER — CIPROFLOXACIN IN D5W 400 MG/200ML IV SOLN
INTRAVENOUS | Status: AC
  Filled 2018-02-20: qty 200

## 2018-02-20 MED ORDER — INDOCYANINE GREEN 25 MG IV SOLR
INTRAVENOUS | Status: AC
  Filled 2018-02-20: qty 25

## 2018-02-20 MED ORDER — FAMOTIDINE 20 MG PO TABS
ORAL_TABLET | ORAL | Status: AC
  Administered 2018-02-20: 20 mg via ORAL
  Filled 2018-02-20: qty 1

## 2018-02-20 MED ORDER — PHENYLEPHRINE HCL 10 MG/ML IJ SOLN
INTRAMUSCULAR | Status: DC | PRN
  Administered 2018-02-20: 200 ug via INTRAVENOUS
  Administered 2018-02-20: 100 ug via INTRAVENOUS
  Administered 2018-02-20: 300 ug via INTRAVENOUS
  Administered 2018-02-20 (×2): 200 ug via INTRAVENOUS

## 2018-02-20 MED ORDER — SODIUM CHLORIDE 0.9 % IV SOLN
INTRAVENOUS | Status: DC
  Administered 2018-02-20: 07:00:00 via INTRAVENOUS

## 2018-02-20 MED ORDER — SODIUM CHLORIDE 0.9 % IV SOLN
INTRAVENOUS | Status: DC | PRN
  Administered 2018-02-20: 25 ug/min via INTRAVENOUS

## 2018-02-20 MED ORDER — ACETAMINOPHEN 10 MG/ML IV SOLN
INTRAVENOUS | Status: DC | PRN
  Administered 2018-02-20: 1000 mg via INTRAVENOUS

## 2018-02-20 MED ORDER — ONDANSETRON HCL 4 MG/2ML IJ SOLN
INTRAMUSCULAR | Status: DC | PRN
  Administered 2018-02-20: 4 mg via INTRAVENOUS

## 2018-02-20 MED ORDER — MIDAZOLAM HCL 2 MG/2ML IJ SOLN
INTRAMUSCULAR | Status: AC
  Filled 2018-02-20: qty 2

## 2018-02-20 MED ORDER — IPRATROPIUM-ALBUTEROL 0.5-2.5 (3) MG/3ML IN SOLN
RESPIRATORY_TRACT | Status: AC
  Administered 2018-02-20: 3 mL via RESPIRATORY_TRACT
  Filled 2018-02-20: qty 3

## 2018-02-20 MED ORDER — FLUMAZENIL 0.5 MG/5ML IV SOLN
INTRAVENOUS | Status: AC
  Administered 2018-02-20: 0.2 mg via INTRAVENOUS
  Filled 2018-02-20: qty 5

## 2018-02-20 MED ORDER — ACETAMINOPHEN NICU IV SYRINGE 10 MG/ML
INTRAVENOUS | Status: AC
  Filled 2018-02-20: qty 1

## 2018-02-20 MED ORDER — ROCURONIUM BROMIDE 50 MG/5ML IV SOLN
INTRAVENOUS | Status: AC
  Filled 2018-02-20: qty 2

## 2018-02-20 MED ORDER — IPRATROPIUM-ALBUTEROL 0.5-2.5 (3) MG/3ML IN SOLN
3.0000 mL | Freq: Once | RESPIRATORY_TRACT | Status: AC
  Administered 2018-02-20: 3 mL via RESPIRATORY_TRACT

## 2018-02-20 MED ORDER — KETAMINE HCL 50 MG/ML IJ SOLN
INTRAMUSCULAR | Status: AC
  Filled 2018-02-20: qty 10

## 2018-02-20 MED ORDER — FENTANYL CITRATE (PF) 250 MCG/5ML IJ SOLN
INTRAMUSCULAR | Status: AC
  Filled 2018-02-20: qty 5

## 2018-02-20 MED ORDER — OXYCODONE HCL 5 MG PO TABS: 5.0000 mg | ORAL_TABLET | ORAL | 0 refills | Status: DC | PRN

## 2018-02-20 MED ORDER — DEXAMETHASONE SODIUM PHOSPHATE 10 MG/ML IJ SOLN
INTRAMUSCULAR | Status: AC
  Filled 2018-02-20: qty 1

## 2018-02-20 MED ORDER — BUPIVACAINE LIPOSOME 1.3 % IJ SUSP
INTRAMUSCULAR | Status: AC
  Filled 2018-02-20: qty 20

## 2018-02-20 MED ORDER — BUPIVACAINE HCL (PF) 0.5 % IJ SOLN
INTRAMUSCULAR | Status: AC
  Filled 2018-02-20: qty 30

## 2018-02-20 MED ORDER — KETOROLAC TROMETHAMINE 30 MG/ML IJ SOLN
INTRAMUSCULAR | Status: DC | PRN
  Administered 2018-02-20: 30 mg via INTRAVENOUS

## 2018-02-20 MED ORDER — GLYCOPYRROLATE 0.2 MG/ML IJ SOLN
INTRAMUSCULAR | Status: AC
  Filled 2018-02-20: qty 1

## 2018-02-20 MED ORDER — METRONIDAZOLE IN NACL 5-0.79 MG/ML-% IV SOLN
500.0000 mg | INTRAVENOUS | Status: AC
  Administered 2018-02-20: 500 mg via INTRAVENOUS
  Filled 2018-02-20: qty 100

## 2018-02-20 MED ORDER — GLYCOPYRROLATE 0.2 MG/ML IJ SOLN
INTRAMUSCULAR | Status: DC | PRN
  Administered 2018-02-20: 0.1 mg via INTRAVENOUS

## 2018-02-20 MED ORDER — FLUMAZENIL 0.5 MG/5ML IV SOLN
0.2000 mg | Freq: Once | INTRAVENOUS | Status: AC
  Administered 2018-02-20: 0.2 mg via INTRAVENOUS

## 2018-02-20 MED ORDER — MIDAZOLAM HCL 2 MG/2ML IJ SOLN
INTRAMUSCULAR | Status: DC | PRN
  Administered 2018-02-20: 2 mg via INTRAVENOUS

## 2018-02-20 MED ORDER — INDOCYANINE GREEN 25 MG IV SOLR
INTRAVENOUS | Status: DC | PRN
  Administered 2018-02-20: 25 mg

## 2018-02-20 MED ORDER — LIDOCAINE HCL (PF) 2 % IJ SOLN
INTRAMUSCULAR | Status: AC
  Filled 2018-02-20: qty 10

## 2018-02-20 SURGICAL SUPPLY — 68 items
APPLICATOR SURGIFLO ENDO (HEMOSTASIS) IMPLANT
BAG URINE DRAINAGE (UROLOGICAL SUPPLIES) ×3 IMPLANT
BLADE SURG 15 STRL LF DISP TIS (BLADE) ×1 IMPLANT
BLADE SURG 15 STRL SS (BLADE) ×2
BLADE SURG SZ10 CARB STEEL (BLADE) ×3 IMPLANT
CANISTER SUCT 1200ML W/VALVE (MISCELLANEOUS) ×3 IMPLANT
CANNULA DILATOR  5MM W/SLV (CANNULA)
CANNULA DILATOR 5 W/SLV (CANNULA) IMPLANT
CATH FOLEY 2WAY  5CC 16FR (CATHETERS) ×2
CATH URTH 16FR FL 2W BLN LF (CATHETERS) ×1 IMPLANT
CHLORAPREP W/TINT 26ML (MISCELLANEOUS) ×3 IMPLANT
CORD MONOPOLAR M/FML 12FT (MISCELLANEOUS) ×3 IMPLANT
DEFOGGER SCOPE WARMER CLEARIFY (MISCELLANEOUS) ×3 IMPLANT
DERMABOND ADVANCED (GAUZE/BANDAGES/DRESSINGS) ×2
DERMABOND ADVANCED .7 DNX12 (GAUZE/BANDAGES/DRESSINGS) ×1 IMPLANT
DEVICE SUTURE ENDOST 10MM (ENDOMECHANICALS) ×3 IMPLANT
DRAPE STERI POUCH LG 24X46 STR (DRAPES) ×6 IMPLANT
GLOVE BIO SURGEON STRL SZ8 (GLOVE) ×15 IMPLANT
GLOVE INDICATOR 8.0 STRL GRN (GLOVE) ×15 IMPLANT
GOWN STRL REUS W/ TWL LRG LVL3 (GOWN DISPOSABLE) ×2 IMPLANT
GOWN STRL REUS W/ TWL XL LVL3 (GOWN DISPOSABLE) ×1 IMPLANT
GOWN STRL REUS W/TWL LRG LVL3 (GOWN DISPOSABLE) ×4
GOWN STRL REUS W/TWL XL LVL3 (GOWN DISPOSABLE) ×2
GRASPER SUT TROCAR 14GX15 (MISCELLANEOUS) ×3 IMPLANT
HANDLE YANKAUER SUCT BULB TIP (MISCELLANEOUS) ×3 IMPLANT
IRRIGATION STRYKERFLOW (MISCELLANEOUS) IMPLANT
IRRIGATOR STRYKERFLOW (MISCELLANEOUS)
IV LACTATED RINGERS 1000ML (IV SOLUTION) ×3 IMPLANT
KIT PINK PAD W/HEAD ARE REST (MISCELLANEOUS) ×3
KIT PINK PAD W/HEAD ARM REST (MISCELLANEOUS) ×1 IMPLANT
LABEL OR SOLS (LABEL) ×3 IMPLANT
LIGASURE VESSEL 5MM BLUNT TIP (ELECTROSURGICAL) ×3 IMPLANT
MANIPULATOR VCARE LG CRV RETR (MISCELLANEOUS) IMPLANT
MANIPULATOR VCARE SML CRV RETR (MISCELLANEOUS) ×3 IMPLANT
MANIPULATOR VCARE STD CRV RETR (MISCELLANEOUS) IMPLANT
NDL INSUFF ACCESS 14 VERSASTEP (NEEDLE) ×3 IMPLANT
NEEDLE SPNL 22GX5 LNG QUINC BK (NEEDLE) ×3 IMPLANT
NS IRRIG 500ML POUR BTL (IV SOLUTION) ×3 IMPLANT
OCCLUDER COLPOPNEUMO (BALLOONS) ×3 IMPLANT
PACK GYN LAPAROSCOPIC (MISCELLANEOUS) ×3 IMPLANT
PAD OB MATERNITY 4.3X12.25 (PERSONAL CARE ITEMS) ×3 IMPLANT
PAD PREP 24X41 OB/GYN DISP (PERSONAL CARE ITEMS) ×3 IMPLANT
PAPER ECG REC THERMAL (MISCELLANEOUS) ×3 IMPLANT
PENCIL ELECTRO HAND CTR (MISCELLANEOUS) ×3 IMPLANT
POUCH ENDO CATCH II 15MM (MISCELLANEOUS) ×3 IMPLANT
SET CYSTO W/LG BORE CLAMP LF (SET/KITS/TRAYS/PACK) ×3 IMPLANT
SET TRI-LUMEN FLTR TB AIRSEAL (TUBING) ×3 IMPLANT
SHEARS ENDO 5MM 31CM (CUTTER) ×3 IMPLANT
SPOGE SURGIFLO 8M (HEMOSTASIS)
SPONGE LAP 18X18 RF (DISPOSABLE) ×3 IMPLANT
SPONGE LAP 4X18 RFD (DISPOSABLE) IMPLANT
SPONGE SURGIFLO 8M (HEMOSTASIS) IMPLANT
SUT ENDO VLOC 180-0-8IN (SUTURE) ×3 IMPLANT
SUT MNCRL 4-0 (SUTURE) ×4
SUT MNCRL 4-0 27XMFL (SUTURE) ×2
SUT VIC AB 0 CT1 36 (SUTURE) ×21 IMPLANT
SUT VIC AB 2-0 UR6 27 (SUTURE) ×3 IMPLANT
SUTURE MNCRL 4-0 27XMF (SUTURE) ×2 IMPLANT
SYR 10ML LL (SYRINGE) ×3 IMPLANT
SYR 3ML LL SCALE MARK (SYRINGE) ×6 IMPLANT
SYR 50ML LL SCALE MARK (SYRINGE) ×6 IMPLANT
TROCAR BLUNT TIP 12MM OMST12BT (TROCAR) ×3 IMPLANT
TROCAR PORT AIRSEAL 5X120 (TROCAR) ×3 IMPLANT
TROCAR VERSASTEP PLUS 12MM (TROCAR) IMPLANT
TROCAR VERSASTEP PLUS 5MM (TROCAR) ×3 IMPLANT
TUBING CONNECTING 10 (TUBING) ×2 IMPLANT
TUBING CONNECTING 10' (TUBING) ×1
TUBING INSUF HEATED (TUBING) IMPLANT

## 2018-02-20 NOTE — Op Note (Signed)
**Note De-Identified [place] Obfuscation** Operative Note   02/20/2018 10:53 AM  PRE-OP DIAGNOSIS: UTERINE CANCER    POST-OP DIAGNOSIS: Grade 1 endometrial adenocarcinoma, uterine fibroids  SURGEON: Surgeon(s) and Role:    Mellody Drown, MD - Primary    * Ward, Honor Loh, MD - Assisting.  ANESTHESIA: General ET  PROCEDURE: HYSTERECTOMY TOTAL LAPAROSCOPIC WITH BILATERAL SALPINGOOPHORECTOMY, PELVIC SENTINEL NODE BIOPSIES AND MAPPING, MINILAPAROTOMY  ESTIMATED BLOOD LOSS: less than 50 mL  DRAINS: NONE   TOTAL IV FLUIDS: Per Anesthesia  SPECIMENS:   COMPLICATIONS: nopne  DISPOSITION: PACU - hemodynamically stable.  CONDITION: stable  INDICATIONS: Grade 1 endometrial cancer  FINDINGS: The uterus was enlarged to about 12 weeks size due to myomas.  This made the surgery more difficult due to challenges with exposure.  The frozen section showed minimally invasive grade 1 adenocarcinoma.  Other pelvic organs were normal including adnexa.  PROCEDURE IN DETAIL: After informed consent was obtained, the patient was taken to the operating room where anesthesia was obtained without difficulty. The patient was positioned in the dorsal lithotomy position in [place] and her arms were carefully tucked at her sides and the usual precautions were taken.  She was prepped and draped in normal sterile fashion.  Time-out was performed and a Foley catheter was placed into the bladder and the cervix was infiltrated with 4 ml of ICG fluorescent dye at 3 an 9 o'clock both superficial and deep injections. A small VCare uterine manipulator was then placed in the uterus without incident.    An open Hasson technique was used to place an infraumbilical 32-DJ baloon trocar under direct visualization. The laparoscope was introduced and CO2 gas was infused for pneumoperitoneum to a pressure of 15 mm Hg.  Right and left lateral 5-mm ports (airseal on the left) and a 5-12 mm suprapubic port were placed under direct visualization of the laparoscope  using an EndoStep technique.  Cytologic washings were obtained.  The patient was placed in Trendelenburg and the bowel was displaced up into the upper abdomen.    The hysterectomy and BSO were difficult due to challenging exposure related to fibroids.  Round ligaments were divided on each side with the EndoShears and the retroperitoneal space was opened bilaterally.  The ureters were identified and preserved.  At this point the retroperitoneal spaces were developed and the lymphatic channels were mapped to each side.  The external iliac sentinel node on the right side was then identified, skeletonized and removed taking care not to injure the  obturator nerve, the ureter or the pelvic vasculature.  Similarly on the left side, the retroperitoneal spaces were developed, the lymphatic channels mapped to identify the left external iliac sentinel node and it was similarly removed with care to preserve the obturator nerve, the ureter and the pelvic vasculature. With hemostasis secured, the infundibulopelvic ligaments were skeletonized, sealed and divided with the LigaSure device.  A bladder flap was created and the bladder was dissected down off the lower uterine segment and cervix using endoshears and electrocautery.  The uterine arteries were skeletonized bilaterally, sealed and divided with the LigaSure device.  A colpotomy was performed circumferentially along the V-Care ring with electrocautery and the cervix was incised from the vagina.  The specimen was placed in an endocatch bag hrough the vagina, but was too large to deliver, so the bag was closed with a tie and placed in the abdomen.  .  A pneumo balloon was placed in the vagina and the vaginal cuff was then closed in a **Note De-Identified Chaffin Obfuscation** running continuous fashion using the EndoStitch technique with 0 V-Lock suture with careful attention to include the vaginal cuff angles and the vaginal mucosa within the closure.    A minilaparotomy of 8 cm was then done in a transverse  fashion 3 FB above the symphysis extending the existing 5-12 port incision.  The skin was incised with the knife and then the fat and fascia was taken down with the Bovie.  The peritoneum was entered Farrier the existing hole and the specimen removed in the bag.  The fascia was then closed with running O Vicryl sutures tied in the midline.  Fat was closed with interrupted 3-0 plain sutures. Intraoperative pathologic evaluation revealed favorable risk criteria and therefore further dissection was not performed.   The laparoscope was reintroduced and the intraperitoneal pressure was dropped, and all planes of dissection, vascular pedicles and the vaginal cuff were found to be hemostatic.  The lateral trocars were removed and the fascia was closed with 0 Vicryl suture using the Endoclose technique. Before the umbilical trocar was removed the CO2 gas was released.  The fascia there was closed with 0 Vicryl suture in interrupted figure of eight technique.   The skin incisions were closed with subcuticular stitches and glue. The patient tolerated the procedure well.  Sponge, lap and needle counts were correct x2.  The patient was taken to recovery room in excellent condition.  Antibiotics: Cipro/Flagyl   VTE prophylaxis: SCDs and SQ heparin ordered perioperatively.   Mellody Drown, MD

## 2018-02-20 NOTE — Transfer of Care (Signed)
**Note De-Identified [place]acek Obfuscation** Immediate Anesthesia Transfer of Care Note  Patient: Suzanne Jennings  Procedure(s) Performed: HYSTERECTOMY TOTAL LAPAROSCOPIC (N/A ) SENTINEL NODE BIOPSY AND MAPPING (N/A )  Patient Location: PACU  Anesthesia Type:General  Level of Consciousness: drowsy and patient cooperative  Airway & Oxygen Therapy: Patient Spontanous Breathing and Patient connected to face mask oxygen  Post-op Assessment: Report given to RN, Post -op Vital signs reviewed and stable and Patient moving all extremities  Post vital signs: Reviewed and stable  Last Vitals:  Vitals Value Taken Time  BP 131/64 02/20/2018 11:04 AM  Temp    Pulse 66 02/20/2018 11:05 AM  Resp 11 02/20/2018 11:05 AM  SpO2 97 % 02/20/2018 11:05 AM  Vitals shown include unvalidated device data.  Last Pain:  Vitals:   02/20/18 0609  TempSrc: Tympanic  PainSc: 0-No pain         Complications: No apparent anesthesia complications

## 2018-02-20 NOTE — Discharge Instructions (Signed)
**Note De-Identified [place] Obfuscation** Discharge instructions:  Call office if you have any of the following: fever >101 F, chills, excessive vaginal bleeding, incision drainage or problems, leg pain or redness, or any other concerns.   Activity: Do not lift > 10 lbs for 8 weeks.  No intercourse or tampons for 8 weeks.  No driving for 1-2 weeks.   You may feel some pain in your upper right abdomen/rib and right shoulder.  This is from the gas in the abdomen for surgery. This will subside over time, please be patient!  Take 600mg  Ibuprofen and 1000mg  Tylenol around the clock, every 6 hours for at least the first 3-5 days.  After this you can take as needed.  This will help decrease inflammation and promote healing.  The narcotics you'll take just as needed, as they just trick your brain into thinking its not in pain.    Please don't limit yourself in terms of routine activity.  You will be able to do most things, although they may take longer to do or be a little painful.  You can do it!  Don't be a hero, but don't be a wimp either!    AMBULATORY SURGERY  DISCHARGE INSTRUCTIONS   1) The drugs that you were given will stay in your system until tomorrow so for the next 24 hours you should not:  A) Drive an automobile B) Make any legal decisions C) Drink any alcoholic beverage   2) You may resume regular meals tomorrow.  Today it is better to start with liquids and gradually work up to solid foods.  You may eat anything you prefer, but it is better to start with liquids, then soup and crackers, and gradually work up to solid foods.   3) Please notify your doctor immediately if you have any unusual bleeding, trouble breathing, redness and pain at the surgery site, drainage, fever, or pain not relieved by medication.    4) Additional Instructions:        Please contact your physician with any problems or Same Day Surgery at [phone], Monday through Friday 6 am to 4 pm, or [place] [facility] number at  [phone].

## 2018-02-20 NOTE — Anesthesia Procedure Notes (Signed)
**Note De-Identified [place] Obfuscation** Procedure Name: Intubation Date/Time: 02/20/2018 7:46 AM Performed by: Nile Riggs, CRNA Pre-anesthesia Checklist: Patient identified, Emergency Drugs available, Patient being monitored, Timeout performed and Suction available Patient Re-evaluated:Patient Re-evaluated prior to induction Oxygen Delivery Method: Circle system utilized Preoxygenation: Pre-oxygenation with 100% oxygen Induction Type: IV induction and Cricoid Pressure applied Ventilation: Mask ventilation without difficulty and Oral airway inserted - appropriate to patient size Laryngoscope Size: Mac and 3 Grade View: Grade II Tube type: Oral Tube size: 7.5 mm Number of attempts: 2 Airway Equipment and Method: Stylet Placement Confirmation: ETT inserted through vocal cords under direct vision,  positive ETCO2,  CO2 detector and breath sounds checked- equal and bilateral Secured at: 21 cm Tube secured with: Tape Dental Injury: Injury to lip  Comments: Small cut to inner upper lip cleaned and moisturized with vaseline jelly

## 2018-02-20 NOTE — Anesthesia Preprocedure Evaluation (Signed)
**Note De-Identified [place]gnib[place] Obfuscation** Anesthesia Evaluation  Patient identified by MRN, date of birth, ID band Patient awake    Reviewed: Allergy & Precautions, H&P , NPO status , Patient's Chart, lab work & pertinent test results, reviewed documented beta blocker date and time   Airway Mallampati: II  TM Distance: <3 FB Neck ROM: full    Dental no notable dental hx.    Pulmonary neg pulmonary ROS,    Pulmonary exam normal breath sounds clear to auscultation       Cardiovascular Exercise Tolerance: Good hypertension,  Rhythm:regular Rate:Normal  Hypercholesteremia and metabolic syndrome   Neuro/Psych negative neurological ROS  negative psych ROS   GI/Hepatic negative GI ROS, Neg liver ROS,   Endo/Other  negative endocrine ROSdiabetes  Renal/GU negative Renal ROS  negative genitourinary   Musculoskeletal  (+) Arthritis , Osteoarthritis,    Abdominal   Peds negative pediatric ROS (+)  Hematology negative hematology ROS (+)   Anesthesia Other Findings   Reproductive/Obstetrics negative OB ROS                             Anesthesia Physical  Anesthesia Plan  ASA: II  Anesthesia Plan: General   Post-op Pain Management:    Induction:   PONV Risk Score and Plan:   Airway Management Planned: Oral ETT  Additional Equipment:   Intra-op Plan:   Post-operative Plan: Extubation in OR  Informed Consent: I have reviewed the patients History and Physical, chart, labs and discussed the procedure including the risks, benefits and alternatives for the proposed anesthesia with the patient or authorized representative who has indicated his/her understanding and acceptance.   Dental Advisory Given  Plan Discussed with: CRNA and Anesthesiologist  Anesthesia Plan Comments:         Anesthesia Quick Evaluation

## 2018-02-20 NOTE — Interval H&P Note (Signed)
**Note De-Identified Venus Obfuscation** History and Physical Interval Note:  02/20/2018 7:07 AM  Suzanne Jennings  has presented today for surgery, with the diagnosis of UTERINE CANCER  The various methods of treatment have been discussed with the patient and family. After consideration of risks, benefits and other options for treatment, the patient has consented to  Procedure(s): HYSTERECTOMY TOTAL LAPAROSCOPIC (N/A) SENTINEL NODE BIOPSY AND MAPPING (N/A) as a surgical intervention .  The patient's history has been reviewed, patient examined, no change in status, stable for surgery.  I have reviewed the patient's chart and labs.  Questions were answered to the patient's satisfaction.     Mellody Drown

## 2018-02-20 NOTE — Anesthesia Postprocedure Evaluation (Signed)
**Note De-Identified [name]** Anesthesia Post Note  Patient: Suzanne Jennings  Procedure(s) Performed: HYSTERECTOMY TOTAL LAPAROSCOPIC, WITH BILATERAL SALPINGOOPHERECTOMY (N/A ) SENTINEL NODE BIOPSY AND MAPPING (N/A )  Patient location during evaluation: PACU Anesthesia Type: General Level of consciousness: awake and alert and oriented Pain management: pain level controlled Vital Signs Assessment: post-procedure vital signs reviewed and stable Respiratory status: spontaneous breathing Cardiovascular status: blood pressure returned to baseline Anesthetic complications: no     Last Vitals:  Vitals:   02/20/18 1319 02/20/18 1327  BP: 135/65 (!) 143/95  Pulse: 75 (!) 114  Resp: 16 16  Temp: 36.4 C 36.4 C  SpO2: 95% 95%    Last Pain:  Vitals:   02/20/18 1327  TempSrc: Temporal  PainSc: 0-No pain                 Kitzia Camus

## 2018-02-20 NOTE — Anesthesia Post-op Follow-up Note (Signed)
**Note De-identified [place] Obfuscation** Anesthesia QCDR form completed.        

## 2018-02-21 ENCOUNTER — Encounter: Payer: Self-pay | Admitting: Obstetrics and Gynecology

## 2018-02-21 ENCOUNTER — Other Ambulatory Visit: Payer: Self-pay | Admitting: Unknown Physician Specialty

## 2018-02-22 LAB — CYTOLOGY - NON PAP

## 2018-02-26 ENCOUNTER — Ambulatory Visit
Admission: RE | Admit: 2018-02-26 | Discharge: 2018-02-26 | Disposition: A | Discharge status: Home / Self Care | Payer: Medicare Other | Source: Ambulatory Visit | Attending: Nurse Practitioner | Admitting: Nurse Practitioner

## 2018-02-26 DIAGNOSIS — K8020 Calculus of gallbladder without cholecystitis without obstruction: Secondary | ICD-10-CM | POA: Insufficient documentation

## 2018-02-26 DIAGNOSIS — R918 Other nonspecific abnormal finding of lung field: Secondary | ICD-10-CM | POA: Diagnosis not present

## 2018-02-26 DIAGNOSIS — D1771 Benign lipomatous neoplasm of kidney: Secondary | ICD-10-CM | POA: Insufficient documentation

## 2018-02-26 DIAGNOSIS — I700 Atherosclerosis of aorta: Secondary | ICD-10-CM | POA: Insufficient documentation

## 2018-02-27 ENCOUNTER — Telehealth: Payer: Self-pay | Admitting: Nurse Practitioner

## 2018-02-27 NOTE — Telephone Encounter (Signed)
**Note De-Identified [place] Obfuscation** Called patient to discuss results of recent CT scan. She is recovering from surgery well and reports pain is well controlled. At her next appointment we will order and schedule recommended follow up CT scan.

## 2018-02-28 LAB — SURGICAL PATHOLOGY

## 2018-03-18 ENCOUNTER — Other Ambulatory Visit: Payer: Self-pay

## 2018-03-20 ENCOUNTER — Encounter: Payer: Self-pay | Admitting: Obstetrics and Gynecology

## 2018-03-20 ENCOUNTER — Inpatient Hospital Stay: Payer: Medicare Other | Attending: Obstetrics and Gynecology | Admitting: Obstetrics and Gynecology

## 2018-03-20 VITALS — BP 149/79 | HR 99 | Temp 98.7°F | Resp 18 | Ht 61.0 in | Wt 184.0 lb

## 2018-03-20 DIAGNOSIS — Z90710 Acquired absence of both cervix and uterus: Secondary | ICD-10-CM

## 2018-03-20 DIAGNOSIS — C541 Malignant neoplasm of endometrium: Secondary | ICD-10-CM | POA: Insufficient documentation

## 2018-03-20 DIAGNOSIS — Z90722 Acquired absence of ovaries, bilateral: Secondary | ICD-10-CM | POA: Insufficient documentation

## 2018-03-20 DIAGNOSIS — R918 Other nonspecific abnormal finding of lung field: Secondary | ICD-10-CM

## 2018-03-20 NOTE — Progress Notes (Signed)
**Note De-Identified [place] Obfuscation** Chaperoned pelvic exam. Follow up with Dr. Leonides Schanz in 6 months and Gyn Onc in one year. Oncology Nurse Navigator Documentation  Navigator Location: [facility] (03/20/18 1100)   )Navigator Encounter Type: Follow-up Appt (03/20/18 1100)                     Patient Visit Type: GynOnc (03/20/18 1100)                              Time Spent with Patient: 15 (03/20/18 1100)

## 2018-03-20 NOTE — Progress Notes (Signed)
**Note De-Identified Michael Obfuscation** Gynecologic Oncology Interval Visit   Referring Provider: Dr. Amalia Hailey  Chief Concern: Grade 1 endometrial cancer  Subjective:  Suzanne Jennings is a 67 y.o. P2 female who was initially seen in consultation for Dr. Amalia Hailey for FIGO IA endometrioid cancer. She presents to clinic today for post-op evaluation and discussion of pathology results.   On 02/20/18, she underwent TLH, BSO with pelvic SLN mapping and biopsies, minilaparotomy with Dr. Fransisca Connors and Dr. Leonides Schanz at [facility]. Uterus was enlarged to about 12 week size d/t size of myomas.   DIAGNOSIS:  A. UTERUS WITH CERVIX; HYSTERECTOMY:  - ENDOMETRIOID CARCINOMA, FIGO 1.  - NEGATIVE FOR MYOMETRIAL INVASION, CERVICAL INVOLVEMENT, AND SEROSAL INVOLVEMENT.  - MULTIPLE SUBSEROSAL, INTRAMURAL, AND SUBMUCOSAL LEIOMYOMAS (404 GRAM UTERUS).   BILATERAL FALLOPIAN TUBES AND OVARIES; SALPINGO-OOPHORECTOMY:  - NEGATIVE FOR MALIGNANCY.  - ATROPHY.   B. SENTINEL LYMPH NODE, RIGHT PELVIC; EXCISION:  - NEGATIVE FOR MALIGNANCY, ONE LYMPH NODE (0/1).   C. SENTINEL LYMPH NODE, LEFT PELVIC; EXCISION:  - NEGATIVE FOR MALIGNANCY, ONE LYMPH NODE (0/1).  Comment:  The bulk of the endometrial carcinoma involves an endometrial polyp. The background endometrium exhibits atypical hyperplasia/EIN and focal carcinoma.  A. PELVIC WASHINGS:  - NEGATIVE FOR MALIGNANCY.  - MESOTHELIAL CELLS AND MACROPHAGES.  Immunohistochemistry (IHC) Testing for DNA Mismatch Repair (MMR)  Proteins:  Results:  MLH1: Intact nuclear expression  MSH2: Intact nuclear expression  MSH6: Intact nuclear expression  PMS2: Intact nuclear expression  IHC Interpretation: No loss of nuclear expression of MMR proteins: Low probability of MSI-H.   Today, she reports healing well from surgery.    Gynecologic Oncology History:  Patient initially presented with report of 3 weeks of vaginal bleeding. Endometrial biopsy 01/17/18 by Dr Amalia Hailey showed grade 1 endometrial cancer with focal polyp.   CXR  02/15/18:  IMPRESSION: 6 mm nodular opacity right perihilar region. Advise noncontrast enhanced chest CT to further assess. Lungs elsewhere clear.  Chest CT 7/19 Calcified RIGHT upper lobe granuloma accounting for chest radiograph finding with associated calcified RIGHT hilar and mediastinal nodes.  Additional nonspecific ground-glass opacities up to 5 mm diameter in RIGHT lung, recommendation below.  Non-contrast chest CT at 3-6 months is recommended. If nodules persist and are stable at that time, consider additional non-contrast chest CT examinations at 2 and 4 years. This recommendation follows the consensus statement: Guidelines for Management of Incidental Pulmonary Nodules Detected on CT Images: From the Fleischner Society 2017; Radiology 2017; 284:228-243.   Problem List: Patient Active Problem List   Diagnosis Date Noted  . Post-menopausal bleeding 12/10/2017  . Type 2 diabetes mellitus without complications ([place]) 17/61/6073  . Advanced care planning/counseling discussion 03/12/2017  . Benign neoplasm of descending colon   . Polyp of sigmoid colon   . Benign neoplasm of cecum   . Metabolic syndrome 71/01/2693  . 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 Hypercholesteremia 09/21/2015  . Hypertension 08/30/2015  . Osteoarthritis (arthritis due to wear and tear of joints) 08/30/2015  . Obesity 08/30/2015    Past Medical History: Past Medical History:  Diagnosis Date  . Diabetes mellitus without complication ([place])   . Endometrial cancer ([place])   . History of kidney stones   . Hyperlipidemia   . Hypertension   . Kidney stone   . Knee pain     Past Surgical History: Past Surgical History:  Procedure Laterality Date  . APPENDECTOMY    . COLONOSCOPY WITH PROPOFOL N/A 07/03/2016   Procedure: COLONOSCOPY WITH PROPOFOL;  Surgeon: Lucilla Lame, **Note De-Identified Kari Obfuscation** MD;  Location: [facility];  Service: Endoscopy;  Laterality: N/A;  . LAPAROSCOPIC HYSTERECTOMY N/A 02/20/2018    Procedure: HYSTERECTOMY TOTAL LAPAROSCOPIC, WITH BILATERAL SALPINGOOPHERECTOMY;  Surgeon: Mellody Drown, MD;  Location: [facility];  Service: Gynecology;  Laterality: N/A;  . POLYPECTOMY  07/03/2016   Procedure: POLYPECTOMY;  Surgeon: Lucilla Lame, MD;  Location: [facility];  Service: Endoscopy;;  . SENTINEL NODE BIOPSY N/A 02/20/2018   Procedure: SENTINEL NODE BIOPSY AND MAPPING;  Surgeon: Mellody Drown, MD;  Location: [facility];  Service: Gynecology;  Laterality: N/A;  . TUBAL LIGATION      OB History:  OB History  Gravida Para Term Preterm AB Living  _0 SAB TAB Ectopic Multiple Live Births          2    # Outcome Date GA Lbr Len/2nd Weight Sex Delivery Anes PTL Lv  2 Term 1979    M Vag-Spont   LIV  1 Preterm 1974    F Vag-Spont   LIV    Family History: Family History  Problem Relation Age of Onset  . Emphysema Mother   . Heart disease Father        massive MI  . Hypertension Sister   . Mental illness Brother   . Hypertension Sister   . Hypertension Sister   . Diabetes Maternal Grandmother   . Breast cancer Neg Hx     Social History: Social History   Socioeconomic History  . Marital status: Married    Spouse name: Not on file  . Number of children: Not on file  . Years of education: Not on file  . Highest education level: Not on file  Occupational History  . Not on file  Social Needs  . Financial resource strain: Not on file  . Food insecurity:    Worry: Not on file    Inability: Not on file  . Transportation needs:    Medical: Not on file    Non-medical: Not on file  Tobacco Use  . Smoking status: Never Smoker  . Smokeless tobacco: Never Used  Substance and Sexual Activity  . Alcohol use: No    Alcohol/week: 0.0 oz  . Drug use: No  . Sexual activity: Yes    Birth control/protection: Post-menopausal  Lifestyle  . Physical activity:    Days per week: Not on file    Minutes per session: Not on file  . Stress: Not on file   Relationships  . Social connections:    Talks on phone: Not on file    Gets together: Not on file    Attends religious service: Not on file    Active member of club or organization: Not on file    Attends meetings of clubs or organizations: Not on file    Relationship status: Not on file  . Intimate partner violence:    Fear of current or ex partner: Not on file    Emotionally abused: Not on file    Physically abused: Not on file    Forced sexual activity: Not on file  Other Topics Concern  . Not on file  Social History Narrative  . Not on file    Allergies: Allergies  Allergen Reactions  . Aspirin Other (See Comments)    "blood thinner, makes nose bleed - but pt can take 81 mg dose without problems  . Other Other (See Comments)    Yogurt causes yeast infections Nuts except peanuts and **Note De-Identified Castagna Obfuscation** almonds cause anaphylaxis   . Penicillins Swelling and Rash    Has patient had a PCN reaction causing immediate rash, facial/tongue/throat swelling, SOB or lightheadedness with hypotension: Yes Has patient had a PCN reaction causing severe rash involving mucus membranes or skin necrosis: No Has patient had a PCN reaction that required hospitalization: No Has patient had a PCN reaction occurring within the last 10 years: No If all of the above answers are "NO", then may proceed with Cephalosporin use.   . Sulfa Antibiotics Swelling and Rash    Current Medications: Current Outpatient Medications  Medication Sig Dispense Refill  . aspirin EC 81 MG tablet Take 1 tablet (81 mg total) by mouth daily. 90 tablet 3  . atorvastatin (LIPITOR) 10 MG tablet TAKE 1 TABLET BY MOUTH  DAILY 90 tablet 0  . OVER THE COUNTER MEDICATION Take 1 capsule by mouth daily. Zendocrine otc supplement    . Probiotic Product (PROBIOTIC PO) Take 1 capsule by mouth daily.     Marland Kitchen spironolactone (ALDACTONE) 25 MG tablet TAKE 1 TABLET BY MOUTH  DAILY 90 tablet 0  . ibuprofen (ADVIL,MOTRIN) 600 MG tablet Take 1 tablet (600 mg  total) by mouth every 6 (six) hours. (Patient not taking: Reported on 03/20/2018) 45 tablet 1  . ONETOUCH VERIO test strip One Touch Verio- pt states she checks her sugar everyday (Patient not taking: Reported on 03/20/2018) 100 each 12  . oxyCODONE (ROXICODONE) 5 MG immediate release tablet Take 1 tablet (5 mg total) by mouth every 4 (four) hours as needed. (Patient not taking: Reported on 03/20/2018) 16 tablet 0   No current facility-administered medications for this visit.     Review of Systems General:  no complaints Skin: no complaints Eyes: no complaints HEENT: no complaints Breasts: no complaints Pulmonary: no complaints Cardiac: no complaints Gastrointestinal: no complaints Genitourinary/Sexual: no complaints Ob/Gyn: no complaints Musculoskeletal: no complaints Hematology: no complaints Neurologic/Psych: no complaints   Objective:  Physical Examination:  BP (!) 149/79 (BP Location: Right Arm, Patient Position: Sitting)   Pulse 99   Temp 98.7 F (37.1 C) (Tympanic)   Resp 18   Ht _0  (1.549 m)   Wt 184 lb (83.5 kg)   LMP  (LMP Unknown)   BMI 34.77 kg/m    ECOG Performance Status: 0 - Asymptomatic  General appearance: alert, cooperative and appears stated age HEENT:PERRLA, neck supple with midline trachea and thyroid without masses Lymph node survey: non-palpable, axillary, inguinal, supraclavicular Cardiovascular: regular rate and rhythm, no murmurs or gallops Respiratory: normal air entry, lungs clear to auscultation and no rales, rhonchi or wheezing Breast exam: not examined. Abdomen: soft, non-tender, without masses or organomegaly, no hernias and well healed incisions Back: inspection of back is normal Extremities: extremities normal, atraumatic, no cyanosis or edema Skin exam - normal coloration and turgor, no rashes, no suspicious skin lesions noted. Neurological exam reveals alert, oriented, normal speech, no focal findings or movement disorder  noted.  Pelvic: EGBUS/Vagina: normal, cuff healing well.  Bimanual: normal.    Assessment:  Seara Hinesley Jennings is a 67 y.o. female diagnosed with non invasive grade 1 endometrial adenocarcinoma s/p TLH/BSO, SLNs 7/19.   Normal post op exam.  Calcified RIGHT upper lobe granuloma on  Pre-op chest radiograph with Chest CT showing calcified RIGHT hilar and mediastinal nodes. Additional nonspecific ground-glass opacities up to 5 mm diameter in RIGHT lung.  Medical co-morbidities complicating care: diet controlled AODM.  Plan:   Problem List Items Addressed This Visit **Note De-Identified Belger Obfuscation** None    Visit Diagnoses    Endometrial cancer, grade I ([place])    -  Primary     In view of favorable clinicopathologic features not adjuvant chemotherapy or radiation is needed. She will RTC for follow up in 6 months with Gyn Onc and 1 yr with Dr Leonides Schanz.  Non-contrast chest CT at 3-6 months is recommended for follow up of lung findings and this will be orderd. If nodules persist and are stable at that time, consider additional non-contrast chest CT examinations at 2 and 4 years.   Beckey Rutter, DNP, AGNP-C [place] [facility] [phone] (work cell) [phone] (office)  I personally interviewed and examined the patient. Agreed with the above/below plan of care. Patient/family questions were answered.  Mellody Drown, MD  CC:  Kathrine Haddock, NP [address], [place] [address] [phone]

## 2018-03-20 NOTE — Patient Instructions (Signed)
**Note De-Identified Ruder Obfuscation** Please call and make appointment to see Dr. Larey Days in 6 months. [phone]. We will see you back in one year.

## 2018-03-20 NOTE — Progress Notes (Signed)
**Note De-identified Kurtenbach Obfuscation** No new Gyn changes noted today 

## 2018-05-23 ENCOUNTER — Other Ambulatory Visit: Payer: Self-pay | Admitting: Unknown Physician Specialty

## 2018-06-03 ENCOUNTER — Ambulatory Visit
Admission: RE | Admit: 2018-06-03 | Discharge: 2018-06-03 | Disposition: A | Discharge status: Home / Self Care | Payer: Medicare Other | Source: Ambulatory Visit | Attending: Obstetrics and Gynecology | Admitting: Obstetrics and Gynecology

## 2018-06-03 ENCOUNTER — Ambulatory Visit: Admission: RE | Admit: 2018-06-03 | Payer: Medicare Other | Source: Ambulatory Visit

## 2018-06-03 ENCOUNTER — Telehealth: Payer: Self-pay

## 2018-06-03 ENCOUNTER — Other Ambulatory Visit: Payer: Medicare Other

## 2018-06-03 DIAGNOSIS — K760 Fatty (change of) liver, not elsewhere classified: Secondary | ICD-10-CM | POA: Insufficient documentation

## 2018-06-03 DIAGNOSIS — R918 Other nonspecific abnormal finding of lung field: Secondary | ICD-10-CM | POA: Diagnosis not present

## 2018-06-03 DIAGNOSIS — I2510 Atherosclerotic heart disease of native coronary artery without angina pectoris: Secondary | ICD-10-CM | POA: Diagnosis not present

## 2018-06-03 DIAGNOSIS — K8020 Calculus of gallbladder without cholecystitis without obstruction: Secondary | ICD-10-CM | POA: Insufficient documentation

## 2018-06-03 DIAGNOSIS — C541 Malignant neoplasm of endometrium: Secondary | ICD-10-CM | POA: Diagnosis present

## 2018-06-03 DIAGNOSIS — I700 Atherosclerosis of aorta: Secondary | ICD-10-CM | POA: Diagnosis not present

## 2018-06-03 NOTE — Telephone Encounter (Signed)
**Note De-Identified Adachi Obfuscation** Called and reviewed CT results/recommdations from scan performed today. We will get Dr.Berchuck to review and repeat scan in 2 years or at his advise.   IMPRESSION: 1. Ground-glass nodules measure 5 mm or less in size. Follow-up CT chest without contrast in 2 years is recommended as low-grade adenocarcinoma cannot be definitively excluded. This recommendation follows the consensus statement: Guidelines for Management of Small Pulmonary Nodules Detected on CT Images: From the Fleischner Society 2017; Radiology 2017; 284:228-243. 2. Aortic atherosclerosis (ICD10-170.0). Coronary artery calcification. 3. Hepatic steatosis. 4. Cholelithiasis.  Oncology Nurse Navigator Documentation  Navigator Location: [facility] (06/03/18 1600)   )Navigator Encounter Type: Telephone (06/03/18 1600) Telephone: Outgoing Call;Diagnostic Results (06/03/18 1600)                                                  Time Spent with Patient: 15 (06/03/18 1600)

## 2018-06-12 ENCOUNTER — Ambulatory Visit: Payer: Medicare Other

## 2018-06-12 ENCOUNTER — Encounter: Payer: Medicare Other | Admitting: Unknown Physician Specialty

## 2018-06-13 ENCOUNTER — Encounter: Payer: Self-pay | Admitting: Nurse Practitioner

## 2018-06-13 ENCOUNTER — Ambulatory Visit (INDEPENDENT_AMBULATORY_CARE_PROVIDER_SITE_OTHER): Payer: Medicare Other | Admitting: Nurse Practitioner

## 2018-06-13 VITALS — BP 128/76 | HR 76 | Temp 98.4°F | Ht 60.6 in | Wt 186.6 lb

## 2018-06-13 DIAGNOSIS — I10 Essential (primary) hypertension: Secondary | ICD-10-CM

## 2018-06-13 DIAGNOSIS — E7800 Pure hypercholesterolemia, unspecified: Secondary | ICD-10-CM | POA: Diagnosis not present

## 2018-06-13 DIAGNOSIS — E119 Type 2 diabetes mellitus without complications: Secondary | ICD-10-CM

## 2018-06-13 DIAGNOSIS — Z0000 Encounter for general adult medical examination without abnormal findings: Secondary | ICD-10-CM

## 2018-06-13 DIAGNOSIS — Z6836 Body mass index (BMI) 36.0-36.9, adult: Secondary | ICD-10-CM

## 2018-06-13 NOTE — Assessment & Plan Note (Addendum)
**Note De-Identified Manny Obfuscation** Stable, continue medications.  CMP today

## 2018-06-13 NOTE — Assessment & Plan Note (Signed)
**Note De-Identified Cawley Obfuscation** Continues to focus on diet changes since hysterectomy is having more difficulty losing weight, but wishes to continue exercise and diet.

## 2018-06-13 NOTE — Patient Instructions (Signed)
**Note De-identified Risko Obfuscation** Diabetes Mellitus and Nutrition When you have diabetes (diabetes mellitus), it is very important to have healthy eating habits because your blood sugar (glucose) levels are greatly affected by what you eat and drink. Eating healthy foods in the appropriate amounts, at about the same times every day, can help you:  Control your blood glucose.  Lower your risk of heart disease.  Improve your blood pressure.  Reach or maintain a healthy weight.  Every person with diabetes is different, and each person has different needs for a meal plan. Your health care provider may recommend that you work with a diet and nutrition specialist (dietitian) to make a meal plan that is best for you. Your meal plan may vary depending on factors such as:  The calories you need.  The medicines you take.  Your weight.  Your blood glucose, blood pressure, and cholesterol levels.  Your activity level.  Other health conditions you have, such as heart or kidney disease.  How do carbohydrates affect me? Carbohydrates affect your blood glucose level more than any other type of food. Eating carbohydrates naturally increases the amount of glucose in your blood. Carbohydrate counting is a method for keeping track of how many carbohydrates you eat. Counting carbohydrates is important to keep your blood glucose at a healthy level, especially if you use insulin or take certain oral diabetes medicines. It is important to know how many carbohydrates you can safely have in each meal. This is different for every person. Your dietitian can help you calculate how many carbohydrates you should have at each meal and for snack. Foods that contain carbohydrates include:  Bread, cereal, rice, pasta, and crackers.  Potatoes and corn.  Peas, beans, and lentils.  Milk and yogurt.  Fruit and juice.  Desserts, such as cakes, cookies, ice cream, and candy.  How does alcohol affect me? Alcohol can cause a sudden decrease in blood  glucose (hypoglycemia), especially if you use insulin or take certain oral diabetes medicines. Hypoglycemia can be a life-threatening condition. Symptoms of hypoglycemia (sleepiness, dizziness, and confusion) are similar to symptoms of having too much alcohol. If your health care provider says that alcohol is safe for you, follow these guidelines:  Limit alcohol intake to no more than 1 drink per day for nonpregnant women and 2 drinks per day for men. One drink equals 12 oz of beer, 5 oz of wine, or 1 oz of hard liquor.  Do not drink on an empty stomach.  Keep yourself hydrated with water, diet soda, or unsweetened iced tea.  Keep in mind that regular soda, juice, and other mixers may contain a lot of sugar and must be counted as carbohydrates.  What are tips for following this plan? Reading food labels  Start by checking the serving size on the label. The amount of calories, carbohydrates, fats, and other nutrients listed on the label are based on one serving of the food. Many foods contain more than one serving per package.  Check the total grams (g) of carbohydrates in one serving. You can calculate the number of servings of carbohydrates in one serving by dividing the total carbohydrates by 15. For example, if a food has 30 g of total carbohydrates, it would be equal to 2 servings of carbohydrates.  Check the number of grams (g) of saturated and trans fats in one serving. Choose foods that have low or no amount of these fats.  Check the number of milligrams (mg) of sodium in one serving. Most people  **Note De-identified Hardebeck Obfuscation** should limit total sodium intake to less than 2,300 mg per day.  Always check the nutrition information of foods labeled as "low-fat" or "nonfat". These foods may be higher in added sugar or refined carbohydrates and should be avoided.  Talk to your dietitian to identify your daily goals for nutrients listed on the label. Shopping  Avoid buying canned, premade, or processed foods. These  foods tend to be high in fat, sodium, and added sugar.  Shop around the outside edge of the grocery store. This includes fresh fruits and vegetables, bulk grains, fresh meats, and fresh dairy. Cooking  Use low-heat cooking methods, such as baking, instead of high-heat cooking methods like deep frying.  Cook using healthy oils, such as olive, canola, or sunflower oil.  Avoid cooking with butter, cream, or high-fat meats. Meal planning  Eat meals and snacks regularly, preferably at the same times every day. Avoid going long periods of time without eating.  Eat foods high in fiber, such as fresh fruits, vegetables, beans, and whole grains. Talk to your dietitian about how many servings of carbohydrates you can eat at each meal.  Eat 4-6 ounces of lean protein each day, such as lean meat, chicken, fish, eggs, or tofu. 1 ounce is equal to 1 ounce of meat, chicken, or fish, 1 egg, or 1/4 cup of tofu.  Eat some foods each day that contain healthy fats, such as avocado, nuts, seeds, and fish. Lifestyle   Check your blood glucose regularly.  Exercise at least 30 minutes 5 or more days each week, or as told by your health care provider.  Take medicines as told by your health care provider.  Do not use any products that contain nicotine or tobacco, such as cigarettes and e-cigarettes. If you need help quitting, ask your health care provider.  Work with a counselor or diabetes educator to identify strategies to manage stress and any emotional and social challenges. What are some questions to ask my health care provider?  Do I need to meet with a diabetes educator?  Do I need to meet with a dietitian?  What number can I call if I have questions?  When are the best times to check my blood glucose? Where to find more information:  American Diabetes Association: diabetes.org/food-and-fitness/food  Academy of Nutrition and Dietetics:  www.eatright.org/resources/health/diseases-and-conditions/diabetes  National Institute of Diabetes and Digestive and Kidney Diseases (NIH): www.niddk.nih.gov/health-information/diabetes/overview/diet-eating-physical-activity Summary  A healthy meal plan will help you control your blood glucose and maintain a healthy lifestyle.  Working with a diet and nutrition specialist (dietitian) can help you make a meal plan that is best for you.  Keep in mind that carbohydrates and alcohol have immediate effects on your blood glucose levels. It is important to count carbohydrates and to use alcohol carefully. This information is not intended to replace advice given to you by your health care provider. Make sure you discuss any questions you have with your health care provider. Document Released: 04/27/2005 Document Revised: 09/04/2016 Document Reviewed: 09/04/2016 Elsevier Interactive Patient Education  2018 Elsevier Inc.  

## 2018-06-13 NOTE — Assessment & Plan Note (Addendum)
**Note De-identified Ermis Obfuscation** Stable, continue current medications.  Lipid panel today. 

## 2018-06-13 NOTE — Progress Notes (Signed)
**Note De-Identified Coldwell Obfuscation** BP 128/76 (BP Location: Left Arm, Patient Position: Sitting)   Pulse 76   Temp 98.4 F (36.9 C) (Oral)   Ht 5' 0.6" (1.539 m)   Wt 186 lb 9.6 oz (84.6 kg)   LMP  (LMP Unknown)   SpO2 96%   BMI 35.72 kg/m    Subjective:    Patient ID: Suzanne Jennings, female    DOB: 1951-06-05, 67 y.o.   MRN: [id number]  HPI: Suzanne Jennings is a 67 y.o. female presents for annual physical  Chief Complaint  Patient presents with  . Annual Exam    pt states she would like mammogram ordered  . Diabetes   DIABETES Currently diet-controlled and very focused on diet + taking cassia/coriander/lavender/frankincense mix daily (25 drops of each in a bottle). Hypoglycemic episodes:no Polydipsia/polyuria: no Visual disturbance: no Chest pain: no Paresthesias: no Glucose Monitoring: yes  Accucheck frequency: Daily  Fasting glucose: 99-103  Post prandial:  Evening:  Before meals: Taking Insulin?: no  Long acting insulin:  Short acting insulin: Blood Pressure Monitoring: daily Retinal Examination: Up to Date Foot Exam: Up to Date Diabetic Education: Completed Pneumovax: Not up to Date, does not take Influenza: Not up to Date, does not take Aspirin: yes  HYPERTENSION / HYPERLIPIDEMIA Takes medication daily without issue. Satisfied with current treatment? yes Duration of hypertension: chronic BP monitoring frequency: daily BP range: 129/65 to 133/70 BP medication side effects: no Past BP meds: does not recall Duration of hyperlipidemia: chronic Cholesterol medication side effects: no Cholesterol supplements: none Past cholesterol medications:none Medication compliance: excellent compliance Aspirin: yes Recent stressors: no Recurrent headaches: no Visual changes: no Palpitations: no Dyspnea: no Chest pain: no Lower extremity edema: no Dizzy/lightheaded: no  Relevant past medical, surgical, family and social history reviewed and updated as indicated. Interim medical history since our  last visit reviewed. Allergies and medications reviewed and updated.  Review of Systems  Constitutional: Negative for activity change, appetite change, fatigue, fever and unexpected weight change.  HENT: Negative for ear pain, hearing loss, postnasal drip, rhinorrhea, trouble swallowing and voice change.   Eyes: Negative.   Respiratory: Negative for cough, chest tightness and shortness of breath.   Cardiovascular: Negative for chest pain, palpitations and leg swelling.  Gastrointestinal: Negative for abdominal distention, abdominal pain, constipation, diarrhea, nausea and vomiting.  Endocrine: Negative for cold intolerance, heat intolerance, polydipsia, polyphagia and polyuria.  Genitourinary: Negative.   Musculoskeletal: Negative.   Skin: Negative.   Allergic/Immunologic: Negative.   Neurological: Negative for dizziness, numbness and headaches.  Hematological: Negative.   Psychiatric/Behavioral: Negative.     Per HPI unless specifically indicated above     Objective:    BP 128/76 (BP Location: Left Arm, Patient Position: Sitting)   Pulse 76   Temp 98.4 F (36.9 C) (Oral)   Ht 5' 0.6" (1.539 m)   Wt 186 lb 9.6 oz (84.6 kg)   LMP  (LMP Unknown)   SpO2 96%   BMI 35.72 kg/m   Wt Readings from Last 3 Encounters:  06/13/18 186 lb 9.6 oz (84.6 kg)  03/20/18 184 lb (83.5 kg)  02/20/18 188 lb (85.3 kg)    Physical Exam  Constitutional: She is oriented to person, place, and time. She appears well-developed and well-nourished.  HENT:  Head: Normocephalic and atraumatic.  Right Ear: Hearing, tympanic membrane, external ear and ear canal normal.  Left Ear: Hearing, tympanic membrane, external ear and ear canal normal.  Nose: Nose normal. Right sinus exhibits no maxillary sinus **Note De-Identified Skop Obfuscation** tenderness and no frontal sinus tenderness. Left sinus exhibits no maxillary sinus tenderness and no frontal sinus tenderness.  Mouth/Throat: Oropharynx is clear and moist.  Eyes: Pupils are equal, round, and  reactive to light. Conjunctivae and EOM are normal. Right eye exhibits no discharge. Left eye exhibits no discharge.  Neck: Normal range of motion. Neck supple. No JVD present. Carotid bruit is not present. No thyromegaly present.  Cardiovascular: Normal rate, regular rhythm, normal heart sounds and intact distal pulses.  Pulmonary/Chest: Effort normal and breath sounds normal. Right breast exhibits no inverted nipple, no mass, no nipple discharge, no skin change and no tenderness. Left breast exhibits no inverted nipple, no mass, no nipple discharge, no skin change and no tenderness.  Abdominal: Soft. Bowel sounds are normal. There is no splenomegaly or hepatomegaly.  Musculoskeletal: Normal range of motion.  Lymphadenopathy:    She has no cervical adenopathy.  Neurological: She is alert and oriented to person, place, and time. She has normal reflexes.  Reflex Scores:      Brachioradialis reflexes are 2+ on the right side and 2+ on the left side.      Patellar reflexes are 2+ on the right side and 2+ on the left side. Skin: Skin is warm and dry.  Psychiatric: She has a normal mood and affect. Her behavior is normal.     RECENT LABS REVIEWED AND NOTED [place] CHART REVIEW.  Assessment & Plan:   Problem List Items Addressed This Visit      Cardiovascular and Mediastinum   Hypertension    Stable, continue medications.  CMP today      Relevant Orders   Comprehensive metabolic panel     Endocrine   Type 2 diabetes mellitus without complications (HCC)    Chronic, stable.  Diet controlled.  A1C and urine micro today.      Relevant Orders   CBC with Differential/Platelet   Comprehensive metabolic panel   Urine Microalbumin w/creat. ratio   HgB A1c     Other   Obesity    Continues to focus on diet changes since hysterectomy is having more difficulty losing weight, but wishes to continue exercise and diet.      Hypercholesteremia    Stable, continue current medications.  Lipid panel  today.      Relevant Orders   Lipid Profile    Other Visit Diagnoses    Annual physical exam    -  Primary   Relevant Orders   MM DIGITAL SCREENING BILATERAL   TSH       Follow up plan: Return in about 6 months (around 12/12/2018) for T2DM and HTN.

## 2018-06-13 NOTE — Assessment & Plan Note (Addendum)
**Note De-Identified Runyan Obfuscation** Chronic, stable.  Diet controlled.  A1C and urine micro today.

## 2018-06-14 ENCOUNTER — Other Ambulatory Visit: Payer: Self-pay

## 2018-06-14 ENCOUNTER — Telehealth: Payer: Self-pay | Admitting: Nurse Practitioner

## 2018-06-14 ENCOUNTER — Telehealth: Payer: Self-pay

## 2018-06-14 DIAGNOSIS — R918 Other nonspecific abnormal finding of lung field: Secondary | ICD-10-CM

## 2018-06-14 DIAGNOSIS — C541 Malignant neoplasm of endometrium: Secondary | ICD-10-CM

## 2018-06-14 LAB — CBC WITH DIFFERENTIAL/PLATELET
BASOS ABS: 0.1 10*3/uL (ref 0.0–0.2)
BASOS: 1 %
EOS (ABSOLUTE): 0.1 10*3/uL (ref 0.0–0.4)
Eos: 1 %
HEMOGLOBIN: 14.7 g/dL (ref 11.1–15.9)
Hematocrit: 43.5 % (ref 34.0–46.6)
IMMATURE GRANS (ABS): 0 10*3/uL (ref 0.0–0.1)
IMMATURE GRANULOCYTES: 0 %
LYMPHS: 34 %
Lymphocytes Absolute: 2.5 10*3/uL (ref 0.7–3.1)
MCH: 31.6 pg (ref 26.6–33.0)
MCHC: 33.8 g/dL (ref 31.5–35.7)
MCV: 94 fL (ref 79–97)
MONOCYTES: 8 %
Monocytes Absolute: 0.6 10*3/uL (ref 0.1–0.9)
NEUTROS ABS: 4.3 10*3/uL (ref 1.4–7.0)
NEUTROS PCT: 56 %
Platelets: 292 10*3/uL (ref 150–450)
RBC: 4.65 x10E6/uL (ref 3.77–5.28)
RDW: 12.1 % — ABNORMAL LOW (ref 12.3–15.4)
WBC: 7.5 10*3/uL (ref 3.4–10.8)

## 2018-06-14 LAB — LIPID PANEL
CHOL/HDL RATIO: 5.5 ratio — AB (ref 0.0–4.4)
Cholesterol, Total: 205 mg/dL — ABNORMAL HIGH (ref 100–199)
HDL: 37 mg/dL — ABNORMAL LOW (ref >39)
LDL CALC: 111 mg/dL — AB (ref 0–99)
Triglycerides: 284 mg/dL — ABNORMAL HIGH (ref 0–149)
VLDL Cholesterol Cal: 57 mg/dL — ABNORMAL HIGH (ref 5–40)

## 2018-06-14 LAB — COMPREHENSIVE METABOLIC PANEL
ALBUMIN: 4.4 g/dL (ref 3.6–4.8)
ALT: 48 IU/L — AB (ref 0–32)
AST: 51 IU/L — ABNORMAL HIGH (ref 0–40)
Albumin/Globulin Ratio: 1.6 (ref 1.2–2.2)
Alkaline Phosphatase: 84 IU/L (ref 39–117)
BILIRUBIN TOTAL: 0.6 mg/dL (ref 0.0–1.2)
BUN / CREAT RATIO: 11 — AB (ref 12–28)
BUN: 10 mg/dL (ref 8–27)
CALCIUM: 9.8 mg/dL (ref 8.7–10.3)
CO2: 19 mmol/L — ABNORMAL LOW (ref 20–29)
Chloride: 103 mmol/L (ref 96–106)
Creatinine, Ser: 0.87 mg/dL (ref 0.57–1.00)
GFR, EST AFRICAN AMERICAN: 79 mL/min/{1.73_m2} (ref >59)
GFR, EST NON AFRICAN AMERICAN: 68 mL/min/{1.73_m2} (ref >59)
GLUCOSE: 155 mg/dL — AB (ref 65–99)
Globulin, Total: 2.8 g/dL (ref 1.5–4.5)
POTASSIUM: 4.4 mmol/L (ref 3.5–5.2)
SODIUM: 142 mmol/L (ref 134–144)
Total Protein: 7.2 g/dL (ref 6.0–8.5)

## 2018-06-14 LAB — HEMOGLOBIN A1C
Est. average glucose Bld gHb Est-mCnc: 171 mg/dL
Hgb A1c MFr Bld: 7.6 % — ABNORMAL HIGH (ref 4.8–5.6)

## 2018-06-14 LAB — TSH: TSH: 2.85 u[IU]/mL (ref 0.450–4.500)

## 2018-06-14 LAB — MICROALBUMIN / CREATININE URINE RATIO
CREATININE, UR: 290.6 mg/dL
MICROALB/CREAT RATIO: 8.9 mg/g{creat} (ref 0.0–30.0)
Microalbumin, Urine: 26 ug/mL

## 2018-06-14 NOTE — Telephone Encounter (Signed)
**Note De-Identified Skare Obfuscation** Chest Ct from 10/21 discussed with Dr. Fransisca Connors. He is agreeable to two year follow up scan. Voicemail left with Ms. Sutch to return call for notification. Orders placed. Oncology Nurse Navigator Documentation  Navigator Location: [facility] (06/14/18 1000)   )Navigator Encounter Type: Telephone;Diagnostic Results (06/14/18 1000) Telephone: Outgoing Call (06/14/18 1000)                                                  Time Spent with Patient: 15 (06/14/18 1000)

## 2018-06-14 NOTE — Telephone Encounter (Signed)
**Note De-Identified Koci Obfuscation** General message left with patient to go over lab results from 06/13/18.  Patient continues to wish to use natural homeopathy vs medications for T2DM and hyperlipidemia.  Labs noting elevation in A1C from previous and lipid panel.  Will discuss these findings with her upon return call.

## 2018-06-14 NOTE — Progress Notes (Signed)
**Note De-Identified Sarabia Obfuscation** Spoke with Suzanne Jennings. Update that she will be due for repeat chest CT in October 2021. Orders have been placed.

## 2018-08-29 ENCOUNTER — Other Ambulatory Visit: Payer: Self-pay | Admitting: Unknown Physician Specialty

## 2018-08-29 NOTE — Telephone Encounter (Signed)
**Note De-Identified Cid Obfuscation** Requested Prescriptions  Pending Prescriptions Disp Refills  . spironolactone (ALDACTONE) 25 MG tablet [Pharmacy Med Name: SPIRONOLACTONE TAB 25MG ] 90 tablet 0    Sig: TAKE 1 TABLET BY MOUTH  DAILY     Cardiovascular: Diuretics - Aldosterone Antagonist Passed - 08/29/2018  5:29 AM      Passed - Cr in normal range and within 360 days    Creatinine, Ser  Date Value Ref Range Status  06/13/2018 0.87 0.57 - 1.00 mg/dL Final         Passed - K in normal range and within 360 days    Potassium  Date Value Ref Range Status  06/13/2018 4.4 3.5 - 5.2 mmol/L Final         Passed - Na in normal range and within 360 days    Sodium  Date Value Ref Range Status  06/13/2018 142 134 - 144 mmol/L Final         Passed - Last BP in normal range    BP Readings from Last 1 Encounters:  06/13/18 128/76         Passed - Valid encounter within last 6 months    Recent Outpatient Visits          2 months ago Annual physical exam   [facility] Marnee Guarneri T, NP   6 months ago Abnormal EKG   [facility], [place]   8 months ago Essential hypertension   [place] Kathrine Haddock, NP   1 year ago Essential hypertension   [place] Kathrine Haddock, NP   1 year ago Annual physical exam   [facility] Kathrine Haddock, NP      Future Appointments            In 3 months Cannady, Barbaraann Faster, NP [employer]

## 2018-09-18 ENCOUNTER — Inpatient Hospital Stay: Payer: Medicare Other

## 2018-10-02 ENCOUNTER — Other Ambulatory Visit: Payer: Self-pay

## 2018-10-02 ENCOUNTER — Encounter (INDEPENDENT_AMBULATORY_CARE_PROVIDER_SITE_OTHER): Payer: Self-pay

## 2018-10-02 ENCOUNTER — Inpatient Hospital Stay: Payer: Medicare Other | Attending: Obstetrics and Gynecology | Admitting: Obstetrics and Gynecology

## 2018-10-02 VITALS — BP 147/81 | HR 80 | Temp 97.5°F | Resp 18 | Wt 185.3 lb

## 2018-10-02 DIAGNOSIS — R591 Generalized enlarged lymph nodes: Secondary | ICD-10-CM

## 2018-10-02 DIAGNOSIS — E785 Hyperlipidemia, unspecified: Secondary | ICD-10-CM | POA: Diagnosis not present

## 2018-10-02 DIAGNOSIS — R918 Other nonspecific abnormal finding of lung field: Secondary | ICD-10-CM | POA: Diagnosis not present

## 2018-10-02 DIAGNOSIS — Z90722 Acquired absence of ovaries, bilateral: Secondary | ICD-10-CM | POA: Diagnosis not present

## 2018-10-02 DIAGNOSIS — E119 Type 2 diabetes mellitus without complications: Secondary | ICD-10-CM | POA: Insufficient documentation

## 2018-10-02 DIAGNOSIS — Z8542 Personal history of malignant neoplasm of other parts of uterus: Secondary | ICD-10-CM | POA: Insufficient documentation

## 2018-10-02 DIAGNOSIS — C541 Malignant neoplasm of endometrium: Secondary | ICD-10-CM | POA: Diagnosis present

## 2018-10-02 DIAGNOSIS — E7800 Pure hypercholesterolemia, unspecified: Secondary | ICD-10-CM | POA: Diagnosis not present

## 2018-10-02 DIAGNOSIS — Z90710 Acquired absence of both cervix and uterus: Secondary | ICD-10-CM | POA: Diagnosis not present

## 2018-10-02 DIAGNOSIS — M1990 Unspecified osteoarthritis, unspecified site: Secondary | ICD-10-CM | POA: Insufficient documentation

## 2018-10-02 DIAGNOSIS — I10 Essential (primary) hypertension: Secondary | ICD-10-CM | POA: Diagnosis not present

## 2018-10-02 NOTE — Progress Notes (Signed)
**Note De-Identified Molner Obfuscation** Here for follow up. Per pt denies pain,denies vaginal d/c , or bleeding " feeling good " she stated

## 2018-10-02 NOTE — Progress Notes (Signed)
**Note De-Identified Aguillard Obfuscation** Gynecologic Oncology Interval Visit   Referring Provider: Dr. Amalia Hailey  Chief Concern: Grade 1 endometrial cancer  Subjective:  Suzanne Jennings is a 68 y.o. P2 female , initially seen in consultation for Dr. Amalia Hailey for FIGO IA grade 1 endometrioid cancer, returns to clinic today for follow-up.   She was last seen in clinic by Dr. Fransisca Connors on 03/20/2018 for postop evaluation and discussion of pathology results.  In the interim, she had chest CT to follow-up on calcified right hilar and mediastinal nodes and nonspecific groundglass opacities.  Imaging on 06/03/2018 reported as stable and unchanged.  Recommended follow-up CT in 2 years (05/2020).   Today she reports feeling well and denies specific complaints.  Denies vaginal bleeding or pain.  Denies early satiety.  Denies changes in bowel or bladder habits.   Gynecologic Oncology History:  Patient initially presented with report of 3 weeks of vaginal bleeding. Endometrial biopsy 01/17/18 by Dr Amalia Hailey showed grade 1 endometrial cancer with focal polyp.   CXR 02/15/18:  IMPRESSION: 6 mm nodular opacity right perihilar region. Advise noncontrast enhanced chest CT to further assess. Lungs elsewhere clear.  Chest CT 7/19 Calcified RIGHT upper lobe granuloma accounting for chest radiograph finding with associated calcified RIGHT hilar and mediastinal nodes.  Additional nonspecific ground-glass opacities up to 5 mm diameter in RIGHT lung, recommendation below.  Non-contrast chest CT at 3-6 months is recommended. If nodules persist and are stable at that time, consider additional non-contrast chest CT examinations at 2 and 4 years. This recommendation follows the consensus statement: Guidelines for Management of Incidental Pulmonary Nodules Detected on CT Images: From the Fleischner Society 2017; Radiology 2017; 284:228-243.  On 02/20/18, she underwent TLH, BSO with pelvic SLN mapping and biopsies, minilaparotomy with Dr. Fransisca Connors and Dr. Leonides Schanz at [facility].  Uterus was enlarged to about 12 week size d/t size of myomas.   DIAGNOSIS:  A. UTERUS WITH CERVIX; HYSTERECTOMY:  - ENDOMETRIOID CARCINOMA, FIGO 1.  - NEGATIVE FOR MYOMETRIAL INVASION, CERVICAL INVOLVEMENT, AND SEROSAL INVOLVEMENT.  - MULTIPLE SUBSEROSAL, INTRAMURAL, AND SUBMUCOSAL LEIOMYOMAS (404 GRAM UTERUS).   BILATERAL FALLOPIAN TUBES AND OVARIES; SALPINGO-OOPHORECTOMY:  - NEGATIVE FOR MALIGNANCY.  - ATROPHY.   B. SENTINEL LYMPH NODE, RIGHT PELVIC; EXCISION:  - NEGATIVE FOR MALIGNANCY, ONE LYMPH NODE (0/1).   C. SENTINEL LYMPH NODE, LEFT PELVIC; EXCISION:  - NEGATIVE FOR MALIGNANCY, ONE LYMPH NODE (0/1).  Comment:  The bulk of the endometrial carcinoma involves an endometrial polyp. The background endometrium exhibits atypical hyperplasia/EIN and focal carcinoma.  A. PELVIC WASHINGS:  - NEGATIVE FOR MALIGNANCY.  - MESOTHELIAL CELLS AND MACROPHAGES.  Immunohistochemistry (IHC) Testing for DNA Mismatch Repair (MMR)  Proteins:  Results:  MLH1: Intact nuclear expression  MSH2: Intact nuclear expression  MSH6: Intact nuclear expression  PMS2: Intact nuclear expression  IHC Interpretation: No loss of nuclear expression of MMR proteins: Low probability of MSI-H.   Problem List: Patient Active Problem List   Diagnosis Date Noted  . Endometrial cancer ([place]) 10/02/2018  . Post-menopausal bleeding 12/10/2017  . Type 2 diabetes mellitus without complications ([place]) 33/82/5053  . Advanced care planning/counseling discussion 03/12/2017  . Benign neoplasm of descending colon   . Polyp of sigmoid colon   . Benign neoplasm of cecum   . Metabolic syndrome 97/67/3419  . 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 Hypercholesteremia 09/21/2015  . Hypertension 08/30/2015  . Osteoarthritis (arthritis due to wear and tear of joints) 08/30/2015  . Obesity 08/30/2015    Past Medical History: Past Medical History: **Note De-Identified Lyday Obfuscation** Diagnosis Date  . Diabetes mellitus without complication ([place])   .  Endometrial cancer ([place])   . History of kidney stones   . Hyperlipidemia   . Hypertension   . Kidney stone   . Knee pain     Past Surgical History: Past Surgical History:  Procedure Laterality Date  . APPENDECTOMY    . COLONOSCOPY WITH PROPOFOL N/A 07/03/2016   Procedure: COLONOSCOPY WITH PROPOFOL;  Surgeon: Lucilla Lame, MD;  Location: [place];  Service: Endoscopy;  Laterality: N/A;  . LAPAROSCOPIC HYSTERECTOMY N/A 02/20/2018   Procedure: HYSTERECTOMY TOTAL LAPAROSCOPIC, WITH BILATERAL SALPINGOOPHERECTOMY;  Surgeon: Mellody Drown, MD;  Location: [facility];  Service: Gynecology;  Laterality: N/A;  . POLYPECTOMY  07/03/2016   Procedure: POLYPECTOMY;  Surgeon: Lucilla Lame, MD;  Location: [place];  Service: Endoscopy;;  . SENTINEL NODE BIOPSY N/A 02/20/2018   Procedure: SENTINEL NODE BIOPSY AND MAPPING;  Surgeon: Mellody Drown, MD;  Location: [facility];  Service: Gynecology;  Laterality: N/A;  . TUBAL LIGATION      OB History:  OB History  Gravida Para Term Preterm AB Living  '2 2 1 1   2  ' SAB TAB Ectopic Multiple Live Births          2    # Outcome Date GA Lbr Len/2nd Weight Sex Delivery Anes PTL Lv  2 Term 1979    M Vag-Spont   LIV  1 Preterm 1974    F Vag-Spont   LIV    Family History: Family History  Problem Relation Age of Onset  . Emphysema Mother   . Heart disease Father        massive MI  . Hypertension Sister   . Mental illness Brother   . Hypertension Sister   . Hypertension Sister   . Diabetes Maternal Grandmother   . Breast cancer Neg Hx     Social History: Social History   Socioeconomic History  . Marital status: Married    Spouse name: Not on file  . Number of children: Not on file  . Years of education: Not on file  . Highest education level: Not on file  Occupational History  . Not on file  Social Needs  . Financial resource strain: Not on file  . Food insecurity:    Worry: Not on file    Inability: Not on file  .  Transportation needs:    Medical: Not on file    Non-medical: Not on file  Tobacco Use  . Smoking status: Never Smoker  . Smokeless tobacco: Never Used  Substance and Sexual Activity  . Alcohol use: No    Alcohol/week: 0.0 standard drinks  . Drug use: No  . Sexual activity: Yes    Birth control/protection: Post-menopausal  Lifestyle  . Physical activity:    Days per week: Not on file    Minutes per session: Not on file  . Stress: Not on file  Relationships  . Social connections:    Talks on phone: Not on file    Gets together: Not on file    Attends religious service: Not on file    Active member of club or organization: Not on file    Attends meetings of clubs or organizations: Not on file    Relationship status: Not on file  . Intimate partner violence:    Fear of current or ex partner: Not on file    Emotionally abused: Not on file    Physically abused: Not on **Note De-Identified Ewell Obfuscation** file    Forced sexual activity: Not on file  Other Topics Concern  . Not on file  Social History Narrative  . Not on file    Allergies: Allergies  Allergen Reactions  . Aspirin Other (See Comments)    "blood thinner, makes nose bleed - but pt can take 81 mg dose without problems  . Other Other (See Comments)    Yogurt causes yeast infections Nuts except peanuts and almonds cause anaphylaxis   . Penicillins Swelling and Rash    Has patient had a PCN reaction causing immediate rash, facial/tongue/throat swelling, SOB or lightheadedness with hypotension: Yes Has patient had a PCN reaction causing severe rash involving mucus membranes or skin necrosis: No Has patient had a PCN reaction that required hospitalization: No Has patient had a PCN reaction occurring within the last 10 years: No If all of the above answers are "NO", then may proceed with Cephalosporin use.   . Sulfa Antibiotics Swelling and Rash    Current Medications: Current Outpatient Medications  Medication Sig Dispense Refill  . aspirin EC  81 MG tablet Take 1 tablet (81 mg total) by mouth daily. 90 tablet 3  . atorvastatin (LIPITOR) 10 MG tablet TAKE 1 TABLET BY MOUTH  DAILY 90 tablet 1  . OVER THE COUNTER MEDICATION Take 1 capsule by mouth daily. Zendocrine otc supplement    . Probiotic Product (PROBIOTIC PO) Take 1 capsule by mouth daily.     Marland Kitchen spironolactone (ALDACTONE) 25 MG tablet TAKE 1 TABLET BY MOUTH  DAILY 90 tablet 0  . ONETOUCH VERIO test strip One Touch Verio- pt states she checks her sugar everyday (Patient not taking: Reported on 10/02/2018) 100 each 12   No current facility-administered medications for this visit.     Review of Systems General:  no complaints Skin: no complaints Eyes: no complaints HEENT: no complaints Breasts: no complaints Pulmonary: no complaints Cardiac: no complaints Gastrointestinal: no complaints Genitourinary/Sexual: no complaints Ob/Gyn: no complaints Musculoskeletal: no complaints Hematology: no complaints Neurologic/Psych: no complaints   Objective:  Physical Examination:  BP (!) 147/81 (BP Location: Left Arm, Patient Position: Sitting)   Pulse 80   Temp (!) 97.5 F (36.4 C) (Tympanic)   Resp 18   Wt 185 lb 4.8 oz (84.1 kg)   LMP  (LMP Unknown)   BMI 35.48 kg/m    ECOG Performance Status: 0 - Asymptomatic  GENERAL: Patient is a well appearing female in no acute distress HEENT:  Sclera clear. Anicteric NODES:  Negative axillary, supraclavicular, inguinal lymph node survery LUNGS:  Clear to auscultation bilaterally.   HEART:  Regular rate and rhythm.  ABDOMEN:  Soft, nontender.  No hernias, incisions well healed. No masses or ascites EXTREMITIES:  No peripheral edema. Atraumatic. No cyanosis SKIN:  Clear with no obvious rashes or skin changes.  NEURO:  Nonfocal. Well oriented.  Appropriate affect.  Pelvic: EGBUS/Vagina: normal, no lesions seen.  Bimanual: normal.    Assessment:  Suzanne Jennings is a 68 y.o. female diagnosed with non invasive grade 1 endometrial  adenocarcinoma s/p TLH/BSO, SLNs 7/19.   No evidence of disease today.  Calcified RIGHT upper lobe granuloma on  Pre-op chest radiograph with Chest CT showing calcified RIGHT hilar and mediastinal nodes. Additional nonspecific ground-glass opacities up to 5 mm diameter in RIGHT lung.  Follow-up CT is stable and unchanged.  Medical co-morbidities complicating care: diet controlled AODM.  Plan:   Problem List Items Addressed This Visit      Genitourinary **Note De-Identified Selden Obfuscation** Endometrial cancer [facility]) - Primary     Discussed NCCN surveillance recommendations.  We will plan to have her return to clinic in 6 months for reevaluation and at that time we will consider alternating visits with Dr. Amalia Hailey.  Noncontrast chest CT ordered and scheduled for 05/2020 in view of lung findings.  Suspect benign etiology.   Beckey Rutter, DNP, AGNP-C [place] [facility] [phone] (work cell) [phone] (office)  I personally interviewed and examined the patient. Agreed with the above/below plan of care. Patient/family questions were answered.  Mellody Drown, MD  CC:  Marnee Guarneri Dr. Amalia Hailey

## 2018-10-08 ENCOUNTER — Telehealth: Payer: Self-pay | Admitting: Nurse Practitioner

## 2018-10-08 NOTE — Telephone Encounter (Signed)
**Note De-Identified Bonello Obfuscation** Called to schedule Medicare Annual Wellness Visit with the Nurse Health Advisor. Patient DECLINED Medicare Annual Wellness Visit.  Janace Hoard, Care Guide.

## 2018-11-28 ENCOUNTER — Other Ambulatory Visit: Payer: Self-pay | Admitting: Nurse Practitioner

## 2018-11-28 NOTE — Telephone Encounter (Signed)
**Note De-Identified Hipple Obfuscation** Requested Prescriptions  Pending Prescriptions Disp Refills  . spironolactone (ALDACTONE) 25 MG tablet [Pharmacy Med Name: SPIRONOLACTONE 25MG  TABLET] 90 tablet 0    Sig: TAKE 1 TABLET BY MOUTH  DAILY     Cardiovascular: Diuretics - Aldosterone Antagonist Failed - 11/28/2018 10:51 AM      Failed - Last BP in normal range    BP Readings from Last 1 Encounters:  10/02/18 (!) 147/81         Passed - Cr in normal range and within 360 days    Creatinine, Ser  Date Value Ref Range Status  06/13/2018 0.87 0.57 - 1.00 mg/dL Final         Passed - K in normal range and within 360 days    Potassium  Date Value Ref Range Status  06/13/2018 4.4 3.5 - 5.2 mmol/L Final         Passed - Na in normal range and within 360 days    Sodium  Date Value Ref Range Status  06/13/2018 142 134 - 144 mmol/L Final         Passed - Valid encounter within last 6 months    Recent Outpatient Visits          5 months ago Annual physical exam   [facility] Marnee Guarneri T, NP   9 months ago Abnormal EKG   [facility], [place]   11 months ago Essential hypertension   [facility] Kathrine Haddock, NP   1 year ago Essential hypertension   [facility] Kathrine Haddock, NP   1 year ago Annual physical exam   [facility] Kathrine Haddock, NP      Future Appointments            In 2 weeks Cannady, Barbaraann Faster, NP [employer]

## 2018-11-29 ENCOUNTER — Telehealth: Payer: Self-pay

## 2018-11-29 MED ORDER — ATORVASTATIN CALCIUM 10 MG PO TABS: 10.0000 mg | ORAL_TABLET | Freq: Every day | ORAL | 1 refills | Status: DC

## 2018-11-29 NOTE — Telephone Encounter (Signed)
**Note De-Identified Janusz Obfuscation** Fax from [facility] requesting RX atorvastatin tab refill.

## 2018-12-02 ENCOUNTER — Other Ambulatory Visit: Payer: Self-pay

## 2018-12-02 MED ORDER — ATORVASTATIN CALCIUM 10 MG PO TABS: 10.0000 mg | ORAL_TABLET | Freq: Every day | ORAL | 1 refills | Status: DC

## 2018-12-03 NOTE — Telephone Encounter (Signed)
**Note De-Identified Vipond Obfuscation** Pt also needs spironolactone [facility]

## 2018-12-03 NOTE — Telephone Encounter (Signed)
**Note De-Identified Gatling Obfuscation** Sorry I see that spironolactone was sent on 11-28-18. I will call the patient back and ask her to contact again

## 2018-12-10 ENCOUNTER — Telehealth: Payer: Self-pay | Admitting: Nurse Practitioner

## 2018-12-10 NOTE — Telephone Encounter (Signed)
**Note De-identified Bornemann Obfuscation** appt rescheduled.

## 2018-12-10 NOTE — Telephone Encounter (Signed)
**Note De-Identified Gorr Obfuscation** Pt returning call to reschedule appt; called on Covid line. Attempted to transfer to practice to reschedule, call dropped. Unable to reach pt. CB# [phone]

## 2018-12-13 ENCOUNTER — Ambulatory Visit: Payer: Medicare Other | Admitting: Nurse Practitioner

## 2019-02-04 ENCOUNTER — Ambulatory Visit: Payer: Medicare Other | Admitting: Nurse Practitioner

## 2019-02-09 ENCOUNTER — Other Ambulatory Visit: Payer: Self-pay | Admitting: Nurse Practitioner

## 2019-02-09 NOTE — Telephone Encounter (Signed)
**Note De-Identified Dibenedetto Obfuscation** Requested Prescriptions  Pending Prescriptions Disp Refills  . spironolactone (ALDACTONE) 25 MG tablet [Pharmacy Med Name: SPIRONOLACTONE 25MG  TABLET] 30 tablet 0    Sig: TAKE 1 TABLET BY MOUTH  DAILY     Cardiovascular: Diuretics - Aldosterone Antagonist Failed - 02/09/2019  6:22 AM      Failed - Last BP in normal range    BP Readings from Last 1 Encounters:  10/02/18 (!) 147/81         Failed - Valid encounter within last 6 months    Recent Outpatient Visits          8 months ago Annual physical exam   [facility] Marnee Guarneri T, NP   11 months ago Abnormal EKG   [facility], [place], [place]   1 year ago Essential hypertension   [place] Kathrine Haddock, NP   1 year ago Essential hypertension   [place], NP   1 year ago Annual physical exam   [facility] Kathrine Haddock, NP             Passed - Cr in normal range and within 360 days    Creatinine, Ser  Date Value Ref Range Status  06/13/2018 0.87 0.57 - 1.00 mg/dL Final         Passed - K in normal range and within 360 days    Potassium  Date Value Ref Range Status  06/13/2018 4.4 3.5 - 5.2 mmol/L Final         Passed - Na in normal range and within 360 days    Sodium  Date Value Ref Range Status  06/13/2018 142 134 - 144 mmol/L Final

## 2019-03-03 ENCOUNTER — Telehealth: Payer: Self-pay

## 2019-03-03 NOTE — Telephone Encounter (Signed)
**Note De-Identified Wycoff Obfuscation** Called patient to schedule OV for further refills.

## 2019-03-25 ENCOUNTER — Other Ambulatory Visit: Payer: Self-pay

## 2019-03-26 ENCOUNTER — Inpatient Hospital Stay: Payer: Medicare Other | Attending: Obstetrics and Gynecology | Admitting: Obstetrics and Gynecology

## 2019-03-26 ENCOUNTER — Ambulatory Visit: Payer: Medicare Other

## 2019-03-26 ENCOUNTER — Other Ambulatory Visit: Payer: Self-pay

## 2019-03-26 VITALS — BP 150/80 | HR 102 | Temp 98.5°F | Resp 18 | Wt 183.0 lb

## 2019-03-26 DIAGNOSIS — M1990 Unspecified osteoarthritis, unspecified site: Secondary | ICD-10-CM | POA: Diagnosis not present

## 2019-03-26 DIAGNOSIS — Z90722 Acquired absence of ovaries, bilateral: Secondary | ICD-10-CM | POA: Insufficient documentation

## 2019-03-26 DIAGNOSIS — E669 Obesity, unspecified: Secondary | ICD-10-CM | POA: Diagnosis not present

## 2019-03-26 DIAGNOSIS — Z79899 Other long term (current) drug therapy: Secondary | ICD-10-CM | POA: Diagnosis not present

## 2019-03-26 DIAGNOSIS — E785 Hyperlipidemia, unspecified: Secondary | ICD-10-CM | POA: Insufficient documentation

## 2019-03-26 DIAGNOSIS — Z8542 Personal history of malignant neoplasm of other parts of uterus: Secondary | ICD-10-CM | POA: Diagnosis not present

## 2019-03-26 DIAGNOSIS — I10 Essential (primary) hypertension: Secondary | ICD-10-CM | POA: Diagnosis not present

## 2019-03-26 DIAGNOSIS — Z7982 Long term (current) use of aspirin: Secondary | ICD-10-CM | POA: Diagnosis not present

## 2019-03-26 DIAGNOSIS — E119 Type 2 diabetes mellitus without complications: Secondary | ICD-10-CM | POA: Diagnosis not present

## 2019-03-26 DIAGNOSIS — Z08 Encounter for follow-up examination after completed treatment for malignant neoplasm: Secondary | ICD-10-CM | POA: Insufficient documentation

## 2019-03-26 DIAGNOSIS — D120 Benign neoplasm of cecum: Secondary | ICD-10-CM | POA: Insufficient documentation

## 2019-03-26 DIAGNOSIS — R918 Other nonspecific abnormal finding of lung field: Secondary | ICD-10-CM | POA: Insufficient documentation

## 2019-03-26 DIAGNOSIS — Z90710 Acquired absence of both cervix and uterus: Secondary | ICD-10-CM | POA: Diagnosis not present

## 2019-03-26 DIAGNOSIS — E7800 Pure hypercholesterolemia, unspecified: Secondary | ICD-10-CM | POA: Diagnosis not present

## 2019-03-26 DIAGNOSIS — C541 Malignant neoplasm of endometrium: Secondary | ICD-10-CM

## 2019-03-26 NOTE — Patient Instructions (Signed)
**Note De-Identified Calia Obfuscation** Please call Dr. Amalia Hailey' office to make appointment for 6 month surveillance visit (around 09/26/2019). Please call our clinic if any concerning symptoms and we will see you sooner. It was a pleasure seeing you today and thank you for allowing [place] to participate in your care. - Dr. Fransisca Connors & Beckey Rutter, NP

## 2019-03-26 NOTE — Progress Notes (Signed)
**Note De-Identified Messinger Obfuscation** Gynecologic Oncology Interval Visit   Referring Provider: Dr. Amalia Hailey  Chief Concern: Grade 1 endometrial cancer  Subjective:  Suzanne Jennings is a 68 y.o. P2 female , initially seen in consultation for Dr. Amalia Hailey for FIGO IA grade 1 endometrioid cancer, returns to clinic today for follow-up.   She had chest CT to follow-up on calcified right hilar and mediastinal nodes and nonspecific groundglass opacities.  Imaging on 06/03/2018 reported as stable and unchanged.  Recommended follow-up CT in 2 years (05/2020).   Today she reports feeling well and denies specific complaints.  Denies vaginal bleeding or pain.  Denies early satiety.  Denies changes in bowel or bladder habits.  Gynecologic Oncology History:  Patient initially presented with report of 3 weeks of vaginal bleeding. Endometrial biopsy 01/17/18 by Dr Amalia Hailey showed grade 1 endometrial cancer with focal polyp.   CXR 02/15/18:  IMPRESSION: 6 mm nodular opacity right perihilar region. Advise noncontrast enhanced chest CT to further assess. Lungs elsewhere clear.  Chest CT 7/19 Calcified RIGHT upper lobe granuloma accounting for chest radiograph finding with associated calcified RIGHT hilar and mediastinal nodes.  Additional nonspecific ground-glass opacities up to 5 mm diameter in RIGHT lung, recommendation below.  Non-contrast chest CT at 3-6 months is recommended. If nodules persist and are stable at that time, consider additional non-contrast chest CT examinations at 2 and 4 years. This recommendation follows the consensus statement: Guidelines for Management of Incidental Pulmonary Nodules Detected on CT Images: From the Fleischner Society 2017; Radiology 2017; 284:228-243.  On 02/20/18, she underwent TLH, BSO with pelvic SLN mapping and biopsies, minilaparotomy with Dr. Fransisca Connors and Dr. Leonides Schanz at [facility]. Uterus was enlarged to about 12 week size d/t size of myomas.   DIAGNOSIS:  A. UTERUS WITH CERVIX; HYSTERECTOMY:  - ENDOMETRIOID  CARCINOMA, FIGO 1.  - NEGATIVE FOR MYOMETRIAL INVASION, CERVICAL INVOLVEMENT, AND SEROSAL INVOLVEMENT.  - MULTIPLE SUBSEROSAL, INTRAMURAL, AND SUBMUCOSAL LEIOMYOMAS (404 GRAM UTERUS).   BILATERAL FALLOPIAN TUBES AND OVARIES; SALPINGO-OOPHORECTOMY:  - NEGATIVE FOR MALIGNANCY.  - ATROPHY.   B. SENTINEL LYMPH NODE, RIGHT PELVIC; EXCISION:  - NEGATIVE FOR MALIGNANCY, ONE LYMPH NODE (0/1).   C. SENTINEL LYMPH NODE, LEFT PELVIC; EXCISION:  - NEGATIVE FOR MALIGNANCY, ONE LYMPH NODE (0/1).  Comment:  The bulk of the endometrial carcinoma involves an endometrial polyp. The background endometrium exhibits atypical hyperplasia/EIN and focal carcinoma.  A. PELVIC WASHINGS:  - NEGATIVE FOR MALIGNANCY.  - MESOTHELIAL CELLS AND MACROPHAGES.  Immunohistochemistry (IHC) Testing for DNA Mismatch Repair (MMR)  Proteins:  Results:  MLH1: Intact nuclear expression  MSH2: Intact nuclear expression  MSH6: Intact nuclear expression  PMS2: Intact nuclear expression  IHC Interpretation: No loss of nuclear expression of MMR proteins: Low probability of MSI-H.   Problem List: Patient Active Problem List   Diagnosis Date Noted  . Endometrial cancer ([place]) 10/02/2018  . Post-menopausal bleeding 12/10/2017  . Type 2 diabetes mellitus without complications ([place]) 23/55/7322  . Advanced care planning/counseling discussion 03/12/2017  . Benign neoplasm of descending colon   . Polyp of sigmoid colon   . Benign neoplasm of cecum   . Metabolic syndrome 02/54/2706  . 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 Hypercholesteremia 09/21/2015  . Hypertension 08/30/2015  . Osteoarthritis (arthritis due to wear and tear of joints) 08/30/2015  . Obesity 08/30/2015    Past Medical History: Past Medical History:  Diagnosis Date  . Diabetes mellitus without complication ([place])   . Endometrial cancer ([place])   . History of kidney stones   . **Note De-Identified Shaneyfelt Obfuscation** Hyperlipidemia   . Hypertension   . Kidney stone   . Knee pain     Past  Surgical History: Past Surgical History:  Procedure Laterality Date  . APPENDECTOMY    . COLONOSCOPY WITH PROPOFOL N/A 07/03/2016   Procedure: COLONOSCOPY WITH PROPOFOL;  Surgeon: Lucilla Lame, MD;  Location: [facility];  Service: Endoscopy;  Laterality: N/A;  . LAPAROSCOPIC HYSTERECTOMY N/A 02/20/2018   Procedure: HYSTERECTOMY TOTAL LAPAROSCOPIC, WITH BILATERAL SALPINGOOPHERECTOMY;  Surgeon: Mellody Drown, MD;  Location: [facility];  Service: Gynecology;  Laterality: N/A;  . POLYPECTOMY  07/03/2016   Procedure: POLYPECTOMY;  Surgeon: Lucilla Lame, MD;  Location: [facility];  Service: Endoscopy;;  . SENTINEL NODE BIOPSY N/A 02/20/2018   Procedure: SENTINEL NODE BIOPSY AND MAPPING;  Surgeon: Mellody Drown, MD;  Location: [facility];  Service: Gynecology;  Laterality: N/A;  . TUBAL LIGATION      OB History:  OB History  Gravida Para Term Preterm AB Living  '2 2 1 1   2  ' SAB TAB Ectopic Multiple Live Births          2    # Outcome Date GA Lbr Len/2nd Weight Sex Delivery Anes PTL Lv  2 Term 1979    M Vag-Spont   LIV  1 Preterm 1974    F Vag-Spont   LIV    Family History: Family History  Problem Relation Age of Onset  . Emphysema Mother   . Heart disease Father        massive MI  . Hypertension Sister   . Mental illness Brother   . Hypertension Sister   . Hypertension Sister   . Diabetes Maternal Grandmother   . Breast cancer Neg Hx     Social History: Social History   Socioeconomic History  . Marital status: Married    Spouse name: Not on file  . Number of children: Not on file  . Years of education: Not on file  . Highest education level: Not on file  Occupational History  . Not on file  Social Needs  . Financial resource strain: Not on file  . Food insecurity    Worry: Not on file    Inability: Not on file  . Transportation needs    Medical: Not on file    Non-medical: Not on file  Tobacco Use  . Smoking status: Never Smoker  . Smokeless  tobacco: Never Used  Substance and Sexual Activity  . Alcohol use: No    Alcohol/week: 0.0 standard drinks  . Drug use: No  . Sexual activity: Yes    Birth control/protection: Post-menopausal  Lifestyle  . Physical activity    Days per week: Not on file    Minutes per session: Not on file  . Stress: Not on file  Relationships  . Social Herbalist on phone: Not on file    Gets together: Not on file    Attends religious service: Not on file    Active member of club or organization: Not on file    Attends meetings of clubs or organizations: Not on file    Relationship status: Not on file  . Intimate partner violence    Fear of current or ex partner: Not on file    Emotionally abused: Not on file    Physically abused: Not on file    Forced sexual activity: Not on file  Other Topics Concern  . Not on file  Social History Narrative  . **Note De-Identified Zahner Obfuscation** Not on file    Allergies: Allergies  Allergen Reactions  . Aspirin Other (See Comments)    "blood thinner, makes nose bleed - but pt can take 81 mg dose without problems  . Other Other (See Comments)    Yogurt causes yeast infections Nuts except peanuts and almonds cause anaphylaxis   . Penicillins Swelling and Rash    Has patient had a PCN reaction causing immediate rash, facial/tongue/throat swelling, SOB or lightheadedness with hypotension: Yes Has patient had a PCN reaction causing severe rash involving mucus membranes or skin necrosis: No Has patient had a PCN reaction that required hospitalization: No Has patient had a PCN reaction occurring within the last 10 years: No If all of the above answers are "NO", then may proceed with Cephalosporin use.   . Sulfa Antibiotics Swelling and Rash    Current Medications: Current Outpatient Medications  Medication Sig Dispense Refill  . aspirin EC 81 MG tablet Take 1 tablet (81 mg total) by mouth daily. 90 tablet 3  . atorvastatin (LIPITOR) 10 MG tablet Take 1 tablet (10 mg total) by  mouth daily. 90 tablet 1  . ONETOUCH VERIO test strip One Touch Verio- pt states she checks her sugar everyday (Patient not taking: Reported on 10/02/2018) 100 each 12  . OVER THE COUNTER MEDICATION Take 1 capsule by mouth daily. Zendocrine otc supplement    . Probiotic Product (PROBIOTIC PO) Take 1 capsule by mouth daily.     Marland Kitchen spironolactone (ALDACTONE) 25 MG tablet TAKE 1 TABLET BY MOUTH  DAILY 30 tablet 0   No current facility-administered medications for this visit.     Review of Systems General:  no complaints Skin: no complaints Eyes: no complaints HEENT: no complaints Breasts: no complaints Pulmonary: no complaints Cardiac: no complaints Gastrointestinal: no complaints Genitourinary/Sexual: no complaints Ob/Gyn: no complaints Musculoskeletal: no complaints Hematology: no complaints Neurologic/Psych: no complaints   Objective:  Physical Examination:  Vitals:   03/26/19 1400  BP: (!) 150/80  Pulse: (!) 102  Resp: 18  Temp: 98.5 F (36.9 C)  SpO2: 98%       ECOG Performance Status: 0 - Asymptomatic  GENERAL: Patient is a well appearing female in no acute distress HEENT:  Sclera clear. Anicteric NODES:  Negative axillary, supraclavicular, inguinal lymph node survery LUNGS:  Clear to auscultation bilaterally.   HEART:  Regular rate and rhythm.  ABDOMEN:  Soft, nontender.  No hernias, incisions well healed. No masses or ascites EXTREMITIES:  No peripheral edema. Atraumatic. No cyanosis SKIN:  Clear with no obvious rashes or skin changes.  NEURO:  Nonfocal. Well oriented.  Appropriate affect.  Pelvic: EGBUS/Vagina: normal, no lesions seen.  Bimanual/RV: normal.    Assessment:  Yolando Gillum Mealor is a 68 y.o. female diagnosed with non invasive stage IA grade 1 endometrial adenocarcinoma s/p TLH/BSO, SLNs 7/19.   No evidence of disease today.  Calcified RIGHT upper lobe granuloma on  Pre-op chest radiograph with Chest CT showing calcified RIGHT hilar and mediastinal  nodes. Additional nonspecific ground-glass opacities up to 5 mm diameter in RIGHT lung.  Follow-up CT is stable and unchanged.  Medical co-morbidities complicating care: diet controlled AODM.  Plan:   Problem List Items Addressed This Visit      Genitourinary   Endometrial cancer ([place]) - Primary     We will plan to have her return to clinic in 12 months for reevaluation and with Dr. Amalia Hailey in 6 months.  Can see her back sooner if any **Note De-Identified Garton Obfuscation** concerning symptoms.   Follow up noncontrast chest CT ordered and scheduled for 05/2020 in view of lung findings.  Suspect benign etiology.  Beckey Rutter, DNP, AGNP-C [place] [facility] [phone] (work cell) [phone] (office)  I personally interviewed and examined the patient. Agreed with the above/below plan of care. Patient/family questions were answered.  Mellody Drown, MD  CC:  Marnee Guarneri Dr. Amalia Hailey

## 2019-04-02 ENCOUNTER — Ambulatory Visit: Payer: Medicare Other

## 2019-04-16 ENCOUNTER — Other Ambulatory Visit: Payer: Self-pay | Admitting: Nurse Practitioner

## 2019-04-16 NOTE — Telephone Encounter (Signed)
**Note De-Identified Struthers Obfuscation** Called patient. Left detailed VM message (DPR reviewed) and asked patient to return call with any questions.

## 2019-04-16 NOTE — Telephone Encounter (Signed)
**Note De-Identified Nill Obfuscation** Please call patient and request she schedule follow-up for her chronic diseases, including diabetes.  Has not been seen since October.  Will supply 60 day supply only.  She needs follow-up please.

## 2019-04-16 NOTE — Telephone Encounter (Signed)
**Note De-Identified Kamer Obfuscation** Requested medication (s) are due for refill today: yes  Requested medication (s) are on the active medication list: yes  Last refill:  02/26/2019  Future visit scheduled: no  Notes to clinic: review for refill   Requested Prescriptions  Pending Prescriptions Disp Refills   atorvastatin (LIPITOR) 10 MG tablet [Pharmacy Med Name: ATORVASTATIN  10MG   TAB] 90 tablet 3    Sig: TAKE 1 TABLET BY MOUTH  DAILY     Cardiovascular:  Antilipid - Statins Failed - 04/16/2019  8:31 AM      Failed - Total Cholesterol in normal range and within 360 days    Cholesterol, Total  Date Value Ref Range Status  06/13/2018 205 (H) 100 - 199 mg/dL Final   Cholesterol Piccolo, Waived  Date Value Ref Range Status  06/29/2017 169 <200 mg/dL Final    Comment:                            Desirable                <200                         Borderline High      200- 239                         High                     >239          Failed - LDL in normal range and within 360 days    LDL Calculated  Date Value Ref Range Status  06/13/2018 111 (H) 0 - 99 mg/dL Final         Failed - HDL in normal range and within 360 days    HDL  Date Value Ref Range Status  06/13/2018 37 (L) >39 mg/dL Final         Failed - Triglycerides in normal range and within 360 days    Triglycerides  Date Value Ref Range Status  06/13/2018 284 (H) 0 - 149 mg/dL Final   Triglycerides Piccolo,Waived  Date Value Ref Range Status  06/29/2017 179 (H) <150 mg/dL Final    Comment:                            Normal                   <150                         Borderline High     150 - 199                         High                200 - 499                         Very High                >499          Passed - Patient is not pregnant      Passed - Valid encounter within last 12 months    Recent Outpatient **Note De-Identified Cranmore Obfuscation** Visits          10 months ago Annual physical exam   [facility] Venita Lick, NP   1 year  ago Abnormal EKG   [facility] [place], [place]   1 year ago Essential hypertension   [place] Kathrine Haddock, NP   1 year ago Essential hypertension   [place] Kathrine Haddock, NP   2 years ago Annual physical exam   [facility] Kathrine Haddock, NP

## 2019-04-16 NOTE — Telephone Encounter (Signed)
**Note De-identified Rua Obfuscation** Routing to provider  

## 2019-04-17 ENCOUNTER — Other Ambulatory Visit: Payer: Self-pay | Admitting: Nurse Practitioner

## 2019-04-17 MED ORDER — SPIRONOLACTONE 25 MG PO TABS: 25.0000 mg | ORAL_TABLET | Freq: Every day | ORAL | 0 refills | Status: DC

## 2019-04-17 NOTE — Telephone Encounter (Signed)
**Note De-identified Zahn Obfuscation** Routing to provider  

## 2019-04-17 NOTE — Telephone Encounter (Signed)
**Note De-Identified Ortman Obfuscation** Requested medication (s) are due for refill today: yes  Requested medication (s) are on the active medication list: yes  Last refill: 02/09/2019  Future visit scheduled: no  Notes to clinic:  Review for refill   Requested Prescriptions  Pending Prescriptions Disp Refills   spironolactone (ALDACTONE) 25 MG tablet 30 tablet 0    Sig: Take 1 tablet (25 mg total) by mouth daily.     Cardiovascular: Diuretics - Aldosterone Antagonist Failed - 04/17/2019 12:03 PM      Failed - Last BP in normal range    BP Readings from Last 1 Encounters:  03/26/19 (!) 150/80         Failed - Valid encounter within last 6 months    Recent Outpatient Visits          10 months ago Annual physical exam   [facility] Marnee Guarneri T, NP   1 year ago Abnormal EKG   [place], [place], [place]   1 year ago Essential hypertension   [place], [place], NP   1 year ago Essential hypertension   [place], NP   2 years ago Annual physical exam   [facility] Kathrine Haddock, NP             Passed - Cr in normal range and within 360 days    Creatinine, Ser  Date Value Ref Range Status  06/13/2018 0.87 0.57 - 1.00 mg/dL Final         Passed - K in normal range and within 360 days    Potassium  Date Value Ref Range Status  06/13/2018 4.4 3.5 - 5.2 mmol/L Final         Passed - Na in normal range and within 360 days    Sodium  Date Value Ref Range Status  06/13/2018 142 134 - 144 mmol/L Final

## 2019-04-17 NOTE — Telephone Encounter (Signed)
**Note De-identified Yorke Obfuscation** Called and left a detailed message for patient.  

## 2019-04-17 NOTE — Telephone Encounter (Signed)
**Note De-Identified Aspinall Obfuscation** Courtesy refill only, patient needs office visit.  Has not been in office since October 2019.

## 2019-04-17 NOTE — Telephone Encounter (Signed)
**Note De-Identified Naab Obfuscation** spironolactone (ALDACTONE) 25 MG tablet  [place], [place] [address] [place] [phone] (Phone) [phone] (Fax)   Pt had wrong med refilled yesterday. She was exposed to covid and is under Quarantine and her neighbor will pick it up. She will make am appt as soon as she gets out of house!

## 2019-05-17 ENCOUNTER — Other Ambulatory Visit: Payer: Self-pay | Admitting: Nurse Practitioner

## 2019-05-17 NOTE — Telephone Encounter (Signed)
**Note De-Identified Raglin Obfuscation** Requested medication (s) are due for refill today:  yes  Requested medication (s) are on the active medication list:  yes  Future visit scheduled:  No  Last Refill:  04/17/19; Courtesy refill #30; no refills  Note to clinic:  Attempted to call pt. To advise she is overdue for OV; left voice message to call office M-F to schedule an appt.   Requested Prescriptions  Pending Prescriptions Disp Refills   spironolactone (ALDACTONE) 25 MG tablet [Pharmacy Med Name: SPIRONOLACTONE 25 MG TAB] 30 tablet 0    Sig: TAKE 1 TABLET BY MOUTH ONCE DAILY     Cardiovascular: Diuretics - Aldosterone Antagonist Failed - 05/17/2019  2:47 PM      Failed - Last BP in normal range    BP Readings from Last 1 Encounters:  03/26/19 (!) 150/80         Failed - Valid encounter within last 6 months    Recent Outpatient Visits          11 months ago Annual physical exam   [facility] Marnee Guarneri T, NP   1 year ago Abnormal EKG   [place], [place], [place]   1 year ago Essential hypertension   [place] Kathrine Haddock, NP   1 year ago Essential hypertension   [place] Kathrine Haddock, NP   2 years ago Annual physical exam   [facility] Kathrine Haddock, NP             Passed - Cr in normal range and within 360 days    Creatinine, Ser  Date Value Ref Range Status  06/13/2018 0.87 0.57 - 1.00 mg/dL Final         Passed - K in normal range and within 360 days    Potassium  Date Value Ref Range Status  06/13/2018 4.4 3.5 - 5.2 mmol/L Final         Passed - Na in normal range and within 360 days    Sodium  Date Value Ref Range Status  06/13/2018 142 134 - 144 mmol/L Final

## 2019-05-19 NOTE — Telephone Encounter (Signed)
**Note De-Identified Tromp Obfuscation** Needs appointment  For further.

## 2020-01-15 ENCOUNTER — Telehealth: Payer: Self-pay | Admitting: Nurse Practitioner

## 2020-01-15 NOTE — Telephone Encounter (Signed)
**Note De-Identified Miralles Obfuscation** I would recommend if she is concerned about carrying virus that she obtain Covid testing today, this can [facility] at [facility] rapid test with same day results or [place] appointment with [facility], this would be made [place] their web site.  This may be beneficial since her vaccine was on May 18th and this was some days away.  At this time I would treat symptoms until Covid testing returns.  If worsening or ongoing symptoms would benefit from virtual visit with a provider in office.  Thank you.

## 2020-01-15 NOTE — Telephone Encounter (Signed)
**Note De-Identified Beitz Obfuscation** I would recommend Diabetic Tussin or Coricidin for cough and symptoms, these are safer to take with her diabetes and hypertension.

## 2020-01-15 NOTE — Telephone Encounter (Signed)
**Note De-Identified Grammatico Obfuscation** Please advise  Copied from [place] [phone]. Topic: General - Other >> Jan 15, 2020  9:16 AM Greggory Keen D wrote: Reason for CRM: Pt has had her 2 pfizer vaccines/  Her last one being May 18th.  She states she has had a cough since some fever off and on.  She did have an immediate reaction to the last vaccine with all the symptoms.  She wants to know what she needs to do.  Have a covid test vs an antibodies test and or treat the symptoms.  Her family is afraid she is carrying  the covid virus.  CB#  [phone]

## 2020-01-15 NOTE — Telephone Encounter (Signed)
**Note De-Identified Jaggers Obfuscation** Spoke with pt, [facility]. She asked if there is anything OTC she can/should be taking to treat the cough and mucous? She is not longer having fevers, however has experienced loss of taste.

## 2020-01-15 NOTE — Telephone Encounter (Signed)
**Note De-Identified Kosar Obfuscation** Advised pt of Suzanne Jennings's advice. Pt stated covid test today was negative.

## 2020-02-09 ENCOUNTER — Telehealth: Payer: Self-pay | Admitting: Nurse Practitioner

## 2020-02-09 ENCOUNTER — Other Ambulatory Visit: Payer: Self-pay | Admitting: Nurse Practitioner

## 2020-02-09 ENCOUNTER — Ambulatory Visit (INDEPENDENT_AMBULATORY_CARE_PROVIDER_SITE_OTHER): Payer: Medicare Other

## 2020-02-09 VITALS — Ht 61.0 in | Wt 167.5 lb

## 2020-02-09 DIAGNOSIS — Z0000 Encounter for general adult medical examination without abnormal findings: Secondary | ICD-10-CM

## 2020-02-09 DIAGNOSIS — Z1231 Encounter for screening mammogram for malignant neoplasm of breast: Secondary | ICD-10-CM | POA: Diagnosis not present

## 2020-02-09 MED ORDER — ATORVASTATIN CALCIUM 10 MG PO TABS: 10.0000 mg | ORAL_TABLET | Freq: Every day | ORAL | 0 refills | Status: DC

## 2020-02-09 MED ORDER — SPIRONOLACTONE 25 MG PO TABS: 25.0000 mg | ORAL_TABLET | Freq: Every day | ORAL | 0 refills | Status: DC

## 2020-02-09 NOTE — Telephone Encounter (Signed)
**Note De-Identified Doe Obfuscation** I have sent in a 60 day supply only, but patient needs follow-up.  She has not been seen in office since October 2019.

## 2020-02-09 NOTE — Progress Notes (Signed)
**Note De-Identified Burkes Obfuscation** I connected with Dania Walls today by telephone and verified that I am speaking with the correct person using two identifiers. Location patient: home Location provider: work Persons participating in the virtual visit: Luane School LPN.   I discussed the limitations, risks, security and privacy concerns of performing an evaluation and management service by telephone and the availability of in person appointments. I also discussed with the patient that there may be a patient responsible charge related to this service. The patient expressed understanding and verbally consented to this telephonic visit.    Interactive audio and video telecommunications were attempted between this provider and patient, however failed, due to patient having technical difficulties OR patient did not have access to video capability.  We continued and completed visit with audio only.     Vital signs may be missing or patient reported.  Subjective:   Suzanne Jennings is a 69 y.o. female who presents for an Initial Medicare Annual Wellness Visit.  Review of Systems     Cardiac Risk Factors include: advanced age (>35men, >66 women);diabetes mellitus;hypertension;obesity (BMI >30kg/m2)     Objective:    Today's Vitals   02/09/20 0901  Weight: 167 lb 8 oz (76 kg)  Height: 5\' 1"  (1.549 m)   Body mass index is 31.65 kg/m.  Advanced Directives 02/09/2020 10/02/2018 03/20/2018 01/30/2018 03/29/2017 03/12/2017 07/03/2016  Does Patient Have a Medical Advance Directive? Yes No No No No No No  Type of Paramedic of [place];Living will - - - - - -  Copy of [place] in Chart? No - copy requested - - - - - -  Would patient like information on creating a medical advance directive? - No - Patient declined No - Patient declined Yes (MAU/Ambulatory/Procedural Areas - Information given) No - Patient declined No - Patient declined No - patient declined information    Current  Medications (verified) Outpatient Encounter Medications as of 02/09/2020  Medication Sig  . aspirin EC 81 MG tablet Take 1 tablet (81 mg total) by mouth daily.  Marland Kitchen atorvastatin (LIPITOR) 10 MG tablet TAKE 1 TABLET BY MOUTH  DAILY  . ONETOUCH VERIO test strip One Touch Verio- pt states she checks her sugar everyday  . OVER THE COUNTER MEDICATION Take 1 capsule by mouth daily. Zendocrine otc supplement  . Probiotic Product (PROBIOTIC PO) Take 1 capsule by mouth daily.   Marland Kitchen spironolactone (ALDACTONE) 25 MG tablet TAKE 1 TABLET BY MOUTH ONCE DAILY   No facility-administered encounter medications on file as of 02/09/2020.    Allergies (verified) Aspirin, Other, Penicillins, and Sulfa antibiotics   History: Past Medical History:  Diagnosis Date  . Diabetes mellitus without complication ([place])   . Endometrial cancer ([place])   . History of kidney stones   . Hyperlipidemia   . Hypertension   . Kidney stone   . Knee pain    Past Surgical History:  Procedure Laterality Date  . APPENDECTOMY    . COLONOSCOPY WITH PROPOFOL N/A 07/03/2016   Procedure: COLONOSCOPY WITH PROPOFOL;  Surgeon: Lucilla Lame, MD;  Location: [facility];  Service: Endoscopy;  Laterality: N/A;  . LAPAROSCOPIC HYSTERECTOMY N/A 02/20/2018   Procedure: HYSTERECTOMY TOTAL LAPAROSCOPIC, WITH BILATERAL SALPINGOOPHERECTOMY;  Surgeon: Mellody Drown, MD;  Location: [facility];  Service: Gynecology;  Laterality: N/A;  . POLYPECTOMY  07/03/2016   Procedure: POLYPECTOMY;  Surgeon: Lucilla Lame, MD;  Location: [facility];  Service: Endoscopy;;  . SENTINEL NODE BIOPSY N/A 02/20/2018 **Note De-Identified Jerome Obfuscation** Procedure: SENTINEL NODE BIOPSY AND MAPPING;  Surgeon: Mellody Drown, MD;  Location: [facility];  Service: Gynecology;  Laterality: N/A;  . TUBAL LIGATION     Family History  Problem Relation Age of Onset  . Emphysema Mother   . Heart disease Father        massive MI  . Hypertension Sister   . Mental illness Brother   . Hypertension  Sister   . Hypertension Sister   . Diabetes Maternal Grandmother   . Breast cancer Neg Hx    Social History   Socioeconomic History  . Marital status: Married    Spouse name: Not on file  . Number of children: Not on file  . Years of education: Not on file  . Highest education level: Not on file  Occupational History  . Occupation: retired  Tobacco Use  . Smoking status: Never Smoker  . Smokeless tobacco: Never Used  Vaping Use  . Vaping Use: Never used  Substance and Sexual Activity  . Alcohol use: No    Alcohol/week: 0.0 standard drinks  . Drug use: No  . Sexual activity: Yes    Birth control/protection: Post-menopausal  Other Topics Concern  . Not on file  Social History Narrative  . Not on file   Social Determinants of Health   Financial Resource Strain: Low Risk   . Difficulty of Paying Living Expenses: Not hard at all  Food Insecurity: No Food Insecurity  . Worried About Charity fundraiser in the Last Year: Never true  . Ran Out of Food in the Last Year: Never true  Transportation Needs: No Transportation Needs  . Lack of Transportation (Medical): No  . Lack of Transportation (Non-Medical): No  Physical Activity: Sufficiently Active  . Days of Exercise per Week: 7 days  . Minutes of Exercise per Session: 40 min  Stress: No Stress Concern Present  . Feeling of Stress : Not at all  Social Connections:   . Frequency of Communication with Friends and Family:   . Frequency of Social Gatherings with Friends and Family:   . Attends Religious Services:   . Active Member of Clubs or Organizations:   . Attends Archivist Meetings:   Marland Kitchen Marital Status:     Tobacco Counseling Counseling given: Not Answered   Clinical Intake:  Pre-visit preparation completed: Yes  Pain : No/denies pain     Nutritional Status: BMI > 30  Obese Nutritional Risks: None Diabetes: Yes CBG done?: No Did pt. bring in CBG monitor from home?: No  How often do you need  to have someone help you when you read instructions, pamphlets, or other written materials from your doctor or pharmacy?: 1 - Never What is the last grade level you completed in school?: 12th grade  Diabetic? Yes Nutrition Risk Assessment:  Has the patient had any N/V/D within the last 2 months?  No  Does the patient have any non-healing wounds?  No  Has the patient had any unintentional weight loss or weight gain?  No   Diabetes:  Is the patient diabetic?  Yes  If diabetic, was a CBG obtained today?  Yes } How often do you monitor your CBG's? Every other day.   Financial Strains and Diabetes Management:  Are you having any financial strains with the device, your supplies or your medication? No .  Does the patient want to be seen by Chronic Care Management for management of their diabetes?  No  Would the patient **Note De-Identified [place]e Obfuscation** like to be referred to a Nutritionist or for Diabetic Management?  No   Diabetic Exams:  Diabetic Eye Exam: Completed due September Diabetic Foot Exam: Overdue, Pt has been advised about the importance in completing this exam. Pt is scheduled for diabetic foot exam on next PCP visit.   Interpreter Needed?: No  Information entered by :: NAllen LPN   Activities of Daily Living In your present state of health, do you have any difficulty performing the following activities: 02/09/2020  Hearing? N  Vision? N  Difficulty concentrating or making decisions? N  Walking or climbing stairs? N  Dressing or bathing? N  Doing errands, shopping? N  Preparing Food and eating ? N  Using the Toilet? N  In the past six months, have you accidently leaked urine? N  Do you have problems with loss of bowel control? N  Managing your Medications? N  Managing your Finances? N  Housekeeping or managing your Housekeeping? N  Some recent data might be hidden    Patient Care Team: Venita Lick, NP as PCP - General (Nurse Practitioner) Clent Jacks, RN as Registered  Nurse  Indicate any recent Medical Services you may have received from other than [facility] providers in the past year (date may be approximate).     Assessment:   This is a routine wellness examination for Suzanne Jennings.  Hearing/Vision screen  Hearing Screening   125Hz  250Hz  500Hz  1000Hz  2000Hz  3000Hz  4000Hz  6000Hz  8000Hz   Right ear:           Left ear:           Vision Screening Comments: Regular Eye Exams, [facility]  Dietary issues and exercise activities discussed: Current Exercise Habits: Home exercise routine, Type of exercise: walking, Time (Minutes): 40, Frequency (Times/Week): 7, Weekly Exercise (Minutes/Week): 280  Goals    . Patient Stated     02/09/2020, wants to get to 125 pounds      Depression Screen PHQ 2/9 Scores 02/09/2020 06/13/2018 03/29/2017 03/12/2017 09/21/2015 08/30/2015  PHQ - 2 Score 0 0 0 0 0 0  PHQ- 9 Score - 0 - 0 - -    Fall Risk Fall Risk  02/09/2020 06/13/2018 06/28/2017 06/21/2017 06/14/2017  Falls in the past year? 0 No (No Data) (No Data) No  Comment - - no falls since previous visit no falls past wk -  Risk for fall due to : Medication side effect - - - -  Follow up Falls evaluation completed;Education provided;Falls prevention discussed - - - -    Any stairs in or around the home? Yes  If so, are there any without handrails? Yes  Home free of loose throw rugs in walkways, pet beds, electrical cords, etc? Yes  Adequate lighting in your home to reduce risk of falls? Yes   ASSISTIVE DEVICES UTILIZED TO PREVENT FALLS:  Life alert? No  Use of a cane, walker or w/c? No  Grab bars in the bathroom? No  Shower chair or bench in shower? No  Elevated toilet seat or a handicapped toilet? No   TIMED UP AND GO:  Was the test performed? No . .     Cognitive Function:     6CIT Screen 02/09/2020  What Year? 0 points  What month? 0 points  What time? 0 points  Count back from 20 0 points  Months in reverse 0 points  Repeat phrase 4 points   Total Score 4    Immunizations Immunization History  Administered **Note De-Identified [place] Obfuscation** Date(s) Administered  . PFIZER SARS-COV-2 Vaccination 12/09/2019, 12/30/2019  . Td 09/17/2015    TDAP status: Up to date Flu Vaccine status: Declined, Education has been provided regarding the importance of this vaccine but patient still declined. Advised may receive this vaccine at local pharmacy or Health Dept. Aware to provide a copy of the vaccination record if obtained from local pharmacy or Health Dept. Verbalized acceptance and understanding. Pneumococcal vaccine status: Declined,  Education has been provided regarding the importance of this vaccine but patient still declined. Advised may receive this vaccine at local pharmacy or Health Dept. Aware to provide a copy of the vaccination record if obtained from local pharmacy or Health Dept. Verbalized acceptance and understanding.  Covid-19 vaccine status: Completed vaccines  Qualifies for Shingles Vaccine? Yes   Zostavax completed No   Shingrix Completed?: No.    Education has been provided regarding the importance of this vaccine. Patient has been advised to call insurance company to determine out of pocket expense if they have not yet received this vaccine. Advised may also receive vaccine at local pharmacy or Health Dept. Verbalized acceptance and understanding.  Screening Tests Health Maintenance  Topic Date Due  . MAMMOGRAM  10/04/2017  . OPHTHALMOLOGY EXAM  06/27/2018  . FOOT EXAM  06/29/2018  . HEMOGLOBIN A1C  12/12/2018  . URINE MICROALBUMIN  06/14/2019  . PNA vac Low Risk Adult (1 of 2 - PCV13) 02/08/2021 (Originally 04/23/2014)  . INFLUENZA VACCINE  03/14/2020  . COLONOSCOPY  07/03/2021  . TETANUS/TDAP  09/16/2025  . DEXA SCAN  Completed  . COVID-19 Vaccine  Completed  . Hepatitis C Screening  Completed    Health Maintenance  Health Maintenance Due  Topic Date Due  . MAMMOGRAM  10/04/2017  . OPHTHALMOLOGY EXAM  06/27/2018  . FOOT EXAM   06/29/2018  . HEMOGLOBIN A1C  12/12/2018  . URINE MICROALBUMIN  06/14/2019    Colorectal cancer screening: Completed 07/03/2016. Repeat every 5 years Mammogram status: Completed 10/05/2015. Repeat every year Bone Density status: Completed 10/05/2015 .  Lung Cancer Screening: (Low Dose CT Chest recommended if Age 22-80 years, 30 pack-year currently smoking OR have quit w/in 15years.) does not qualify.   Lung Cancer Screening Referral: no  Additional Screening:  Hepatitis C Screening: does qualify; Completed 08/30/2015   Vision Screening: Recommended annual ophthalmology exams for early detection of glaucoma and other disorders of the eye. Is the patient up to date with their annual eye exam?  Yes  Who is the provider or what is the name of the office in which the patient attends annual eye exams? [facility] If pt is not established with a provider, would they like to be referred to a provider to establish care? No .   Dental Screening: Recommended annual dental exams for proper oral hygiene  Community Resource Referral / Chronic Care Management: CRR required this visit?  No   CCM required this visit?  No      Plan:     I have personally reviewed and noted the following in the patient's chart:   . Medical and social history . Use of alcohol, tobacco or illicit drugs  . Current medications and supplements . Functional ability and status . Nutritional status . Physical activity . Advanced directives . List of other physicians . Hospitalizations, surgeries, and ER visits in previous 12 months . Vitals . Screenings to include cognitive, depression, and falls . Referrals and appointments  In addition, I have reviewed and discussed with **Note De-Identified Jeff Obfuscation** patient certain preventive protocols, quality metrics, and best practice recommendations. A written personalized care plan for preventive services as well as general preventive health recommendations were provided to patient.      Kellie Simmering, LPN   05/12/2445   Nurse Notes: Wants to have mammogram at the same place she had last one. Does not remember exactly where.

## 2020-02-09 NOTE — Telephone Encounter (Signed)
**Note De-identified Chiang Obfuscation** Appt scheduled

## 2020-02-09 NOTE — Telephone Encounter (Signed)
**Note De-Identified [place] Obfuscation** Pt states she is out of medication, atorvastatin, Spironolactone. Please advise.

## 2020-02-09 NOTE — Telephone Encounter (Signed)
**Note De-identified Knoble Obfuscation** Routing to provider  

## 2020-02-09 NOTE — Patient Instructions (Signed)
**Note De-Identified Shorty Obfuscation** Suzanne Jennings , Thank you for taking time to come for your Medicare Wellness Visit. I appreciate your ongoing commitment to your health goals. Please review the following plan we discussed and let me know if I can assist you in the future.   Screening recommendations/referrals: Colonoscopy: completed 07/03/2016, due 07/03/2021 Mammogram: referred today Bone Density: completed 10/05/2015 Recommended yearly ophthalmology/optometry visit for glaucoma screening and checkup Recommended yearly dental visit for hygiene and checkup  Vaccinations: Influenza vaccine: decline Pneumococcal vaccine: decline Tdap vaccine: completed 09/17/2015, due 09/16/2025 Shingles vaccine: decline   Covid-19:12/09/2019, 12/30/2019  Advanced directives: Please bring a copy of your POA (Power of Attorney) and/or Living Will to your next appointment.   Conditions/risks identified: obesity  Next appointment: Follow up in one year for your annual wellness visit    Preventive Care 71 Years and Older, Female Preventive care refers to lifestyle choices and visits with your health care provider that can promote health and wellness. What does preventive care include?  A yearly physical exam. This is also called an annual well check.  Dental exams once or twice a year.  Routine eye exams. Ask your health care provider how often you should have your eyes checked.  Personal lifestyle choices, including:  Daily care of your teeth and gums.  Regular physical activity.  Eating a healthy diet.  Avoiding tobacco and drug use.  Limiting alcohol use.  Practicing safe sex.  Taking low-dose aspirin every day.  Taking vitamin and mineral supplements as recommended by your health care provider. What happens during an annual well check? The services and screenings done by your health care provider during your annual well check will depend on your age, overall health, lifestyle risk factors, and family history of  disease. Counseling  Your health care provider may ask you questions about your:  Alcohol use.  Tobacco use.  Drug use.  Emotional well-being.  Home and relationship well-being.  Sexual activity.  Eating habits.  History of falls.  Memory and ability to understand (cognition).  Work and work Statistician.  Reproductive health. Screening  You may have the following tests or measurements:  Height, weight, and BMI.  Blood pressure.  Lipid and cholesterol levels. These may be checked every 5 years, or more frequently if you are over 21 years old.  Skin check.  Lung cancer screening. You may have this screening every year starting at age 71 if you have a 30-pack-year history of smoking and currently smoke or have quit within the past 15 years.  Fecal occult blood test (FOBT) of the stool. You may have this test every year starting at age 25.  Flexible sigmoidoscopy or colonoscopy. You may have a sigmoidoscopy every 5 years or a colonoscopy every 10 years starting at age 76.  Hepatitis C blood test.  Hepatitis B blood test.  Sexually transmitted disease (STD) testing.  Diabetes screening. This is done by checking your blood sugar (glucose) after you have not eaten for a while (fasting). You may have this done every 1-3 years.  Bone density scan. This is done to screen for osteoporosis. You may have this done starting at age 62.  Mammogram. This may be done every 1-2 years. Talk to your health care provider about how often you should have regular mammograms. Talk with your health care provider about your test results, treatment options, and if necessary, the need for more tests. Vaccines  Your health care provider may recommend certain vaccines, such as:  Influenza vaccine. This is recommended **Note De-Identified Wenrick Obfuscation** every year.  Tetanus, diphtheria, and acellular pertussis (Tdap, Td) vaccine. You may need a Td booster every 10 years.  Zoster vaccine. You may need this after age  19.  Pneumococcal 13-valent conjugate (PCV13) vaccine. One dose is recommended after age 71.  Pneumococcal polysaccharide (PPSV23) vaccine. One dose is recommended after age 71. Talk to your health care provider about which screenings and vaccines you need and how often you need them. This information is not intended to replace advice given to you by your health care provider. Make sure you discuss any questions you have with your health care provider. Document Released: 08/27/2015 Document Revised: 04/19/2016 Document Reviewed: 06/01/2015 Elsevier Interactive Patient Education  2017 [place] Prevention in the Home Falls can cause injuries. They can happen to people of all ages. There are many things you can do to make your home safe and to help prevent falls. What can I do on the outside of my home?  Regularly fix the edges of walkways and driveways and fix any cracks.  Remove anything that might make you trip as you walk through a door, such as a raised step or threshold.  Trim any bushes or trees on the path to your home.  Use bright outdoor lighting.  Clear any walking paths of anything that might make someone trip, such as rocks or tools.  Regularly check to see if handrails are loose or broken. Make sure that both sides of any steps have handrails.  Any raised decks and porches should have guardrails on the edges.  Have any leaves, snow, or ice cleared regularly.  Use sand or salt on walking paths during winter.  Clean up any spills in your garage right away. This includes oil or grease spills. What can I do in the bathroom?  Use night lights.  Install grab bars by the toilet and in the tub and shower. Do not use towel bars as grab bars.  Use non-skid mats or decals in the tub or shower.  If you need to sit down in the shower, use a plastic, non-slip stool.  Keep the floor dry. Clean up any water that spills on the floor as soon as it happens.  Remove  soap buildup in the tub or shower regularly.  Attach bath mats securely with double-sided non-slip rug tape.  Do not have throw rugs and other things on the floor that can make you trip. What can I do in the bedroom?  Use night lights.  Make sure that you have a light by your bed that is easy to reach.  Do not use any sheets or blankets that are too big for your bed. They should not hang down onto the floor.  Have a firm chair that has side arms. You can use this for support while you get dressed.  Do not have throw rugs and other things on the floor that can make you trip. What can I do in the kitchen?  Clean up any spills right away.  Avoid walking on wet floors.  Keep items that you use a lot in easy-to-reach places.  If you need to reach something above you, use a strong step stool that has a grab bar.  Keep electrical cords out of the way.  Do not use floor polish or wax that makes floors slippery. If you must use wax, use non-skid floor wax.  Do not have throw rugs and other things on the floor that can make you trip. What **Note De-Identified [place]acox Obfuscation** can I do with my stairs?  Do not leave any items on the stairs.  Make sure that there are handrails on both sides of the stairs and use them. Fix handrails that are broken or loose. Make sure that handrails are as long as the stairways.  Check any carpeting to make sure that it is firmly attached to the stairs. Fix any carpet that is loose or worn.  Avoid having throw rugs at the top or bottom of the stairs. If you do have throw rugs, attach them to the floor with carpet tape.  Make sure that you have a light switch at the top of the stairs and the bottom of the stairs. If you do not have them, ask someone to add them for you. What else can I do to help prevent falls?  Wear shoes that:  Do not have high heels.  Have rubber bottoms.  Are comfortable and fit you well.  Are closed at the toe. Do not wear sandals.  If you use a  stepladder:  Make sure that it is fully opened. Do not climb a closed stepladder.  Make sure that both sides of the stepladder are locked into place.  Ask someone to hold it for you, if possible.  Clearly mark and make sure that you can see:  Any grab bars or handrails.  First and last steps.  Where the edge of each step is.  Use tools that help you move around (mobility aids) if they are needed. These include:  Canes.  Walkers.  Scooters.  Crutches.  Turn on the lights when you go into a dark area. Replace any light bulbs as soon as they burn out.  Set up your furniture so you have a clear path. Avoid moving your furniture around.  If any of your floors are uneven, fix them.  If there are any pets around you, be aware of where they are.  Review your medicines with your doctor. Some medicines can make you feel dizzy. This can increase your chance of falling. Ask your doctor what other things that you can do to help prevent falls. This information is not intended to replace advice given to you by your health care provider. Make sure you discuss any questions you have with your health care provider. Document Released: 05/27/2009 Document Revised: 01/06/2016 Document Reviewed: 09/04/2014 Elsevier Interactive Patient Education  2017 [employer].

## 2020-02-20 ENCOUNTER — Encounter: Payer: Self-pay | Admitting: Nurse Practitioner

## 2020-02-25 ENCOUNTER — Ambulatory Visit (INDEPENDENT_AMBULATORY_CARE_PROVIDER_SITE_OTHER): Payer: Medicare Other | Admitting: Nurse Practitioner

## 2020-02-25 ENCOUNTER — Other Ambulatory Visit: Payer: Self-pay

## 2020-02-25 ENCOUNTER — Encounter: Payer: Self-pay | Admitting: Nurse Practitioner

## 2020-02-25 DIAGNOSIS — E1169 Type 2 diabetes mellitus with other specified complication: Secondary | ICD-10-CM | POA: Diagnosis not present

## 2020-02-25 DIAGNOSIS — E1159 Type 2 diabetes mellitus with other circulatory complications: Secondary | ICD-10-CM

## 2020-02-25 DIAGNOSIS — Z1231 Encounter for screening mammogram for malignant neoplasm of breast: Secondary | ICD-10-CM | POA: Diagnosis not present

## 2020-02-25 DIAGNOSIS — E785 Hyperlipidemia, unspecified: Secondary | ICD-10-CM

## 2020-02-25 DIAGNOSIS — I10 Essential (primary) hypertension: Secondary | ICD-10-CM

## 2020-02-25 DIAGNOSIS — Z8542 Personal history of malignant neoplasm of other parts of uterus: Secondary | ICD-10-CM | POA: Diagnosis not present

## 2020-02-25 LAB — MICROALBUMIN, URINE WAIVED
Creatinine, Urine Waived: 100 mg/dL (ref 10–300)
Microalb, Ur Waived: 80 mg/L — ABNORMAL HIGH (ref 0–19)
Microalb/Creat Ratio: <30 mg/g (ref <30)

## 2020-02-25 LAB — BAYER DCA HB A1C WAIVED: HB A1C (BAYER DCA - WAIVED): 6.8 % (ref <7.0)

## 2020-02-25 MED ORDER — LOSARTAN POTASSIUM 25 MG PO TABS: 25.0000 mg | ORAL_TABLET | Freq: Every day | ORAL | 4 refills | Status: DC

## 2020-02-25 MED ORDER — ATORVASTATIN CALCIUM 10 MG PO TABS: 10.0000 mg | ORAL_TABLET | Freq: Every day | ORAL | 4 refills | Status: DC

## 2020-02-25 NOTE — Progress Notes (Signed)
**Note De-Identified Kmetz Obfuscation** BP 130/78 (BP Location: Left Arm)   Pulse 81   Temp 98.3 F (36.8 C) (Oral)   Ht 5' 0.2" (1.529 m)   Wt 173 lb (78.5 kg)   LMP  (LMP Unknown)   SpO2 98%   BMI 33.56 kg/m    Subjective:    Patient ID: Suzanne Jennings, female    DOB: 10-10-1950, 69 y.o.   MRN: [id number]  HPI: Suzanne Jennings is a 69 y.o. female  Chief Complaint  Patient presents with  . Diabetes  . Hyperlipidemia  . Hypertension   DIABETES Currently diet controlled.  Lost to follow-up, has not been seen in office since October 2019 when A1C was 7.6%, at that time she refused medication.  Drinks a lot of water and walks a lot.  Has never taken medication for her diabetes. Hypoglycemic episodes:no Polydipsia/polyuria: no Visual disturbance: no Chest pain: no Paresthesias: no Glucose Monitoring: yes  Accucheck frequency: Daily  Fasting glucose: 99 to 109 -- fluctuate    Post prandial:  Evening:  Before meals: Taking Insulin?: no  Long acting insulin:  Short acting insulin: Blood Pressure Monitoring: daily Retinal Examination: Not up to Date -- scheduled in September Foot Exam: Up to Date Pneumovax: refused Influenza: Up to Date Aspirin: yes   HYPERTENSION / HYPERLIPIDEMIA Continues on Atorvastatin and Spironolactone + ASA.  Lost to follow-up and has not been seen in office since October 2019. Satisfied with current treatment? yes Duration of hypertension: chronic BP monitoring frequency: daily BP range: 147/77 last check -- often lower then this BP medication side effects: no Duration of hyperlipidemia: chronic Cholesterol medication side effects: no Cholesterol supplements: none Medication compliance: good compliance Aspirin: yes Recent stressors: no Recurrent headaches: no Visual changes: no Palpitations: no Dyspnea: no Chest pain: no Lower extremity edema: no Dizzy/lightheaded: no   ENDOMETRIAL CANCER: History of endometrial cancer (grade 1 endometrial cancer with focal polyp) in June  2019, a complete hysterectomy was performed.  No chemo or radiation.  She continues to be followed by GYN, last visit 03/26/19.  Reports overall doing well.  Denies any symptoms.  The patient does not have a history of falls. I did not complete a risk assessment for falls. A plan of care for falls was not documented.  Relevant past medical, surgical, family and social history reviewed and updated as indicated. Interim medical history since our last visit reviewed. Allergies and medications reviewed and updated.  Review of Systems  Constitutional: Negative for activity change, appetite change, diaphoresis, fatigue and fever.  Respiratory: Negative for cough, chest tightness and shortness of breath.   Cardiovascular: Negative for chest pain, palpitations and leg swelling.  Gastrointestinal: Negative.   Endocrine: Negative for cold intolerance, heat intolerance, polydipsia, polyphagia and polyuria.  Neurological: Negative.   Psychiatric/Behavioral: Negative.     Per HPI unless specifically indicated above     Objective:    BP 130/78 (BP Location: Left Arm)   Pulse 81   Temp 98.3 F (36.8 C) (Oral)   Ht 5' 0.2" (1.529 m)   Wt 173 lb (78.5 kg)   LMP  (LMP Unknown)   SpO2 98%   BMI 33.56 kg/m   Wt Readings from Last 3 Encounters:  02/25/20 173 lb (78.5 kg)  02/09/20 167 lb 8 oz (76 kg)  03/26/19 183 lb (83 kg)    Physical Exam Vitals and nursing note reviewed.  Constitutional:      General: She is awake. She is not in acute distress. **Note De-Identified Mcdermott Obfuscation** Appearance: She is well-developed and well-groomed. She is obese. She is not ill-appearing.  HENT:     Head: Normocephalic.     Right Ear: Hearing normal.     Left Ear: Hearing normal.  Eyes:     General: Lids are normal.        Right eye: No discharge.        Left eye: No discharge.     Conjunctiva/sclera: Conjunctivae normal.     Pupils: Pupils are equal, round, and reactive to light.  Neck:     Thyroid: No thyromegaly.     Vascular:  No carotid bruit.  Cardiovascular:     Rate and Rhythm: Normal rate and regular rhythm.     Heart sounds: Normal heart sounds. No murmur heard.  No gallop.   Pulmonary:     Effort: Pulmonary effort is normal. No accessory muscle usage or respiratory distress.     Breath sounds: Normal breath sounds.  Abdominal:     General: Bowel sounds are normal.     Palpations: Abdomen is soft.  Musculoskeletal:     Cervical back: Normal range of motion and neck supple.     Right lower leg: No edema.     Left lower leg: No edema.  Skin:    General: Skin is warm and dry.  Neurological:     Mental Status: She is alert and oriented to person, place, and time.  Psychiatric:        Attention and Perception: Attention normal.        Mood and Affect: Mood normal.        Speech: Speech normal.        Behavior: Behavior normal. Behavior is cooperative.        Thought Content: Thought content normal.     Results for orders placed or performed in visit on 06/13/18  CBC with Differential/Platelet  Result Value Ref Range   WBC 7.5 3.4 - 10.8 x10E3/uL   RBC 4.65 3.77 - 5.28 x10E6/uL   Hemoglobin 14.7 11.1 - 15.9 g/dL   Hematocrit 43.5 34.0 - 46.6 %   MCV 94 79 - 97 fL   MCH 31.6 26.6 - 33.0 pg   MCHC 33.8 31 - 35 g/dL   RDW 12.1 (L) 12.3 - 15.4 %   Platelets 292 150 - 450 x10E3/uL   Neutrophils 56 Not Estab. %   Lymphs 34 Not Estab. %   Monocytes 8 Not Estab. %   Eos 1 Not Estab. %   Basos 1 Not Estab. %   Neutrophils Absolute 4.3 1 - 7 x10E3/uL   Lymphocytes Absolute 2.5 0 - 3 x10E3/uL   Monocytes Absolute 0.6 0 - 0 x10E3/uL   EOS (ABSOLUTE) 0.1 0.0 - 0.4 x10E3/uL   Basophils Absolute 0.1 0 - 0 x10E3/uL   Immature Granulocytes 0 Not Estab. %   Immature Grans (Abs) 0.0 0.0 - 0.1 x10E3/uL  Comprehensive metabolic panel  Result Value Ref Range   Glucose 155 (H) 65 - 99 mg/dL   BUN 10 8 - 27 mg/dL   Creatinine, Ser 0.87 0.57 - 1.00 mg/dL   GFR calc non Af Amer 68 >59 mL/min/1.73   GFR calc  Af Amer 79 >59 mL/min/1.73   BUN/Creatinine Ratio 11 (L) 12 - 28   Sodium 142 134 - 144 mmol/L   Potassium 4.4 3.5 - 5.2 mmol/L   Chloride 103 96 - 106 mmol/L   CO2 19 (L) 20 - 29 mmol/L   Calcium **Note De-Identified Kingsberry Obfuscation** 9.8 8.7 - 10.3 mg/dL   Total Protein 7.2 6.0 - 8.5 g/dL   Albumin 4.4 3.6 - 4.8 g/dL   Globulin, Total 2.8 1.5 - 4.5 g/dL   Albumin/Globulin Ratio 1.6 1.2 - 2.2   Bilirubin Total 0.6 0.0 - 1.2 mg/dL   Alkaline Phosphatase 84 39 - 117 IU/L   AST 51 (H) 0 - 40 IU/L   ALT 48 (H) 0 - 32 IU/L  TSH  Result Value Ref Range   TSH 2.850 0.450 - 4.500 uIU/mL  Lipid Profile  Result Value Ref Range   Cholesterol, Total 205 (H) 100 - 199 mg/dL   Triglycerides 284 (H) 0 - 149 mg/dL   HDL 37 (L) >39 mg/dL   VLDL Cholesterol Cal 57 (H) 5 - 40 mg/dL   LDL Calculated 111 (H) 0 - 99 mg/dL   Chol/HDL Ratio 5.5 (H) 0.0 - 4.4 ratio  Urine Microalbumin w/creat. ratio  Result Value Ref Range   Creatinine, Urine 290.6 Not Estab. mg/dL   Microalbumin, Urine 26.0 Not Estab. ug/mL   Microalb/Creat Ratio 8.9 0.0 - 30.0 mg/g creat  HgB A1c  Result Value Ref Range   Hgb A1c MFr Bld 7.6 (H) 4.8 - 5.6 %   Est. average glucose Bld gHb Est-mCnc 171 mg/dL      Assessment & Plan:   Problem List Items Addressed This Visit      Cardiovascular and Mediastinum   Hypertension associated with diabetes ([place])    Chronic, ongoing.  BP slightly elevated initially, but improved on recheck (has anxiety at provider offices).  Occasional elevations at home.  Urine ALB 80 today.  Discussed with her changing to Losartan and stopping Spironolactone, she has never taken any other BP medication.  She would like to try this for kidney protection.  Will discontinue Spironolactone, start Losartan 25 MG daily and adjust as needed to meet BP goal <130/80.  She is to notify provider if ADR present, including cough, educated her on this.  Continue to monitor BP at home daily and document + focus on DASH diet.  CMP and TSH today.  Return in 4  weeks, may be virtual.      Relevant Medications   losartan (COZAAR) 25 MG tablet   atorvastatin (LIPITOR) 10 MG tablet   Other Relevant Orders   Bayer DCA Hb A1c Waived   Microalbumin, Urine Waived   Comprehensive metabolic panel   TSH     Endocrine   Hyperlipidemia associated with type 2 diabetes mellitus (HCC)    Chronic, ongoing.  Continue current medication regimen and adjust as needed.  Lipid panel today.      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  Will continue diet focus at this time, as she would prefer not to start medication, but is willing to if elevation presents.  Starting Losartan for kidney protection due to urine ALB 80.  Continue to monitor BS at home daily and document + focus on diet.  Return in 3 months.      Relevant Medications   losartan (COZAAR) 25 MG tablet   atorvastatin (LIPITOR) 10 MG tablet   Other Relevant Orders   Bayer DCA Hb A1c Waived **Note De-Identified Buesing Obfuscation** Microalbumin, Urine Waived   Comprehensive metabolic panel     Other   Morbid obesity (HCC)    Recommended eating smaller high protein, low fat meals more frequently and exercising 30 mins a day 5 times a week with a goal of 10-15lb weight loss in the next 3 months. Patient voiced their understanding and motivation to adhere to these recommendations.       History of endometrial cancer    Stable, continue collaboration with GYN.       Other Visit Diagnoses    Encounter for screening mammogram for malignant neoplasm of breast       Mammogram ordered   Relevant Orders   MM DIGITAL SCREENING BILATERAL       Follow up plan: Return in about 4 weeks (around 03/24/2020) for HTN -- may be virtual.

## 2020-02-25 NOTE — Assessment & Plan Note (Addendum)
**Note De-Identified Korte Obfuscation** Chronic, ongoing.  BP slightly elevated initially, but improved on recheck (has anxiety at provider offices).  Occasional elevations at home.  Urine ALB 80 today.  Discussed with her changing to Losartan and stopping Spironolactone, she has never taken any other BP medication.  She would like to try this for kidney protection.  Will discontinue Spironolactone, start Losartan 25 MG daily and adjust as needed to meet BP goal <130/80.  She is to notify provider if ADR present, including cough, educated her on this.  Continue to monitor BP at home daily and document + focus on DASH diet.  CMP and TSH today.  Return in 4 weeks, may be virtual.

## 2020-02-25 NOTE — Assessment & Plan Note (Signed)
**Note De-Identified Hutmacher Obfuscation** Chronic, ongoing with A1C downward trend today at 6.8%.  Will continue diet focus at this time, as she would prefer not to start medication, but is willing to if elevation presents.  Starting Losartan for kidney protection due to urine ALB 80.  Continue to monitor BS at home daily and document + focus on diet.  Return in 3 months.

## 2020-02-25 NOTE — Assessment & Plan Note (Signed)
**Note De-identified Supple Obfuscation** Recommended eating smaller high protein, low fat meals more frequently and exercising 30 mins a day 5 times a week with a goal of 10-15lb weight loss in the next 3 months. Patient voiced their understanding and motivation to adhere to these recommendations.  

## 2020-02-25 NOTE — Assessment & Plan Note (Signed)
**Note De-identified Jaber Obfuscation** Chronic, ongoing.  Continue current medication regimen and adjust as needed. Lipid panel today. 

## 2020-02-25 NOTE — Patient Instructions (Signed)
**Note De-identified Dumler Obfuscation** [facility]  °Address: [address], [place],  [address]  °Phone: [phone] ° ° °DASH Eating Plan °DASH stands for "Dietary Approaches to Stop Hypertension." The DASH eating plan is a healthy eating plan that has been shown to reduce high blood pressure (hypertension). It may also reduce your risk for type 2 diabetes, heart disease, and stroke. The DASH eating plan may also help with weight loss. °What are tips for following this plan? ° °General guidelines °· Avoid eating more than 2,300 mg (milligrams) of salt (sodium) a day. If you have hypertension, you may need to reduce your sodium intake to 1,500 mg a day. °· Limit alcohol intake to no more than 1 drink a day for nonpregnant women and 2 drinks a day for men. One drink equals 12 oz of beer, 5 oz of wine, or 1½ oz of hard liquor. °· Work with your health care provider to maintain a healthy body weight or to lose weight. Ask what an ideal weight is for you. °· Get at least 30 minutes of exercise that causes your heart to beat faster (aerobic exercise) most days of the week. Activities may include walking, swimming, or biking. °· Work with your health care provider or diet and nutrition specialist (dietitian) to adjust your eating plan to your individual calorie needs. °Reading food labels ° °· Check food labels for the amount of sodium per serving. Choose foods with less than 5 percent of the Daily Value of sodium. Generally, foods with less than 300 mg of sodium per serving fit into this eating plan. °· To find whole grains, look for the word "whole" as the first word in the ingredient list. °Shopping °· Buy products labeled as "low-sodium" or "no salt added." °· Buy fresh foods. Avoid canned foods and premade or frozen meals. °Cooking °· Avoid adding salt when cooking. Use salt-free seasonings or herbs instead of table salt or sea salt. Check with your health care provider or pharmacist before using salt  substitutes. °· Do not fry foods. Cook foods using healthy methods such as baking, boiling, grilling, and broiling instead. °· Cook with heart-healthy oils, such as olive, canola, soybean, or sunflower oil. °Meal planning °· Eat a balanced diet that includes: °? 5 or more servings of fruits and vegetables each day. At each meal, try to fill half of your plate with fruits and vegetables. °? Up to 6-8 servings of whole grains each day. °? Less than 6 oz of lean meat, poultry, or fish each day. A 3-oz serving of meat is about the same size as a deck of cards. One egg equals 1 oz. °? 2 servings of low-fat dairy each day. °? A serving of nuts, seeds, or beans 5 times each week. °? Heart-healthy fats. Healthy fats called Omega-3 fatty acids are found in foods such as flaxseeds and coldwater fish, like sardines, salmon, and mackerel. °· Limit how much you eat of the following: °? Canned or prepackaged foods. °? Food that is high in trans fat, such as fried foods. °? Food that is high in saturated fat, such as fatty meat. °? Sweets, desserts, sugary drinks, and other foods with added sugar. °? Full-fat dairy products. °· Do not salt foods before eating. °· Try to eat at least 2 vegetarian meals each week. °· Eat more home-cooked food and less restaurant, buffet, and fast food. °· When eating at a restaurant, ask that your food be prepared with less salt or  **Note De-identified Monaco Obfuscation** no salt, if possible. °What foods are recommended? °The items listed may not be a complete list. Talk with your dietitian about what dietary choices are best for you. °Grains °Whole-grain or whole-wheat bread. Whole-grain or whole-wheat pasta. Brown rice. Oatmeal. Quinoa. Bulgur. Whole-grain and low-sodium cereals. Pita bread. Low-fat, low-sodium crackers. Whole-wheat flour tortillas. °Vegetables °Fresh or frozen vegetables (raw, steamed, roasted, or grilled). Low-sodium or reduced-sodium tomato and vegetable juice. Low-sodium or reduced-sodium tomato sauce and tomato  paste. Low-sodium or reduced-sodium canned vegetables. °Fruits °All fresh, dried, or frozen fruit. Canned fruit in natural juice (without added sugar). °Meat and other protein foods °Skinless chicken or turkey. Ground chicken or turkey. Pork with fat trimmed off. Fish and seafood. Egg whites. Dried beans, peas, or lentils. Unsalted nuts, nut butters, and seeds. Unsalted canned beans. Lean cuts of beef with fat trimmed off. Low-sodium, lean deli meat. °Dairy °Low-fat (1%) or fat-free (skim) milk. Fat-free, low-fat, or reduced-fat cheeses. Nonfat, low-sodium ricotta or cottage cheese. Low-fat or nonfat yogurt. Low-fat, low-sodium cheese. °Fats and oils °Soft margarine without trans fats. Vegetable oil. Low-fat, reduced-fat, or light mayonnaise and salad dressings (reduced-sodium). Canola, safflower, olive, soybean, and sunflower oils. Avocado. °Seasoning and other foods °Herbs. Spices. Seasoning mixes without salt. Unsalted popcorn and pretzels. Fat-free sweets. °What foods are not recommended? °The items listed may not be a complete list. Talk with your dietitian about what dietary choices are best for you. °Grains °Baked goods made with fat, such as croissants, muffins, or some breads. Dry pasta or rice meal packs. °Vegetables °Creamed or fried vegetables. Vegetables in a cheese sauce. Regular canned vegetables (not low-sodium or reduced-sodium). Regular canned tomato sauce and paste (not low-sodium or reduced-sodium). Regular tomato and vegetable juice (not low-sodium or reduced-sodium). Pickles. Olives. °Fruits °Canned fruit in a light or heavy syrup. Fried fruit. Fruit in cream or butter sauce. °Meat and other protein foods °Fatty cuts of meat. Ribs. Fried meat. Bacon. Sausage. Bologna and other processed lunch meats. Salami. Fatback. Hotdogs. Bratwurst. Salted nuts and seeds. Canned beans with added salt. Canned or smoked fish. Whole eggs or egg yolks. Chicken or turkey with skin. °Dairy °Whole or 2% milk,  cream, and half-and-half. Whole or full-fat cream cheese. Whole-fat or sweetened yogurt. Full-fat cheese. Nondairy creamers. Whipped toppings. Processed cheese and cheese spreads. °Fats and oils °Butter. Stick margarine. Lard. Shortening. Ghee. Bacon fat. Tropical oils, such as coconut, palm kernel, or palm oil. °Seasoning and other foods °Salted popcorn and pretzels. Onion salt, garlic salt, seasoned salt, table salt, and sea salt. Worcestershire sauce. Tartar sauce. Barbecue sauce. Teriyaki sauce. Soy sauce, including reduced-sodium. Steak sauce. Canned and packaged gravies. Fish sauce. Oyster sauce. Cocktail sauce. Horseradish that you find on the shelf. Ketchup. Mustard. Meat flavorings and tenderizers. Bouillon cubes. Hot sauce and Tabasco sauce. Premade or packaged marinades. Premade or packaged taco seasonings. Relishes. Regular salad dressings. °Where to find more information: °· National Heart, Lung, and Blood Institute: www.nhlbi.nih.gov °· American Heart Association: www.heart.org °Summary °· The DASH eating plan is a healthy eating plan that has been shown to reduce high blood pressure (hypertension). It may also reduce your risk for type 2 diabetes, heart disease, and stroke. °· With the DASH eating plan, you should limit salt (sodium) intake to 2,300 mg a day. If you have hypertension, you may need to reduce your sodium intake to 1,500 mg a day. °· When on the DASH eating plan, aim to eat more fresh fruits and vegetables, whole grains, lean proteins, low-fat dairy, and  **Note De-identified Donaghey Obfuscation** heart-healthy fats. °· Work with your health care provider or diet and nutrition specialist (dietitian) to adjust your eating plan to your individual calorie needs. °This information is not intended to replace advice given to you by your health care provider. Make sure you discuss any questions you have with your health care provider. °Document Revised: 07/13/2017 Document Reviewed: 07/24/2016 °Elsevier Patient Education © 2020 Elsevier  Inc. ° °

## 2020-02-25 NOTE — Assessment & Plan Note (Signed)
**Note De-identified Roeper Obfuscation** Stable, continue collaboration with GYN. °

## 2020-02-26 ENCOUNTER — Other Ambulatory Visit: Payer: Self-pay | Admitting: Nurse Practitioner

## 2020-02-26 DIAGNOSIS — E785 Hyperlipidemia, unspecified: Secondary | ICD-10-CM

## 2020-02-26 LAB — COMPREHENSIVE METABOLIC PANEL
ALT: 34 IU/L — ABNORMAL HIGH (ref 0–32)
AST: 35 IU/L (ref 0–40)
Albumin/Globulin Ratio: 1.6 (ref 1.2–2.2)
Albumin: 4.9 g/dL — ABNORMAL HIGH (ref 3.8–4.8)
Alkaline Phosphatase: 83 IU/L (ref 48–121)
BUN/Creatinine Ratio: 13 (ref 12–28)
BUN: 12 mg/dL (ref 8–27)
Bilirubin Total: 0.7 mg/dL (ref 0.0–1.2)
CO2: 24 mmol/L (ref 20–29)
Calcium: 10 mg/dL (ref 8.7–10.3)
Chloride: 100 mmol/L (ref 96–106)
Creatinine, Ser: 0.9 mg/dL (ref 0.57–1.00)
GFR calc Af Amer: 75 mL/min/{1.73_m2} (ref >59)
GFR calc non Af Amer: 65 mL/min/{1.73_m2} (ref >59)
Globulin, Total: 3.1 g/dL (ref 1.5–4.5)
Glucose: 133 mg/dL — ABNORMAL HIGH (ref 65–99)
Potassium: 4.9 mmol/L (ref 3.5–5.2)
Sodium: 139 mmol/L (ref 134–144)
Total Protein: 8 g/dL (ref 6.0–8.5)

## 2020-02-26 LAB — LIPID PANEL W/O CHOL/HDL RATIO
Cholesterol, Total: 211 mg/dL — ABNORMAL HIGH (ref 100–199)
HDL: 32 mg/dL — ABNORMAL LOW (ref >39)
LDL Chol Calc (NIH): 123 mg/dL — ABNORMAL HIGH (ref 0–99)
Triglycerides: 318 mg/dL — ABNORMAL HIGH (ref 0–149)
VLDL Cholesterol Cal: 56 mg/dL — ABNORMAL HIGH (ref 5–40)

## 2020-02-26 LAB — TSH: TSH: 1.8 u[IU]/mL (ref 0.450–4.500)

## 2020-02-26 MED ORDER — ATORVASTATIN CALCIUM 20 MG PO TABS
20.0000 mg | ORAL_TABLET | Freq: Every day | ORAL | 3 refills | Status: DC
Start: 2020-02-26 — End: 2020-03-29

## 2020-02-26 NOTE — Progress Notes (Signed)
**Note De-identified Bade Obfuscation** Atorvastatin increase to 20 MG 

## 2020-02-26 NOTE — Progress Notes (Signed)
**Note De-Identified Browe Obfuscation** Please let Suzanne Jennings know her labs have returned: - Kidney function remains stable, liver function shows very mild elevation in ALT which we will continue to monitor. - thyroid test is in normal range - Cholesterol levels remain elevated above goal.  Goal LDL is <70 for stroke prevention and her level is 123.  Are you taking Atorvastatin 10 MG daily?  If so I would recommend we go up on this to 20 MG and then recheck fasting lipid panel [facility] outpatient labs in 6 weeks.  If you are agreeable to this let me know and I will send this increase in.  Any questions? Have a great day!!

## 2020-03-24 ENCOUNTER — Ambulatory Visit: Payer: Medicare Other

## 2020-03-25 ENCOUNTER — Encounter: Payer: Self-pay | Admitting: Nurse Practitioner

## 2020-03-25 ENCOUNTER — Ambulatory Visit
Admission: RE | Admit: 2020-03-25 | Discharge: 2020-03-25 | Disposition: A | Discharge status: Home / Self Care | Payer: Medicare Other | Source: Ambulatory Visit | Attending: Nurse Practitioner | Admitting: Nurse Practitioner

## 2020-03-25 ENCOUNTER — Other Ambulatory Visit: Payer: Self-pay

## 2020-03-25 DIAGNOSIS — Z1231 Encounter for screening mammogram for malignant neoplasm of breast: Secondary | ICD-10-CM | POA: Diagnosis not present

## 2020-03-25 DIAGNOSIS — I700 Atherosclerosis of aorta: Secondary | ICD-10-CM | POA: Insufficient documentation

## 2020-03-29 ENCOUNTER — Ambulatory Visit (INDEPENDENT_AMBULATORY_CARE_PROVIDER_SITE_OTHER): Payer: Medicare Other | Admitting: Nurse Practitioner

## 2020-03-29 ENCOUNTER — Encounter: Payer: Self-pay | Admitting: Nurse Practitioner

## 2020-03-29 ENCOUNTER — Other Ambulatory Visit: Payer: Self-pay

## 2020-03-29 VITALS — BP 128/72 | HR 74 | Temp 98.2°F | Wt 177.0 lb

## 2020-03-29 DIAGNOSIS — I700 Atherosclerosis of aorta: Secondary | ICD-10-CM

## 2020-03-29 DIAGNOSIS — I10 Essential (primary) hypertension: Secondary | ICD-10-CM

## 2020-03-29 DIAGNOSIS — I152 Hypertension secondary to endocrine disorders: Secondary | ICD-10-CM

## 2020-03-29 DIAGNOSIS — E1159 Type 2 diabetes mellitus with other circulatory complications: Secondary | ICD-10-CM

## 2020-03-29 DIAGNOSIS — M79601 Pain in right arm: Secondary | ICD-10-CM | POA: Diagnosis not present

## 2020-03-29 MED ORDER — ATORVASTATIN CALCIUM 20 MG PO TABS: 20.0000 mg | ORAL_TABLET | Freq: Every day | ORAL | 3 refills | Status: DC

## 2020-03-29 MED ORDER — TIZANIDINE HCL 4 MG PO TABS
4.0000 mg | ORAL_TABLET | Freq: Four times a day (QID) | ORAL | 0 refills | Status: DC | PRN
Start: 2020-03-29 — End: 2020-05-31

## 2020-03-29 NOTE — Assessment & Plan Note (Signed)
**Note De-Identified Diffee Obfuscation** Acute, suspect pinched nerve.  Will defer imaging at this time.  Recommend she reduce Aleeve use and instead may use Voltaren gel as needed + will send in script for Tizanidine to use as needed, educated her on this.  May minimally use Tylenol and Aleeve, only if needed.  Alternate heat and ice as needed and perform gentle stretching.  Return to office for worsening or ongoing pain.

## 2020-03-29 NOTE — Assessment & Plan Note (Signed)
**Note De-Identified Schum Obfuscation** Chronic, ongoing with initial BP elevated, but repeat at goal and similar to her home BPs which are at goal.  Discussed with her goal BP <130/80.  Continue Losartan at this time and adjust dose as needed, is tolerating well without ADR -- offers kidney protection with her diabetes.  Recommend she continue to monitor BP at home daily and document for provider + focus on DASH diet.  CMP today.  Return to office in 2 months.

## 2020-03-29 NOTE — Patient Instructions (Signed)
**Note De-Identified Leija Obfuscation** VOLTAREN GEL OVER THE COUNTER FOR PAIN   Shoulder Pain Many things can cause shoulder pain, including:  An injury.  Moving the shoulder in the same way again and again (overuse).  Joint pain (arthritis). Pain can come from:  Swelling and irritation (inflammation) of any part of the shoulder.  An injury to the shoulder joint.  An injury to: ? Tissues that connect muscle to bone (tendons). ? Tissues that connect bones to each other (ligaments). ? Bones. Follow these instructions at home: Watch for changes in your symptoms. Let your doctor know about them. Follow these instructions to help with your pain. If you have a sling:  Wear the sling as told by your doctor. Remove it only as told by your doctor.  Loosen the sling if your fingers: ? Tingle. ? Become numb. ? Turn cold and blue.  Keep the sling clean.  If the sling is not waterproof: ? Do not let it get wet. ? Take the sling off when you shower or bathe. Managing pain, stiffness, and swelling   If told, put ice on the painful area: ? Put ice in a plastic bag. ? Place a towel between your skin and the bag. ? Leave the ice on for 20 minutes, 2-3 times a day. Stop putting ice on if it does not help with the pain.  Squeeze a soft ball or a foam pad as much as possible. This prevents swelling in the shoulder. It also helps to strengthen the arm. General instructions  Take over-the-counter and prescription medicines only as told by your doctor.  Keep all follow-up visits as told by your doctor. This is important. Contact a doctor if:  Your pain gets worse.  Medicine does not help your pain.  You have new pain in your arm, hand, or fingers. Get help right away if:  Your arm, hand, or fingers: ? Tingle. ? Are numb. ? Are swollen. ? Are painful. ? Turn white or blue. Summary  Shoulder pain can be caused by many things. These include injury, moving the shoulder in the same away again and again, and joint  pain.  Watch for changes in your symptoms. Let your doctor know about them.  This condition may be treated with a sling, ice, and pain medicine.  Contact your doctor if the pain gets worse or you have new pain. Get help right away if your arm, hand, or fingers tingle or get numb, swollen, or painful.  Keep all follow-up visits as told by your doctor. This is important. This information is not intended to replace advice given to you by your health care provider. Make sure you discuss any questions you have with your health care provider. Document Revised: 02/12/2018 Document Reviewed: 02/12/2018 Elsevier Patient Education  [employer].

## 2020-03-29 NOTE — Progress Notes (Signed)
**Note De-Identified Deeley Obfuscation** BP 128/72 (BP Location: Left Arm)   Pulse 74   Temp 98.2 F (36.8 C) (Oral)   Wt 177 lb (80.3 kg)   LMP  (LMP Unknown)   SpO2 97%   BMI 34.34 kg/m    Subjective:    Patient ID: Suzanne Jennings, female    DOB: November 04, 1950, 69 y.o.   MRN: [id number]  HPI: Suzanne Jennings is a 69 y.o. female  Chief Complaint  Patient presents with  . Hypertension   HYPERTENSION Switched to Losartan for kidney protection in T2DM and stopped Spironolactone last visit.  Reports no ADR with change in medication and tolerating well.  She did have aortic atherosclerosis noted on a CT 06/03/18 -- continues on statin therapy and ASA. Hypertension status: stable  Satisfied with current treatment? yes Duration of hypertension: chronic BP monitoring frequency:  daily BP range: 127/70 yesterday  -- on average 120-130/70-80 BP medication side effects:  no Medication compliance: good compliance Aspirin: yes Recurrent headaches: no Visual changes: no Palpitations: no Dyspnea: no Chest pain: no Lower extremity edema: no Dizzy/lightheaded: no  RIGHT ARM PAIN Has been a week since initially had pain, was improving then went for mammogram and they moved arm around and she feels they irritated this again.  Pain starts up in shoulder/bicep.  Right hand dominant. Duration: weeks Location: shoulder and bicep Mechanism of injury: unknown Onset: sudden Severity: 9/10 at worst Quality: throbbing, aching, numbness down arm Frequency: intermittent Radiation: yes Aggravating factors: movement -- lifting arm up above head Alleviating factors: NSAIDs and rest  Status: stable Treatments attempted: rest and aleve  Relief with NSAIDs?:  moderate Swelling: no Redness: no  Warmth: no Trauma: no Chest pain: no  Shortness of breath: no  Fever: no Decreased sensation: no Paresthesias: no Weakness: no   Relevant past medical, surgical, family and social history reviewed and updated as indicated. Interim medical  history since our last visit reviewed. Allergies and medications reviewed and updated.  Review of Systems  Constitutional: Negative for activity change, appetite change, diaphoresis, fatigue and fever.  Respiratory: Negative for cough, chest tightness and shortness of breath.   Cardiovascular: Negative for chest pain, palpitations and leg swelling.  Gastrointestinal: Negative.   Endocrine: Negative for cold intolerance, heat intolerance, polydipsia, polyphagia and polyuria.  Musculoskeletal: Positive for arthralgias.  Neurological: Negative.   Psychiatric/Behavioral: Negative.     Per HPI unless specifically indicated above     Objective:    BP 128/72 (BP Location: Left Arm)   Pulse 74   Temp 98.2 F (36.8 C) (Oral)   Wt 177 lb (80.3 kg)   LMP  (LMP Unknown)   SpO2 97%   BMI 34.34 kg/m   Wt Readings from Last 3 Encounters:  03/29/20 177 lb (80.3 kg)  02/25/20 173 lb (78.5 kg)  02/09/20 167 lb 8 oz (76 kg)    Physical Exam Vitals and nursing note reviewed.  Constitutional:      General: She is awake. She is not in acute distress.    Appearance: She is well-developed and well-groomed. She is obese. She is not ill-appearing.  HENT:     Head: Normocephalic.     Right Ear: Hearing normal.     Left Ear: Hearing normal.  Eyes:     General: Lids are normal.        Right eye: No discharge.        Left eye: No discharge.     Conjunctiva/sclera: Conjunctivae normal. **Note De-Identified Haring Obfuscation** Pupils: Pupils are equal, round, and reactive to light.  Neck:     Thyroid: No thyromegaly.     Vascular: No carotid bruit.  Cardiovascular:     Rate and Rhythm: Normal rate and regular rhythm.     Heart sounds: Normal heart sounds. No murmur heard.  No gallop.   Pulmonary:     Effort: Pulmonary effort is normal. No accessory muscle usage or respiratory distress.     Breath sounds: Normal breath sounds.  Abdominal:     General: Bowel sounds are normal.     Palpations: Abdomen is soft.    Musculoskeletal:     Right shoulder: Tenderness present. No swelling, deformity, laceration or crepitus. Normal range of motion. Normal strength.     Left shoulder: Normal.     Right upper arm: No swelling, edema, lacerations or tenderness.     Left upper arm: Normal.     Cervical back: Normal range of motion and neck supple.     Right lower leg: No edema.     Left lower leg: No edema.     Comments: Ful ROM BUE, with mild tenderness reported with elevation of right arm above head.  No tenderness to any other aspects of ROM.  No rashes noted.  Skin:    General: Skin is warm and dry.  Neurological:     Mental Status: She is alert and oriented to person, place, and time.  Psychiatric:        Attention and Perception: Attention normal.        Mood and Affect: Mood normal.        Speech: Speech normal.        Behavior: Behavior normal. Behavior is cooperative.        Thought Content: Thought content normal.     Results for orders placed or performed in visit on 02/25/20  Bayer DCA Hb A1c Waived  Result Value Ref Range   HB A1C (BAYER DCA - WAIVED) 6.8 <7.0 %  Microalbumin, Urine Waived  Result Value Ref Range   Microalb, Ur Waived 80 (H) 0 - 19 mg/L   Creatinine, Urine Waived 100 10 - 300 mg/dL   Microalb/Creat Ratio <30 <30 mg/g  Comprehensive metabolic panel  Result Value Ref Range   Glucose 133 (H) 65 - 99 mg/dL   BUN 12 8 - 27 mg/dL   Creatinine, Ser 0.90 0.57 - 1.00 mg/dL   GFR calc non Af Amer 65 >59 mL/min/1.73   GFR calc Af Amer 75 >59 mL/min/1.73   BUN/Creatinine Ratio 13 12 - 28   Sodium 139 134 - 144 mmol/L   Potassium 4.9 3.5 - 5.2 mmol/L   Chloride 100 96 - 106 mmol/L   CO2 24 20 - 29 mmol/L   Calcium 10.0 8.7 - 10.3 mg/dL   Total Protein 8.0 6.0 - 8.5 g/dL   Albumin 4.9 (H) 3.8 - 4.8 g/dL   Globulin, Total 3.1 1.5 - 4.5 g/dL   Albumin/Globulin Ratio 1.6 1.2 - 2.2   Bilirubin Total 0.7 0.0 - 1.2 mg/dL   Alkaline Phosphatase 83 48 - 121 IU/L   AST 35 0 - 40  IU/L   ALT 34 (H) 0 - 32 IU/L  TSH  Result Value Ref Range   TSH 1.800 0.450 - 4.500 uIU/mL  Lipid Panel w/o Chol/HDL Ratio  Result Value Ref Range   Cholesterol, Total 211 (H) 100 - 199 mg/dL   Triglycerides 318 (H) 0 - 149 mg/dL **Note De-Identified Xin Obfuscation** HDL 32 (L) >39 mg/dL   VLDL Cholesterol Cal 56 (H) 5 - 40 mg/dL   LDL Chol Calc (NIH) 123 (H) 0 - 99 mg/dL      Assessment & Plan:   Problem List Items Addressed This Visit      Cardiovascular and Mediastinum   Hypertension associated with diabetes ([place]) - Primary    Chronic, ongoing with initial BP elevated, but repeat at goal and similar to her home BPs which are at goal.  Discussed with her goal BP <130/80.  Continue Losartan at this time and adjust dose as needed, is tolerating well without ADR -- offers kidney protection with her diabetes.  Recommend she continue to monitor BP at home daily and document for provider + focus on DASH diet.  CMP today.  Return to office in 2 months.      Relevant Medications   atorvastatin (LIPITOR) 20 MG tablet   Other Relevant Orders   Comprehensive metabolic panel   Aortic atherosclerosis ([place])    Noted on CT 06/03/18.  Continue daily statin and ASA for prevention.      Relevant Medications   atorvastatin (LIPITOR) 20 MG tablet     Other   Right arm pain    Acute, suspect pinched nerve.  Will defer imaging at this time.  Recommend she reduce Aleeve use and instead may use Voltaren gel as needed + will send in script for Tizanidine to use as needed, educated her on this.  May minimally use Tylenol and Aleeve, only if needed.  Alternate heat and ice as needed and perform gentle stretching.  Return to office for worsening or ongoing pain.          Follow up plan: Return in about 2 months (around 05/29/2020) for T2DM, HTN/HLD.

## 2020-03-29 NOTE — Assessment & Plan Note (Signed)
**Note De-Identified Chamberlain Obfuscation** Noted on CT 06/03/18.  Continue daily statin and ASA for prevention.

## 2020-03-30 ENCOUNTER — Telehealth: Payer: Self-pay | Admitting: Nurse Practitioner

## 2020-03-30 ENCOUNTER — Telehealth: Payer: Self-pay | Admitting: Obstetrics and Gynecology

## 2020-03-30 LAB — COMPREHENSIVE METABOLIC PANEL
ALT: 42 IU/L — ABNORMAL HIGH (ref 0–32)
AST: 41 IU/L — ABNORMAL HIGH (ref 0–40)
Albumin/Globulin Ratio: 1.5 (ref 1.2–2.2)
Albumin: 4.6 g/dL (ref 3.8–4.8)
Alkaline Phosphatase: 85 IU/L (ref 48–121)
BUN/Creatinine Ratio: 19 (ref 12–28)
BUN: 15 mg/dL (ref 8–27)
Bilirubin Total: 0.4 mg/dL (ref 0.0–1.2)
CO2: 24 mmol/L (ref 20–29)
Calcium: 9.6 mg/dL (ref 8.7–10.3)
Chloride: 103 mmol/L (ref 96–106)
Creatinine, Ser: 0.79 mg/dL (ref 0.57–1.00)
GFR calc Af Amer: 88 mL/min/{1.73_m2} (ref >59)
GFR calc non Af Amer: 76 mL/min/{1.73_m2} (ref >59)
Globulin, Total: 3.1 g/dL (ref 1.5–4.5)
Glucose: 110 mg/dL — ABNORMAL HIGH (ref 65–99)
Potassium: 4.6 mmol/L (ref 3.5–5.2)
Sodium: 142 mmol/L (ref 134–144)
Total Protein: 7.7 g/dL (ref 6.0–8.5)

## 2020-03-30 NOTE — Progress Notes (Signed)
**Note De-Identified Everage Obfuscation** Good morning, please let Suzanne Jennings know her labs have returned and kidney function remains stable on blood work.  Liver function labs continue to have very mild elevation, which we will continue to monitor closely.  If any questions let me know.  Have a great day!!

## 2020-03-30 NOTE — Telephone Encounter (Signed)
**Note De-identified Frett Obfuscation** Patient notified of lab results

## 2020-03-30 NOTE — Telephone Encounter (Signed)
**Note De-Identified Knoche Obfuscation** Copied from [place] [phone]. Topic: General - Inquiry >> Mar 30, 2020 12:00 PM Greggory Keen D wrote: Reason for CRM: Pt returned call regarding her lab work.  Please call patient back at  [phone]

## 2020-03-30 NOTE — Telephone Encounter (Signed)
**Note De-Identified Kakar Obfuscation** Pt called to cancel her GYn follow-up for 8/18. Pt did not won't to reschedule the appt at this time. Stated she would call back at a later date.

## 2020-03-31 ENCOUNTER — Inpatient Hospital Stay: Payer: Medicare Other

## 2020-04-07 ENCOUNTER — Telehealth: Payer: Self-pay | Admitting: Nurse Practitioner

## 2020-04-07 NOTE — Telephone Encounter (Signed)
**Note De-identified Aguiniga Obfuscation** Routing to provider to advise.  

## 2020-04-07 NOTE — Telephone Encounter (Signed)
**Note De-Identified Mckenney Obfuscation** Appointment cancelled, called and left patient a VM letting her know.

## 2020-04-07 NOTE — Telephone Encounter (Signed)
**Note De-Identified Wymer Obfuscation** Copied from [place] [id number]. Topic: General - Inquiry >> Apr 07, 2020 12:03 PM Scherrie Gerlach wrote: Reason for CRM:  pt wants to know is it really necessary she come in on Monday for another lab?  Pt feels like she has had too many blood test lately, and it is too soon. If jolene wants her to she will, but doesn't want to come if not so.

## 2020-04-07 NOTE — Telephone Encounter (Signed)
**Note De-Identified North Obfuscation** We can check at her next visit, she can cancel Monday lab visit.

## 2020-04-12 ENCOUNTER — Other Ambulatory Visit: Payer: Self-pay

## 2020-05-28 ENCOUNTER — Telehealth: Payer: Self-pay

## 2020-05-28 NOTE — Telephone Encounter (Signed)
**Note De-Identified Longoria Obfuscation** Called and left voicemail with Suzanne Jennings regarding her endometrial cancer surveillance. She cancelled her appointment in August 2021 and has not rescheduled. She is also due for one year chest CT surveillance of lung nodule. I would like to arrange both of these appointments. Left contact number to return call.

## 2020-05-31 ENCOUNTER — Encounter: Payer: Self-pay | Admitting: Nurse Practitioner

## 2020-05-31 ENCOUNTER — Other Ambulatory Visit: Payer: Self-pay

## 2020-05-31 ENCOUNTER — Ambulatory Visit (INDEPENDENT_AMBULATORY_CARE_PROVIDER_SITE_OTHER): Payer: Medicare Other | Admitting: Nurse Practitioner

## 2020-05-31 DIAGNOSIS — E1169 Type 2 diabetes mellitus with other specified complication: Secondary | ICD-10-CM | POA: Diagnosis not present

## 2020-05-31 DIAGNOSIS — Z8542 Personal history of malignant neoplasm of other parts of uterus: Secondary | ICD-10-CM

## 2020-05-31 DIAGNOSIS — I700 Atherosclerosis of aorta: Secondary | ICD-10-CM | POA: Diagnosis not present

## 2020-05-31 DIAGNOSIS — E6609 Other obesity due to excess calories: Secondary | ICD-10-CM | POA: Diagnosis not present

## 2020-05-31 DIAGNOSIS — E785 Hyperlipidemia, unspecified: Secondary | ICD-10-CM

## 2020-05-31 DIAGNOSIS — Z6832 Body mass index (BMI) 32.0-32.9, adult: Secondary | ICD-10-CM

## 2020-05-31 DIAGNOSIS — E1159 Type 2 diabetes mellitus with other circulatory complications: Secondary | ICD-10-CM | POA: Diagnosis not present

## 2020-05-31 DIAGNOSIS — I152 Hypertension secondary to endocrine disorders: Secondary | ICD-10-CM

## 2020-05-31 LAB — BAYER DCA HB A1C WAIVED: HB A1C (BAYER DCA - WAIVED): 7.1 % — ABNORMAL HIGH (ref <7.0)

## 2020-05-31 NOTE — Assessment & Plan Note (Signed)
**Note De-Identified Beissel Obfuscation** Stable, continue collaboration with GYN.

## 2020-05-31 NOTE — Assessment & Plan Note (Signed)
**Note De-identified Cho Obfuscation** Recommended eating smaller high protein, low fat meals more frequently and exercising 30 mins a day 5 times a week with a goal of 10-15lb weight loss in the next 3 months. Patient voiced their understanding and motivation to adhere to these recommendations.  

## 2020-05-31 NOTE — Assessment & Plan Note (Signed)
**Note De-Identified Irizarry Obfuscation** Chronic, ongoing with A1C with upward trend today at 7.1%.  Will continue diet focus at this time, as she would prefer not to start medication, but is willing to if elevation continues to present.  Continue Losartan for kidney protection due to urine ALB 80 at recent visit.  Continue to monitor BS at home daily and document + focus on diet.  Return in 3 months.

## 2020-05-31 NOTE — Assessment & Plan Note (Signed)
**Note De-Identified Carriero Obfuscation** Noted on CT 06/03/18.  Continue daily statin and ASA for prevention.

## 2020-05-31 NOTE — Progress Notes (Signed)
**Note De-Identified Lerner Obfuscation** BP 136/74 (BP Location: Left Arm)    Pulse 70    Temp 97.6 F (36.4 C) (Oral)    Resp 16    Ht 5' (1.524 m)    Wt 167 lb (75.8 kg)    LMP  (LMP Unknown)    SpO2 98%    BMI 32.61 kg/m    Subjective:    Patient ID: Suzanne Jennings, female    DOB: 09/02/1950, 69 y.o.   MRN: [id number]  HPI: Suzanne Jennings is a 69 y.o. female  Chief Complaint  Patient presents with   Diabetes   Hypertension   DIABETES Currently diet controlled.  Last A1C in July 6.8%.  Drinks a lot of water and walks a lot.  Has never taken medication for her diabetes.  She has been focused on diet and lost 10 pounds.   Hypoglycemic episodes:no Polydipsia/polyuria: no Visual disturbance: no Chest pain: no Paresthesias: no Glucose Monitoring: yes  Accucheck frequency: every other day  Fasting glucose: last night was 100 -- stays <130  Post prandial:  Evening:  Before meals: Taking Insulin?: no  Long acting insulin:  Short acting insulin: Blood Pressure Monitoring: daily Retinal Examination: Not up to Date -- scheduled for December Foot Exam: Up to Date Pneumovax: refused Influenza: Up to Date Aspirin: yes   HYPERTENSION / HYPERLIPIDEMIA Continues on Atorvastatin, Losartan, + ASA -- did not not take medicine yet this morning.  Changed from Spironolactone to Losartan this year for kidney protection with diabetes. Has history of CT scan in 2019 noting aortic atherosclerosis. Satisfied with current treatment? yes Duration of hypertension: chronic BP monitoring frequency: daily BP range: 127/60 to 130/70 range -- never any higher BP medication side effects: no Duration of hyperlipidemia: chronic Cholesterol medication side effects: no Cholesterol supplements: none Medication compliance: good compliance Aspirin: yes Recent stressors: no Recurrent headaches: no Visual changes: no Palpitations: no Dyspnea: no Chest pain: no Lower extremity edema: no Dizzy/lightheaded: no   ENDOMETRIAL CANCER: History of  endometrial cancer (grade 1 endometrial cancer with focal polyp) in June 2019, a complete hysterectomy was performed. No chemo or radiation.  She continues to be followed by GYN, last visit 03/26/19 -- missed recent appointment scheduled with Dr. Fransisca Connors for 03/31/20.  Reports overall doing well and does not want any further internal exams.  Denies any symptoms.  Relevant past medical, surgical, family and social history reviewed and updated as indicated. Interim medical history since our last visit reviewed. Allergies and medications reviewed and updated.  Review of Systems  Constitutional: Negative for activity change, appetite change, diaphoresis, fatigue and fever.  Respiratory: Negative for cough, chest tightness and shortness of breath.   Cardiovascular: Negative for chest pain, palpitations and leg swelling.  Gastrointestinal: Negative.   Endocrine: Negative for cold intolerance, heat intolerance, polydipsia, polyphagia and polyuria.  Neurological: Negative.   Psychiatric/Behavioral: Negative.     Per HPI unless specifically indicated above     Objective:    BP 136/74 (BP Location: Left Arm)    Pulse 70    Temp 97.6 F (36.4 C) (Oral)    Resp 16    Ht 5' (1.524 m)    Wt 167 lb (75.8 kg)    LMP  (LMP Unknown)    SpO2 98%    BMI 32.61 kg/m   Wt Readings from Last 3 Encounters:  05/31/20 167 lb (75.8 kg)  03/29/20 177 lb (80.3 kg)  02/25/20 173 lb (78.5 kg)    Physical Exam Vitals **Note De-Identified Gough Obfuscation** and nursing note reviewed.  Constitutional:      General: She is awake. She is not in acute distress.    Appearance: She is well-developed and well-groomed. She is obese. She is not ill-appearing.  HENT:     Head: Normocephalic.     Right Ear: Hearing normal.     Left Ear: Hearing normal.  Eyes:     General: Lids are normal.        Right eye: No discharge.        Left eye: No discharge.     Conjunctiva/sclera: Conjunctivae normal.     Pupils: Pupils are equal, round, and reactive to light.    Neck:     Thyroid: No thyromegaly.     Vascular: No carotid bruit.  Cardiovascular:     Rate and Rhythm: Normal rate and regular rhythm.     Heart sounds: Normal heart sounds. No murmur heard.  No gallop.   Pulmonary:     Effort: Pulmonary effort is normal. No accessory muscle usage or respiratory distress.     Breath sounds: Normal breath sounds.  Abdominal:     General: Bowel sounds are normal.     Palpations: Abdomen is soft.  Musculoskeletal:     Cervical back: Normal range of motion and neck supple.     Right lower leg: No edema.     Left lower leg: No edema.  Skin:    General: Skin is warm and dry.  Neurological:     Mental Status: She is alert and oriented to person, place, and time.  Psychiatric:        Attention and Perception: Attention normal.        Mood and Affect: Mood normal.        Speech: Speech normal.        Behavior: Behavior normal. Behavior is cooperative.        Thought Content: Thought content normal.     Results for orders placed or performed in visit on 03/29/20  Comprehensive metabolic panel  Result Value Ref Range   Glucose 110 (H) 65 - 99 mg/dL   BUN 15 8 - 27 mg/dL   Creatinine, Ser 0.79 0.57 - 1.00 mg/dL   GFR calc non Af Amer 76 >59 mL/min/1.73   GFR calc Af Amer 88 >59 mL/min/1.73   BUN/Creatinine Ratio 19 12 - 28   Sodium 142 134 - 144 mmol/L   Potassium 4.6 3.5 - 5.2 mmol/L   Chloride 103 96 - 106 mmol/L   CO2 24 20 - 29 mmol/L   Calcium 9.6 8.7 - 10.3 mg/dL   Total Protein 7.7 6.0 - 8.5 g/dL   Albumin 4.6 3.8 - 4.8 g/dL   Globulin, Total 3.1 1.5 - 4.5 g/dL   Albumin/Globulin Ratio 1.5 1.2 - 2.2   Bilirubin Total 0.4 0.0 - 1.2 mg/dL   Alkaline Phosphatase 85 48 - 121 IU/L   AST 41 (H) 0 - 40 IU/L   ALT 42 (H) 0 - 32 IU/L      Assessment & Plan:   Problem List Items Addressed This Visit      Cardiovascular and Mediastinum   Hypertension associated with diabetes ([place])    Chronic, ongoing with initial BP elevated, but  repeat at goal and similar to her home BPs which are at goal.  Discussed with her goal BP <130/80.  Continue Losartan at this time and adjust dose as needed, is tolerating well without ADR -- offers kidney protection with **Note De-Identified Aguila Obfuscation** her diabetes.  Recommend she continue to monitor BP at home daily and document for provider + focus on DASH diet.  CMP today.  Return to office in 3 months.      Relevant Orders   Comprehensive metabolic panel   Bayer DCA Hb A1c Waived   Aortic atherosclerosis ([place])    Noted on CT 06/03/18.  Continue daily statin and ASA for prevention.        Endocrine   Hyperlipidemia associated with type 2 diabetes mellitus ([facility])    Chronic, ongoing.  Continue current medication regimen and adjust as needed.  Lipid panel today.      Relevant Orders   Lipid Panel w/o Chol/HDL Ratio   Comprehensive metabolic panel   Bayer DCA Hb A1c Waived   Type 2 diabetes mellitus with morbid obesity ([facility]) - Primary    Chronic, ongoing with A1C with upward trend today at 7.1%.  Will continue diet focus at this time, as she would prefer not to start medication, but is willing to if elevation continues to present.  Continue Losartan for kidney protection due to urine ALB 80 at recent visit.  Continue to monitor BS at home daily and document + focus on diet.  Return in 3 months.      Relevant Orders   Bayer DCA Hb A1c Waived     Other   Obesity    Recommended eating smaller high protein, low fat meals more frequently and exercising 30 mins a day 5 times a week with a goal of 10-15lb weight loss in the next 3 months. Patient voiced their understanding and motivation to adhere to these recommendations.       History of endometrial cancer    Stable, continue collaboration with GYN.          Follow up plan: Return in about 3 months (around 08/31/2020) for Annual physical -- plus diabetes check.

## 2020-05-31 NOTE — Assessment & Plan Note (Signed)
**Note De-Identified Som Obfuscation** Chronic, ongoing with initial BP elevated, but repeat at goal and similar to her home BPs which are at goal.  Discussed with her goal BP <130/80.  Continue Losartan at this time and adjust dose as needed, is tolerating well without ADR -- offers kidney protection with her diabetes.  Recommend she continue to monitor BP at home daily and document for provider + focus on DASH diet.  CMP today.  Return to office in 3 months.

## 2020-05-31 NOTE — Assessment & Plan Note (Signed)
**Note De-identified Vanriper Obfuscation** Chronic, ongoing.  Continue current medication regimen and adjust as needed. Lipid panel today. 

## 2020-05-31 NOTE — Patient Instructions (Signed)
**Note De-identified Weekly Obfuscation** Diabetes Mellitus and Nutrition, Adult When you have diabetes (diabetes mellitus), it is very important to have healthy eating habits because your blood sugar (glucose) levels are greatly affected by what you eat and drink. Eating healthy foods in the appropriate amounts, at about the same times every day, can help you:  Control your blood glucose.  Lower your risk of heart disease.  Improve your blood pressure.  Reach or maintain a healthy weight. Every person with diabetes is different, and each person has different needs for a meal plan. Your health care provider may recommend that you work with a diet and nutrition specialist (dietitian) to make a meal plan that is best for you. Your meal plan may vary depending on factors such as:  The calories you need.  The medicines you take.  Your weight.  Your blood glucose, blood pressure, and cholesterol levels.  Your activity level.  Other health conditions you have, such as heart or kidney disease. How do carbohydrates affect me? Carbohydrates, also called carbs, affect your blood glucose level more than any other type of food. Eating carbs naturally raises the amount of glucose in your blood. Carb counting is a method for keeping track of how many carbs you eat. Counting carbs is important to keep your blood glucose at a healthy level, especially if you use insulin or take certain oral diabetes medicines. It is important to know how many carbs you can safely have in each meal. This is different for every person. Your dietitian can help you calculate how many carbs you should have at each meal and for each snack. Foods that contain carbs include:  Bread, cereal, rice, pasta, and crackers.  Potatoes and corn.  Peas, beans, and lentils.  Milk and yogurt.  Fruit and juice.  Desserts, such as cakes, cookies, ice cream, and candy. How does alcohol affect me? Alcohol can cause a sudden decrease in blood glucose (hypoglycemia),  especially if you use insulin or take certain oral diabetes medicines. Hypoglycemia can be a life-threatening condition. Symptoms of hypoglycemia (sleepiness, dizziness, and confusion) are similar to symptoms of having too much alcohol. If your health care provider says that alcohol is safe for you, follow these guidelines:  Limit alcohol intake to no more than 1 drink per day for nonpregnant women and 2 drinks per day for men. One drink equals 12 oz of beer, 5 oz of wine, or 1 oz of hard liquor.  Do not drink on an empty stomach.  Keep yourself hydrated with water, diet soda, or unsweetened iced tea.  Keep in mind that regular soda, juice, and other mixers may contain a lot of sugar and must be counted as carbs. What are tips for following this plan?  Reading food labels  Start by checking the serving size on the "Nutrition Facts" label of packaged foods and drinks. The amount of calories, carbs, fats, and other nutrients listed on the label is based on one serving of the item. Many items contain more than one serving per package.  Check the total grams (g) of carbs in one serving. You can calculate the number of servings of carbs in one serving by dividing the total carbs by 15. For example, if a food has 30 g of total carbs, it would be equal to 2 servings of carbs.  Check the number of grams (g) of saturated and trans fats in one serving. Choose foods that have low or no amount of these fats.  Check the number of  **Note De-identified Liller Obfuscation** milligrams (mg) of salt (sodium) in one serving. Most people should limit total sodium intake to less than 2,300 mg per day.  Always check the nutrition information of foods labeled as "low-fat" or "nonfat". These foods may be higher in added sugar or refined carbs and should be avoided.  Talk to your dietitian to identify your daily goals for nutrients listed on the label. Shopping  Avoid buying canned, premade, or processed foods. These foods tend to be high in fat, sodium,  and added sugar.  Shop around the outside edge of the grocery store. This includes fresh fruits and vegetables, bulk grains, fresh meats, and fresh dairy. Cooking  Use low-heat cooking methods, such as baking, instead of high-heat cooking methods like deep frying.  Cook using healthy oils, such as olive, canola, or sunflower oil.  Avoid cooking with butter, cream, or high-fat meats. Meal planning  Eat meals and snacks regularly, preferably at the same times every day. Avoid going long periods of time without eating.  Eat foods high in fiber, such as fresh fruits, vegetables, beans, and whole grains. Talk to your dietitian about how many servings of carbs you can eat at each meal.  Eat 4-6 ounces (oz) of lean protein each day, such as lean meat, chicken, fish, eggs, or tofu. One oz of lean protein is equal to: ? 1 oz of meat, chicken, or fish. ? 1 egg. ?  cup of tofu.  Eat some foods each day that contain healthy fats, such as avocado, nuts, seeds, and fish. Lifestyle  Check your blood glucose regularly.  Exercise regularly as told by your health care provider. This may include: ? 150 minutes of moderate-intensity or vigorous-intensity exercise each week. This could be brisk walking, biking, or water aerobics. ? Stretching and doing strength exercises, such as yoga or weightlifting, at least 2 times a week.  Take medicines as told by your health care provider.  Do not use any products that contain nicotine or tobacco, such as cigarettes and e-cigarettes. If you need help quitting, ask your health care provider.  Work with a counselor or diabetes educator to identify strategies to manage stress and any emotional and social challenges. Questions to ask a health care provider  Do I need to meet with a diabetes educator?  Do I need to meet with a dietitian?  What number can I call if I have questions?  When are the best times to check my blood glucose? Where to find more  information:  American Diabetes Association: diabetes.org  Academy of Nutrition and Dietetics: www.eatright.org  National Institute of Diabetes and Digestive and Kidney Diseases (NIH): www.niddk.nih.gov Summary  A healthy meal plan will help you control your blood glucose and maintain a healthy lifestyle.  Working with a diet and nutrition specialist (dietitian) can help you make a meal plan that is best for you.  Keep in mind that carbohydrates (carbs) and alcohol have immediate effects on your blood glucose levels. It is important to count carbs and to use alcohol carefully. This information is not intended to replace advice given to you by your health care provider. Make sure you discuss any questions you have with your health care provider. Document Revised: 07/13/2017 Document Reviewed: 09/04/2016 Elsevier Patient Education  2020 [employer]  

## 2020-06-01 ENCOUNTER — Other Ambulatory Visit: Payer: Self-pay | Admitting: Nurse Practitioner

## 2020-06-01 LAB — COMPREHENSIVE METABOLIC PANEL
ALT: 36 IU/L — ABNORMAL HIGH (ref 0–32)
AST: 26 IU/L (ref 0–40)
Albumin/Globulin Ratio: 1.4 (ref 1.2–2.2)
Albumin: 4.4 g/dL (ref 3.7–4.7)
Alkaline Phosphatase: 94 IU/L (ref 44–121)
BUN/Creatinine Ratio: 14 (ref 12–28)
BUN: 9 mg/dL (ref 8–27)
Bilirubin Total: 0.6 mg/dL (ref 0.0–1.2)
CO2: 24 mmol/L (ref 20–29)
Calcium: 9.3 mg/dL (ref 8.7–10.3)
Chloride: 103 mmol/L (ref 96–106)
Creatinine, Ser: 0.65 mg/dL (ref 0.57–1.00)
GFR calc Af Amer: 103 mL/min/{1.73_m2} (ref >59)
GFR calc non Af Amer: 90 mL/min/{1.73_m2} (ref >59)
Globulin, Total: 3.1 g/dL (ref 1.5–4.5)
Glucose: 114 mg/dL — ABNORMAL HIGH (ref 65–99)
Potassium: 4.2 mmol/L (ref 3.5–5.2)
Sodium: 141 mmol/L (ref 134–144)
Total Protein: 7.5 g/dL (ref 6.0–8.5)

## 2020-06-01 LAB — LIPID PANEL W/O CHOL/HDL RATIO
Cholesterol, Total: 181 mg/dL (ref 100–199)
HDL: 33 mg/dL — ABNORMAL LOW (ref >39)
LDL Chol Calc (NIH): 115 mg/dL — ABNORMAL HIGH (ref 0–99)
Triglycerides: 189 mg/dL — ABNORMAL HIGH (ref 0–149)
VLDL Cholesterol Cal: 33 mg/dL (ref 5–40)

## 2020-06-01 MED ORDER — ATORVASTATIN CALCIUM 40 MG PO TABS
40.0000 mg | ORAL_TABLET | Freq: Every day | ORAL | 3 refills | Status: DC
Start: 2020-06-01 — End: 2020-09-10

## 2020-06-01 NOTE — Progress Notes (Signed)
**Note De-Identified Mullin Obfuscation** Atorvastatin dose increase to 40 MG

## 2020-06-01 NOTE — Progress Notes (Signed)
**Note De-Identified Villari Obfuscation** Please let Li know her labs have returned.  Kidney function remains stable and liver labs are improving, they were mildly elevated last visit.  Cholesterol levels continue to show some elevation.  Were you fasting for visit?  Are you taking Atorvastatin daily?  May be good to increase dose Atorvastatin if you were fasting and are taking medication.  Let me know if you would like to increase.  Any questions? Keep being awesome!!  Thank you for allowing me to participate in your care. Kindest regards, Jaedan Huttner

## 2020-06-02 ENCOUNTER — Other Ambulatory Visit: Payer: Self-pay

## 2020-06-02 MED ORDER — LOSARTAN POTASSIUM 25 MG PO TABS: 25.0000 mg | ORAL_TABLET | Freq: Every day | ORAL | 4 refills | Status: DC

## 2020-06-04 ENCOUNTER — Other Ambulatory Visit: Payer: Self-pay

## 2020-06-04 MED ORDER — LOSARTAN POTASSIUM 25 MG PO TABS: 25.0000 mg | ORAL_TABLET | Freq: Every day | ORAL | 4 refills | Status: DC

## 2020-06-21 ENCOUNTER — Telehealth: Payer: Self-pay

## 2020-06-21 NOTE — Telephone Encounter (Signed)
**Note De-Identified Eichinger Obfuscation** Letter sent regarding cancellation with no reschedule for 03/2020 endometrial cancer surveillance and over due chest Ct for surveillance of nodules.

## 2020-07-23 ENCOUNTER — Other Ambulatory Visit: Payer: Self-pay | Admitting: Nurse Practitioner

## 2020-07-23 ENCOUNTER — Telehealth: Payer: Self-pay | Admitting: Nurse Practitioner

## 2020-07-23 MED ORDER — OLMESARTAN MEDOXOMIL 20 MG PO TABS: 20.0000 mg | ORAL_TABLET | Freq: Every day | ORAL | 4 refills | Status: DC

## 2020-07-23 NOTE — Telephone Encounter (Signed)
**Note De-Identified Broerman Obfuscation** Have sent in Olmesartan, which is in same family, to [place].

## 2020-07-23 NOTE — Telephone Encounter (Signed)
**Note De-identified Riedl Obfuscation** Please advise 

## 2020-07-23 NOTE — Telephone Encounter (Signed)
**Note De-Identified Stegmann Obfuscation** Pharmacy need an alternative prescription since they out of this one  / Losartan (COZAAR) 25 MG tablet [[id number]] please advise

## 2020-07-23 NOTE — Telephone Encounter (Signed)
**Note De-Identified Stanforth Obfuscation** Per patient received an email from [facility] saying she needed to be sent in a new medication in place of losartan due to them being out of stock.

## 2020-07-29 LAB — HM DIABETES EYE EXAM

## 2020-09-01 ENCOUNTER — Other Ambulatory Visit: Payer: Self-pay

## 2020-09-01 ENCOUNTER — Encounter: Payer: Self-pay | Admitting: Nurse Practitioner

## 2020-09-01 ENCOUNTER — Ambulatory Visit (INDEPENDENT_AMBULATORY_CARE_PROVIDER_SITE_OTHER): Payer: Medicare Other | Admitting: Nurse Practitioner

## 2020-09-01 VITALS — BP 134/80 | HR 80 | Temp 98.0°F | Ht 60.5 in | Wt 164.8 lb

## 2020-09-01 DIAGNOSIS — Z6831 Body mass index (BMI) 31.0-31.9, adult: Secondary | ICD-10-CM

## 2020-09-01 DIAGNOSIS — Z8542 Personal history of malignant neoplasm of other parts of uterus: Secondary | ICD-10-CM

## 2020-09-01 DIAGNOSIS — Z0000 Encounter for general adult medical examination without abnormal findings: Secondary | ICD-10-CM

## 2020-09-01 DIAGNOSIS — Z8616 Personal history of COVID-19: Secondary | ICD-10-CM | POA: Diagnosis not present

## 2020-09-01 DIAGNOSIS — I152 Hypertension secondary to endocrine disorders: Secondary | ICD-10-CM | POA: Diagnosis not present

## 2020-09-01 DIAGNOSIS — E785 Hyperlipidemia, unspecified: Secondary | ICD-10-CM

## 2020-09-01 DIAGNOSIS — E1169 Type 2 diabetes mellitus with other specified complication: Secondary | ICD-10-CM | POA: Diagnosis not present

## 2020-09-01 DIAGNOSIS — I700 Atherosclerosis of aorta: Secondary | ICD-10-CM | POA: Diagnosis not present

## 2020-09-01 DIAGNOSIS — R911 Solitary pulmonary nodule: Secondary | ICD-10-CM

## 2020-09-01 DIAGNOSIS — E6609 Other obesity due to excess calories: Secondary | ICD-10-CM

## 2020-09-01 DIAGNOSIS — E1159 Type 2 diabetes mellitus with other circulatory complications: Secondary | ICD-10-CM | POA: Diagnosis not present

## 2020-09-01 LAB — MICROALBUMIN, URINE WAIVED
Creatinine, Urine Waived: 100 mg/dL (ref 10–300)
Microalb, Ur Waived: 30 mg/L — ABNORMAL HIGH (ref 0–19)
Microalb/Creat Ratio: <30 mg/g (ref <30)

## 2020-09-01 LAB — BAYER DCA HB A1C WAIVED: HB A1C (BAYER DCA - WAIVED): 6.7 % (ref <7.0)

## 2020-09-01 NOTE — Assessment & Plan Note (Signed)
**Note De-Identified Vaden Obfuscation** Noted on CT 5 mm in 2019 with recommendation to repeat in 2 years.  Discussed at length with patient and will order repeat CT today.

## 2020-09-01 NOTE — Progress Notes (Addendum)
**Note De-Identified Hughson Obfuscation** BP 134/80   Pulse 80   Temp 98 F (36.7 C)   Ht 5' 0.5" (1.537 m)   Wt 164 lb 12.8 oz (74.8 kg)   LMP  (LMP Unknown)   SpO2 97%   BMI 31.66 kg/m    Subjective:    Patient ID: Suzanne Jennings, female    DOB: December 04, 1950, 70 y.o.   MRN: [id number]  HPI: Suzanne Jennings is a 70 y.o. female presenting on 09/01/2020 for comprehensive medical examination. Current medical complaints include:none  She currently lives with: husband Menopausal Symptoms: no   DIABETES Currently diet controlled.  Last A1C in October 7.1%.  Drinks a lot of water and walks a lot.  Has never taken medication for her diabetes.  She has been focused on diet and lost 13 pounds. Is interested in [place] program which her son and daughter are doing.     Hypoglycemic episodes:no Polydipsia/polyuria: no Visual disturbance: no Chest pain: no Paresthesias: no Glucose Monitoring: yes             Accucheck frequency: every other day             Fasting glucose: 100 range             Post prandial:             Evening:             Before meals: Taking Insulin?: no             Long acting insulin:             Short acting insulin: Blood Pressure Monitoring: daily Retinal Examination: Up to Date  Foot Exam: Up to Date Pneumovax: refused Influenza: refused Aspirin: yes   HYPERTENSION / HYPERLIPIDEMIA Continues on Atorvastatin, Olmesartan, + ASA -- did not not take medicine yet this morning. Has history of CT scan in 2019 noting aortic atherosclerosis -- has history on this CT of a 5 mm nodule with recommendation to repeat in 2 years. Has history of Covid 2 weeks ago, reports overall feeling better. Satisfied with current treatment? yes Duration of hypertension: chronic BP monitoring frequency: daily BP range: 124/70 range at home BP medication side effects: no Duration of hyperlipidemia: chronic Cholesterol medication side effects: no Cholesterol supplements: none Medication compliance: good compliance Aspirin:  yes Recent stressors: no Recurrent headaches: no Visual changes: no Palpitations: no Dyspnea: no Chest pain: no Lower extremity edema: no Dizzy/lightheaded: no   ENDOMETRIAL CANCER: History of endometrial cancer (grade 1 endometrial cancer with focal polyp) in June 2019, a complete hysterectomy was performed. No chemo or radiation.  She continues to be followed by GYN, last visit 03/26/19 -- missed recent appointment scheduled with Dr. Fransisca Connors for 03/31/20.  Reports overall doing well and does not want any further internal exams.  Denies any symptoms.  Depression Screen done today and results listed below:  Depression screen [facility] 2/9 09/01/2020 02/09/2020 06/13/2018 03/29/2017 03/12/2017  Decreased Interest 0 0 0 0 0  Down, Depressed, Hopeless 0 0 0 0 0  PHQ - 2 Score 0 0 0 0 0  Altered sleeping - - 0 - 0  Tired, decreased energy - - 0 - 0  Change in appetite - - 0 - 0  Feeling bad or failure about yourself  - - 0 - 0  Trouble concentrating - - 0 - 0  Moving slowly or fidgety/restless - - 0 - 0  Suicidal thoughts - - 0 - 0 **Note De-Identified Weingarten Obfuscation** PHQ-9 Score - - 0 - 0    The patient has a history of falls. I did complete a risk assessment for falls. A plan of care for falls was documented.   Past Medical History:  Past Medical History:  Diagnosis Date  . Diabetes mellitus without complication ([place])   . Endometrial cancer ([place])   . History of kidney stones   . Hyperlipidemia   . Hypertension   . Kidney stone   . Knee pain     Surgical History:  Past Surgical History:  Procedure Laterality Date  . APPENDECTOMY    . COLONOSCOPY WITH PROPOFOL N/A 07/03/2016   Procedure: COLONOSCOPY WITH PROPOFOL;  Surgeon: Lucilla Lame, MD;  Location: [facility];  Service: Endoscopy;  Laterality: N/A;  . LAPAROSCOPIC HYSTERECTOMY N/A 02/20/2018   Procedure: HYSTERECTOMY TOTAL LAPAROSCOPIC, WITH BILATERAL SALPINGOOPHERECTOMY;  Surgeon: Mellody Drown, MD;  Location: [facility];  Service: Gynecology;   Laterality: N/A;  . POLYPECTOMY  07/03/2016   Procedure: POLYPECTOMY;  Surgeon: Lucilla Lame, MD;  Location: [facility];  Service: Endoscopy;;  . SENTINEL NODE BIOPSY N/A 02/20/2018   Procedure: SENTINEL NODE BIOPSY AND MAPPING;  Surgeon: Mellody Drown, MD;  Location: [facility];  Service: Gynecology;  Laterality: N/A;  . TUBAL LIGATION      Medications:  Current Outpatient Medications on File Prior to Visit  Medication Sig  . aspirin EC 81 MG tablet Take 1 tablet (81 mg total) by mouth daily.  Marland Kitchen atorvastatin (LIPITOR) 40 MG tablet Take 1 tablet (40 mg total) by mouth daily.  Marland Kitchen olmesartan (BENICAR) 20 MG tablet Take 1 tablet (20 mg total) by mouth daily.  Glory Rosebush VERIO test strip One Touch Verio- pt states she checks her sugar everyday  . OVER THE COUNTER MEDICATION Take 1 capsule by mouth daily. Zendocrine otc supplement  . Probiotic Product (PROBIOTIC PO) Take 1 capsule by mouth daily.    No current facility-administered medications on file prior to visit.    Allergies:  Allergies  Allergen Reactions  . Aspirin Other (See Comments)    "blood thinner, makes nose bleed - but pt can take 81 mg dose without problems  . Other Other (See Comments)    Yogurt causes yeast infections Nuts except peanuts and almonds cause anaphylaxis   . Penicillins Swelling and Rash    Has patient had a PCN reaction causing immediate rash, facial/tongue/throat swelling, SOB or lightheadedness with hypotension: Yes Has patient had a PCN reaction causing severe rash involving mucus membranes or skin necrosis: No Has patient had a PCN reaction that required hospitalization: No Has patient had a PCN reaction occurring within the last 10 years: No If all of the above answers are "NO", then may proceed with Cephalosporin use.   . Sulfa Antibiotics Swelling and Rash    Social History:  Social History   Socioeconomic History  . Marital status: Married    Spouse name: Not on file  . Number of  children: Not on file  . Years of education: Not on file  . Highest education level: Not on file  Occupational History  . Occupation: retired  Tobacco Use  . Smoking status: Never Smoker  . Smokeless tobacco: Never Used  Vaping Use  . Vaping Use: Never used  Substance and Sexual Activity  . Alcohol use: No    Alcohol/week: 0.0 standard drinks  . Drug use: No  . Sexual activity: Yes    Birth control/protection: Post-menopausal  Other Topics Concern  . **Note De-Identified Zazueta Obfuscation** Not on file  Social History Narrative  . Not on file   Social Determinants of Health   Financial Resource Strain: Low Risk   . Difficulty of Paying Living Expenses: Not hard at all  Food Insecurity: No Food Insecurity  . Worried About Charity fundraiser in the Last Year: Never true  . Ran Out of Food in the Last Year: Never true  Transportation Needs: No Transportation Needs  . Lack of Transportation (Medical): No  . Lack of Transportation (Non-Medical): No  Physical Activity: Sufficiently Active  . Days of Exercise per Week: 7 days  . Minutes of Exercise per Session: 40 min  Stress: No Stress Concern Present  . Feeling of Stress : Not at all  Social Connections: Not on file  Intimate Partner Violence: Not on file   Social History   Tobacco Use  Smoking Status Never Smoker  Smokeless Tobacco Never Used   Social History   Substance and Sexual Activity  Alcohol Use No  . Alcohol/week: 0.0 standard drinks    Family History:  Family History  Problem Relation Age of Onset  . Emphysema Mother   . Heart disease Father        massive MI  . Hypertension Sister   . Mental illness Brother   . Hypertension Sister   . Hypertension Sister   . Diabetes Maternal Grandmother   . Breast cancer Neg Hx     Past medical history, surgical history, medications, allergies, family history and social history reviewed with patient today and changes made to appropriate areas of the chart.   Review of Systems - negative All other  ROS negative except what is listed above and in the HPI.      Objective:    BP 134/80   Pulse 80   Temp 98 F (36.7 C)   Ht 5' 0.5" (1.537 m)   Wt 164 lb 12.8 oz (74.8 kg)   LMP  (LMP Unknown)   SpO2 97%   BMI 31.66 kg/m   Wt Readings from Last 3 Encounters:  09/01/20 164 lb 12.8 oz (74.8 kg)  05/31/20 167 lb (75.8 kg)  03/29/20 177 lb (80.3 kg)    Physical Exam Vitals and nursing note reviewed.  Constitutional:      General: She is awake. She is not in acute distress.    Appearance: She is well-developed and well-groomed. She is obese. She is not ill-appearing.  HENT:     Head: Normocephalic and atraumatic.     Right Ear: Hearing, tympanic membrane, ear canal and external ear normal. No drainage.     Left Ear: Hearing, tympanic membrane, ear canal and external ear normal. No drainage.     Nose: Nose normal.     Right Sinus: No maxillary sinus tenderness or frontal sinus tenderness.     Left Sinus: No maxillary sinus tenderness or frontal sinus tenderness.     Mouth/Throat:     Mouth: Mucous membranes are moist.     Pharynx: Oropharynx is clear. Uvula midline. No pharyngeal swelling, oropharyngeal exudate or posterior oropharyngeal erythema.  Eyes:     General: Lids are normal.        Right eye: No discharge.        Left eye: No discharge.     Extraocular Movements: Extraocular movements intact.     Conjunctiva/sclera: Conjunctivae normal.     Pupils: Pupils are equal, round, and reactive to light.     Visual Fields: Right eye visual **Note De-Identified Detwiler Obfuscation** fields normal and left eye visual fields normal.  Neck:     Thyroid: No thyromegaly.     Vascular: No carotid bruit.     Trachea: Trachea normal.  Cardiovascular:     Rate and Rhythm: Normal rate and regular rhythm.     Heart sounds: Normal heart sounds. No murmur heard. No gallop.   Pulmonary:     Effort: Pulmonary effort is normal. No accessory muscle usage or respiratory distress.     Breath sounds: Normal breath sounds.  Chest:   Breasts:     Right: Normal. No axillary adenopathy or supraclavicular adenopathy.     Left: Normal. No axillary adenopathy or supraclavicular adenopathy.    Abdominal:     General: Bowel sounds are normal.     Palpations: Abdomen is soft. There is no hepatomegaly or splenomegaly.     Tenderness: There is no abdominal tenderness.  Musculoskeletal:        General: Normal range of motion.     Cervical back: Normal range of motion and neck supple.     Right lower leg: No edema.     Left lower leg: No edema.  Lymphadenopathy:     Head:     Right side of head: No submental, submandibular, tonsillar, preauricular or posterior auricular adenopathy.     Left side of head: No submental, submandibular, tonsillar, preauricular or posterior auricular adenopathy.     Cervical: No cervical adenopathy.     Upper Body:     Right upper body: No supraclavicular, axillary or pectoral adenopathy.     Left upper body: No supraclavicular, axillary or pectoral adenopathy.  Skin:    General: Skin is warm and dry.     Capillary Refill: Capillary refill takes less than 2 seconds.     Findings: No rash.  Neurological:     Mental Status: She is alert and oriented to person, place, and time.     Cranial Nerves: Cranial nerves are intact.     Gait: Gait is intact.     Deep Tendon Reflexes: Reflexes are normal and symmetric.     Reflex Scores:      Brachioradialis reflexes are 2+ on the right side and 2+ on the left side.      Patellar reflexes are 2+ on the right side and 2+ on the left side. Psychiatric:        Attention and Perception: Attention normal.        Mood and Affect: Mood normal.        Speech: Speech normal.        Behavior: Behavior normal. Behavior is cooperative.        Thought Content: Thought content normal.        Judgment: Judgment normal.     Results for orders placed or performed in visit on 09/01/20  Bayer DCA Hb A1c Waived  Result Value Ref Range   HB A1C (BAYER DCA -  WAIVED) 6.7 <7.0 %  Microalbumin, Urine Waived  Result Value Ref Range   Microalb, Ur Waived 30 (H) 0 - 19 mg/L   Creatinine, Urine Waived 100 10 - 300 mg/dL   Microalb/Creat Ratio <30 <30 mg/g  Comprehensive metabolic panel  Result Value Ref Range   Glucose 129 (H) 65 - 99 mg/dL   BUN 10 8 - 27 mg/dL   Creatinine, Ser 0.76 0.57 - 1.00 mg/dL   GFR calc non Af Amer 79 >59 mL/min/1.73   GFR calc Af Amer 91 >59 **Note De-Identified Fiorillo Obfuscation** mL/min/1.73   BUN/Creatinine Ratio 13 12 - 28   Sodium 140 134 - 144 mmol/L   Potassium 4.5 3.5 - 5.2 mmol/L   Chloride 103 96 - 106 mmol/L   CO2 22 20 - 29 mmol/L   Calcium 9.6 8.7 - 10.3 mg/dL   Total Protein 7.3 6.0 - 8.5 g/dL   Albumin 4.3 3.7 - 4.7 g/dL   Globulin, Total 3.0 1.5 - 4.5 g/dL   Albumin/Globulin Ratio 1.4 1.2 - 2.2   Bilirubin Total 0.7 0.0 - 1.2 mg/dL   Alkaline Phosphatase 104 44 - 121 IU/L   AST 29 0 - 40 IU/L   ALT 33 (H) 0 - 32 IU/L  Lipid Panel w/o Chol/HDL Ratio  Result Value Ref Range   Cholesterol, Total 150 100 - 199 mg/dL   Triglycerides 187 (H) 0 - 149 mg/dL   HDL 31 (L) >39 mg/dL   VLDL Cholesterol Cal 32 5 - 40 mg/dL   LDL Chol Calc (NIH) 87 0 - 99 mg/dL  TSH  Result Value Ref Range   TSH 2.350 0.450 - 4.500 uIU/mL  CBC with Differential/Platelet  Result Value Ref Range   WBC 8.6 3.4 - 10.8 x10E3/uL   RBC 4.28 3.77 - 5.28 x10E6/uL   Hemoglobin 13.7 11.1 - 15.9 g/dL   Hematocrit 41.1 34.0 - 46.6 %   MCV 96 79 - 97 fL   MCH 32.0 26.6 - 33.0 pg   MCHC 33.3 31.5 - 35.7 g/dL   RDW 11.7 11.7 - 15.4 %   Platelets 314 150 - 450 x10E3/uL   Neutrophils 62 Not Estab. %   Lymphs 28 Not Estab. %   Monocytes 8 Not Estab. %   Eos 1 Not Estab. %   Basos 1 Not Estab. %   Neutrophils Absolute 5.4 1.4 - 7.0 x10E3/uL   Lymphocytes Absolute 2.4 0.7 - 3.1 x10E3/uL   Monocytes Absolute 0.7 0.1 - 0.9 x10E3/uL   EOS (ABSOLUTE) 0.1 0.0 - 0.4 x10E3/uL   Basophils Absolute 0.0 0.0 - 0.2 x10E3/uL   Immature Granulocytes 0 Not Estab. %   Immature Grans  (Abs) 0.0 0.0 - 0.1 x10E3/uL      Assessment & Plan:   Problem List Items Addressed This Visit      Cardiovascular and Mediastinum   Hypertension associated with diabetes ([place]) - Primary    Chronic, ongoing with initial BP elevated (often elevated on initial in office -- white coat), but repeat at goal and similar to her home BPs which are at goal.  Discussed with her goal BP <130/80.  Continue Olmesartan at this time and adjust dose as needed, is tolerating well without ADR -- offers kidney protection with her diabetes.  Recommend she continue to monitor BP at home daily and document for provider + focus on DASH diet.  CMP and TSH today.  Return to office in 6 months.      Relevant Orders   Bayer DCA Hb A1c Waived (Completed)   Microalbumin, Urine Waived (Completed)   Comprehensive metabolic panel (Completed)   TSH (Completed)   Aortic atherosclerosis ([place])    Noted on CT 06/03/18.  Continue daily statin and ASA for prevention.        Endocrine   Hyperlipidemia associated with type 2 diabetes mellitus ([facility])    Chronic, ongoing.  Continue current medication regimen and adjust as needed.  Lipid panel today.      Relevant Orders   Bayer DCA Hb A1c Waived (Completed) **Note De-Identified Flegal Obfuscation** Lipid Panel w/o Chol/HDL Ratio (Completed)   Type 2 diabetes mellitus with morbid obesity ([facility])    Chronic, ongoing with A1C with downward trend today at 6.7%.  Will continue diet focus at this time (wishes to start [place] program, discussed with her today), as she would prefer not to start medication, but is willing to if elevation consistent on checks.  Continue Olmesartan for kidney protection due to urine ALB 30 on check today, some improvement from previous of 80.  Continue to monitor BS at home daily and document + focus on diet.  Return in 6 months.      Relevant Orders   Bayer DCA Hb A1c Waived (Completed)   Microalbumin, Urine Waived (Completed)   CBC with Differential/Platelet (Completed)     Other    Obesity    BMI 31.66.  Has continue to lose weight.  Recommended eating smaller high protein, low fat meals more frequently and exercising 30 mins a day 5 times a week with a goal of 10-15lb weight loss in the next 3 months. Patient voiced their understanding and motivation to adhere to these recommendations.       History of endometrial cancer    Stable, continue collaboration with GYN -- at this time she does not want any further internal exams performed and reports no symptoms present -- if symptoms present she agrees wit notify GYN or PCP.      History of 2019 novel coronavirus disease (COVID-19)    Acute and improved at this time.  Continue to monitor for any long term effects from virus.      Lung nodule    Noted on CT 5 mm in 2019 with recommendation to repeat in 2 years.  Discussed at length with patient and will order repeat CT today.      Relevant Orders   CT Chest Wo Contrast (Completed)    Other Visit Diagnoses    Annual physical exam       Annual labs today to include CBC, CMP, TSH, LIPID       Follow up plan: Return in about 6 months (around 03/01/2021) for T2DM, HTN/HLD.   LABORATORY TESTING:  - Pap smear: not applicable  IMMUNIZATIONS:   - Tdap: Tetanus vaccination status reviewed: last tetanus booster within 10 years. - Influenza: Refused - Pneumovax: Refused - Prevnar: Refused - HPV: Not applicable - Zostavax vaccine: Refused  SCREENING: -Mammogram: Up to date  - Colonoscopy: Up to date  - Bone Density: Up to date  -Hearing Test: Not applicable  -Spirometry: Not applicable   PATIENT COUNSELING:   Advised to take 1 mg of folate supplement per day if capable of pregnancy.   Sexuality: Discussed sexually transmitted diseases, partner selection, use of condoms, avoidance of unintended pregnancy  and contraceptive alternatives.   Advised to avoid cigarette smoking.  I discussed with the patient that most people either abstain from alcohol or drink  within safe limits (<=14/week and <=4 drinks/occasion for males, <=7/weeks and <= 3 drinks/occasion for females) and that the risk for alcohol disorders and other health effects rises proportionally with the number of drinks per week and how often a drinker exceeds daily limits.  Discussed cessation/primary prevention of drug use and availability of treatment for abuse.   Diet: Encouraged to adjust caloric intake to maintain  or achieve ideal body weight, to reduce intake of dietary saturated fat and total fat, to limit sodium intake by avoiding high sodium foods and not adding table salt, and **Note De-Identified Dinsmore Obfuscation** to maintain adequate dietary potassium and calcium preferably from fresh fruits, vegetables, and low-fat dairy products.    Stressed the importance of regular exercise  Injury prevention: Discussed safety belts, safety helmets, smoke detector, smoking near bedding or upholstery.   Dental health: Discussed importance of regular tooth brushing, flossing, and dental visits.    NEXT PREVENTATIVE PHYSICAL DUE IN 1 YEAR. Return in about 6 months (around 03/01/2021) for T2DM, HTN/HLD.

## 2020-09-01 NOTE — Assessment & Plan Note (Signed)
**Note De-Identified Varnum Obfuscation** Stable, continue collaboration with GYN -- at this time she does not want any further internal exams performed and reports no symptoms present -- if symptoms present she agrees wit notify GYN or PCP.

## 2020-09-01 NOTE — Assessment & Plan Note (Signed)
**Note De-Identified Ahonen Obfuscation** Chronic, ongoing with initial BP elevated (often elevated on initial in office -- white coat), but repeat at goal and similar to her home BPs which are at goal.  Discussed with her goal BP <130/80.  Continue Olmesartan at this time and adjust dose as needed, is tolerating well without ADR -- offers kidney protection with her diabetes.  Recommend she continue to monitor BP at home daily and document for provider + focus on DASH diet.  CMP and TSH today.  Return to office in 6 months.

## 2020-09-01 NOTE — Assessment & Plan Note (Signed)
**Note De-identified Pangle Obfuscation** Chronic, ongoing.  Continue current medication regimen and adjust as needed. Lipid panel today. 

## 2020-09-01 NOTE — Assessment & Plan Note (Signed)
**Note De-Identified Axel Obfuscation** Acute and improved at this time.  Continue to monitor for any long term effects from virus.

## 2020-09-01 NOTE — Assessment & Plan Note (Signed)
**Note De-Identified Bergfeld Obfuscation** Chronic, ongoing with A1C with downward trend today at 6.7%.  Will continue diet focus at this time (wishes to start [place] program, discussed with her today), as she would prefer not to start medication, but is willing to if elevation consistent on checks.  Continue Olmesartan for kidney protection due to urine ALB 30 on check today, some improvement from previous of 80.  Continue to monitor BS at home daily and document + focus on diet.  Return in 6 months.

## 2020-09-01 NOTE — Assessment & Plan Note (Signed)
**Note De-Identified Carriero Obfuscation** Noted on CT 06/03/18.  Continue daily statin and ASA for prevention.

## 2020-09-01 NOTE — Assessment & Plan Note (Signed)
**Note De-Identified Pellegrini Obfuscation** BMI 31.66.  Has continue to lose weight.  Recommended eating smaller high protein, low fat meals more frequently and exercising 30 mins a day 5 times a week with a goal of 10-15lb weight loss in the next 3 months. Patient voiced their understanding and motivation to adhere to these recommendations.

## 2020-09-01 NOTE — Patient Instructions (Signed)
**Note De-identified Dede Obfuscation** Diabetes Mellitus and Nutrition, Adult When you have diabetes, or diabetes mellitus, it is very important to have healthy eating habits because your blood sugar (glucose) levels are greatly affected by what you eat and drink. Eating healthy foods in the right amounts, at about the same times every day, can help you:  Control your blood glucose.  Lower your risk of heart disease.  Improve your blood pressure.  Reach or maintain a healthy weight. What can affect my meal plan? Every person with diabetes is different, and each person has different needs for a meal plan. Your health care provider may recommend that you work with a dietitian to make a meal plan that is best for you. Your meal plan may vary depending on factors such as:  The calories you need.  The medicines you take.  Your weight.  Your blood glucose, blood pressure, and cholesterol levels.  Your activity level.  Other health conditions you have, such as heart or kidney disease. How do carbohydrates affect me? Carbohydrates, also called carbs, affect your blood glucose level more than any other type of food. Eating carbs naturally raises the amount of glucose in your blood. Carb counting is a method for keeping track of how many carbs you eat. Counting carbs is important to keep your blood glucose at a healthy level, especially if you use insulin or take certain oral diabetes medicines. It is important to know how many carbs you can safely have in each meal. This is different for every person. Your dietitian can help you calculate how many carbs you should have at each meal and for each snack. How does alcohol affect me? Alcohol can cause a sudden decrease in blood glucose (hypoglycemia), especially if you use insulin or take certain oral diabetes medicines. Hypoglycemia can be a life-threatening condition. Symptoms of hypoglycemia, such as sleepiness, dizziness, and confusion, are similar to symptoms of having too much  alcohol.  Do not drink alcohol if: ? Your health care provider tells you not to drink. ? You are pregnant, may be pregnant, or are planning to become pregnant.  If you drink alcohol: ? Do not drink on an empty stomach. ? Limit how much you use to:  0-1 drink a day for women.  0-2 drinks a day for men. ? Be aware of how much alcohol is in your drink. In the [place] one drink equals one 12 oz bottle of beer (355 mL), one 5 oz glass of wine (148 mL), or one 1 oz glass of hard liquor (44 mL). ? Keep yourself hydrated with water, diet soda, or unsweetened iced tea.  Keep in mind that regular soda, juice, and other mixers may contain a lot of sugar and must be counted as carbs. What are tips for following this plan? Reading food labels  Start by checking the serving size on the "Nutrition Facts" label of packaged foods and drinks. The amount of calories, carbs, fats, and other nutrients listed on the label is based on one serving of the item. Many items contain more than one serving per package.  Check the total grams (g) of carbs in one serving. You can calculate the number of servings of carbs in one serving by dividing the total carbs by 15. For example, if a food has 30 g of total carbs per serving, it would be equal to 2 servings of carbs.  Check the number of grams (g) of saturated fats and trans fats in one serving. Choose foods that have  **Note De-identified Wake Obfuscation** a low amount or none of these fats.  Check the number of milligrams (mg) of salt (sodium) in one serving. Most people should limit total sodium intake to less than 2,300 mg per day.  Always check the nutrition information of foods labeled as "low-fat" or "nonfat." These foods may be higher in added sugar or refined carbs and should be avoided.  Talk to your dietitian to identify your daily goals for nutrients listed on the label. Shopping  Avoid buying canned, pre-made, or processed foods. These foods tend to be high in fat, sodium, and added  sugar.  Shop around the outside edge of the grocery store. This is where you will most often find fresh fruits and vegetables, bulk grains, fresh meats, and fresh dairy. Cooking  Use low-heat cooking methods, such as baking, instead of high-heat cooking methods like deep frying.  Cook using healthy oils, such as olive, canola, or sunflower oil.  Avoid cooking with butter, cream, or high-fat meats. Meal planning  Eat meals and snacks regularly, preferably at the same times every day. Avoid going long periods of time without eating.  Eat foods that are high in fiber, such as fresh fruits, vegetables, beans, and whole grains. Talk with your dietitian about how many servings of carbs you can eat at each meal.  Eat 4-6 oz (112-168 g) of lean protein each day, such as lean meat, chicken, fish, eggs, or tofu. One ounce (oz) of lean protein is equal to: ? 1 oz (28 g) of meat, chicken, or fish. ? 1 egg. ?  cup (62 g) of tofu.  Eat some foods each day that contain healthy fats, such as avocado, nuts, seeds, and fish.   What foods should I eat? Fruits Berries. Apples. Oranges. Peaches. Apricots. Plums. Grapes. Mango. Papaya. Pomegranate. Kiwi. Cherries. Vegetables Lettuce. Spinach. Leafy greens, including kale, chard, collard greens, and mustard greens. Beets. Cauliflower. Cabbage. Broccoli. Carrots. Green beans. Tomatoes. Peppers. Onions. Cucumbers. Brussels sprouts. Grains Whole grains, such as whole-wheat or whole-grain bread, crackers, tortillas, cereal, and pasta. Unsweetened oatmeal. Quinoa. Brown or wild rice. Meats and other proteins Seafood. Poultry without skin. Lean cuts of poultry and beef. Tofu. Nuts. Seeds. Dairy Low-fat or fat-free dairy products such as milk, yogurt, and cheese. The items listed above may not be a complete list of foods and beverages you can eat. Contact a dietitian for more information. What foods should I avoid? Fruits Fruits canned with  syrup. Vegetables Canned vegetables. Frozen vegetables with butter or cream sauce. Grains Refined white flour and flour products such as bread, pasta, snack foods, and cereals. Avoid all processed foods. Meats and other proteins Fatty cuts of meat. Poultry with skin. Breaded or fried meats. Processed meat. Avoid saturated fats. Dairy Full-fat yogurt, cheese, or milk. Beverages Sweetened drinks, such as soda or iced tea. The items listed above may not be a complete list of foods and beverages you should avoid. Contact a dietitian for more information. Questions to ask a health care provider  Do I need to meet with a diabetes educator?  Do I need to meet with a dietitian?  What number can I call if I have questions?  When are the best times to check my blood glucose? Where to find more information:  American Diabetes Association: diabetes.org  Academy of Nutrition and Dietetics: www.eatright.org  National Institute of Diabetes and Digestive and Kidney Diseases: www.niddk.nih.gov  Association of Diabetes Care and Education Specialists: www.diabeteseducator.org Summary  It is important to have healthy eating  **Note De-identified Mannan Obfuscation** habits because your blood sugar (glucose) levels are greatly affected by what you eat and drink.  A healthy meal plan will help you control your blood glucose and maintain a healthy lifestyle.  Your health care provider may recommend that you work with a dietitian to make a meal plan that is best for you.  Keep in mind that carbohydrates (carbs) and alcohol have immediate effects on your blood glucose levels. It is important to count carbs and to use alcohol carefully. This information is not intended to replace advice given to you by your health care provider. Make sure you discuss any questions you have with your health care provider. Document Revised: 07/08/2019 Document Reviewed: 07/08/2019 Elsevier Patient Education  2021 Elsevier Inc.  

## 2020-09-02 ENCOUNTER — Encounter: Payer: Self-pay | Admitting: Nurse Practitioner

## 2020-09-02 LAB — CBC WITH DIFFERENTIAL/PLATELET
Basophils Absolute: 0 10*3/uL (ref 0.0–0.2)
Basos: 1 %
EOS (ABSOLUTE): 0.1 10*3/uL (ref 0.0–0.4)
Eos: 1 %
Hematocrit: 41.1 % (ref 34.0–46.6)
Hemoglobin: 13.7 g/dL (ref 11.1–15.9)
Immature Grans (Abs): 0 10*3/uL (ref 0.0–0.1)
Immature Granulocytes: 0 %
Lymphocytes Absolute: 2.4 10*3/uL (ref 0.7–3.1)
Lymphs: 28 %
MCH: 32 pg (ref 26.6–33.0)
MCHC: 33.3 g/dL (ref 31.5–35.7)
MCV: 96 fL (ref 79–97)
Monocytes Absolute: 0.7 10*3/uL (ref 0.1–0.9)
Monocytes: 8 %
Neutrophils Absolute: 5.4 10*3/uL (ref 1.4–7.0)
Neutrophils: 62 %
Platelets: 314 10*3/uL (ref 150–450)
RBC: 4.28 x10E6/uL (ref 3.77–5.28)
RDW: 11.7 % (ref 11.7–15.4)
WBC: 8.6 10*3/uL (ref 3.4–10.8)

## 2020-09-02 LAB — COMPREHENSIVE METABOLIC PANEL
ALT: 33 IU/L — ABNORMAL HIGH (ref 0–32)
AST: 29 IU/L (ref 0–40)
Albumin/Globulin Ratio: 1.4 (ref 1.2–2.2)
Albumin: 4.3 g/dL (ref 3.7–4.7)
Alkaline Phosphatase: 104 IU/L (ref 44–121)
BUN/Creatinine Ratio: 13 (ref 12–28)
BUN: 10 mg/dL (ref 8–27)
Bilirubin Total: 0.7 mg/dL (ref 0.0–1.2)
CO2: 22 mmol/L (ref 20–29)
Calcium: 9.6 mg/dL (ref 8.7–10.3)
Chloride: 103 mmol/L (ref 96–106)
Creatinine, Ser: 0.76 mg/dL (ref 0.57–1.00)
GFR calc Af Amer: 91 mL/min/{1.73_m2} (ref >59)
GFR calc non Af Amer: 79 mL/min/{1.73_m2} (ref >59)
Globulin, Total: 3 g/dL (ref 1.5–4.5)
Glucose: 129 mg/dL — ABNORMAL HIGH (ref 65–99)
Potassium: 4.5 mmol/L (ref 3.5–5.2)
Sodium: 140 mmol/L (ref 134–144)
Total Protein: 7.3 g/dL (ref 6.0–8.5)

## 2020-09-02 LAB — LIPID PANEL W/O CHOL/HDL RATIO
Cholesterol, Total: 150 mg/dL (ref 100–199)
HDL: 31 mg/dL — ABNORMAL LOW (ref >39)
LDL Chol Calc (NIH): 87 mg/dL (ref 0–99)
Triglycerides: 187 mg/dL — ABNORMAL HIGH (ref 0–149)
VLDL Cholesterol Cal: 32 mg/dL (ref 5–40)

## 2020-09-02 LAB — TSH: TSH: 2.35 u[IU]/mL (ref 0.450–4.500)

## 2020-09-02 NOTE — Progress Notes (Signed)
**Note De-identified Araiza Obfuscation** Letter out for labs 

## 2020-09-09 ENCOUNTER — Other Ambulatory Visit: Payer: Self-pay | Admitting: Nurse Practitioner

## 2020-09-09 ENCOUNTER — Other Ambulatory Visit: Payer: Self-pay

## 2020-09-09 ENCOUNTER — Ambulatory Visit
Admission: RE | Admit: 2020-09-09 | Discharge: 2020-09-09 | Disposition: A | Discharge status: Home / Self Care | Payer: Medicare Other | Source: Ambulatory Visit | Attending: Nurse Practitioner | Admitting: Nurse Practitioner

## 2020-09-09 DIAGNOSIS — R911 Solitary pulmonary nodule: Secondary | ICD-10-CM | POA: Diagnosis not present

## 2020-09-09 DIAGNOSIS — K753 Granulomatous hepatitis, not elsewhere classified: Secondary | ICD-10-CM | POA: Diagnosis not present

## 2020-09-09 DIAGNOSIS — I2510 Atherosclerotic heart disease of native coronary artery without angina pectoris: Secondary | ICD-10-CM | POA: Diagnosis not present

## 2020-09-09 DIAGNOSIS — J8410 Pulmonary fibrosis, unspecified: Secondary | ICD-10-CM | POA: Diagnosis not present

## 2020-09-09 DIAGNOSIS — I700 Atherosclerosis of aorta: Secondary | ICD-10-CM | POA: Diagnosis not present

## 2020-09-09 MED ORDER — DOXYCYCLINE HYCLATE 100 MG PO TABS: 100.0000 mg | ORAL_TABLET | Freq: Two times a day (BID) | ORAL | 0 refills | Status: AC

## 2020-09-09 MED ORDER — AZITHROMYCIN 250 MG PO TABS: ORAL_TABLET | ORAL | 0 refills | Status: DC

## 2020-09-09 NOTE — Progress Notes (Signed)
**Note De-Identified Bonifield Obfuscation** Good afternoon, please let Suzanne Jennings know her Ct has returned of lungs.  The past nodules are remaining stable and are considered benign in nature.  There is some sign of infection of inflammation to right upper and right lower lobes.  This could be related to your recent Covid infection -- possible pneumonia.  At this time due to recent infection I would like to treat as pneumonia -- I will send in two antibiotics I wish you to complete (Doxycycline and Azithromycin) then per imaging recommendations will repeat noncontrast CT in 3 months to ensure resolution of infection/inflammation.  Any questions? Keep being awesome!!  Thank you for allowing me to participate in your care. Kindest regards, Suzanne Jennings

## 2020-09-10 ENCOUNTER — Telehealth: Payer: Self-pay | Admitting: Nurse Practitioner

## 2020-09-10 ENCOUNTER — Other Ambulatory Visit: Payer: Self-pay | Admitting: Nurse Practitioner

## 2020-09-10 MED ORDER — ATORVASTATIN CALCIUM 80 MG PO TABS: 80.0000 mg | ORAL_TABLET | Freq: Every day | ORAL | 3 refills | Status: DC

## 2020-09-10 NOTE — Telephone Encounter (Signed)
**Note De-Identified Rohman Obfuscation** Called pt advised [facility], pt verbalized understanding

## 2020-09-10 NOTE — Telephone Encounter (Signed)
**Note De-Identified Cowens Obfuscation** Pt is calling to let jolene know she is ok with getting a new rx atorvastatin 80 mg. Pt does have 40 mg that she will take twice a day to equal 80 mg. Pt will need a new rx end of feb 2022 please [facility]

## 2020-09-10 NOTE — Telephone Encounter (Signed)
**Note De-Identified Kneebone Obfuscation** Wonderful, have sent script to [facility]

## 2020-10-25 ENCOUNTER — Other Ambulatory Visit: Payer: Self-pay

## 2020-12-09 ENCOUNTER — Telehealth: Payer: Self-pay | Admitting: Nurse Practitioner

## 2020-12-09 ENCOUNTER — Other Ambulatory Visit: Payer: Self-pay | Admitting: Nurse Practitioner

## 2020-12-09 MED ORDER — ONETOUCH VERIO FLEX SYSTEM W/DEVICE KIT: PACK | 1 refills | Status: AC

## 2020-12-09 NOTE — Telephone Encounter (Signed)
**Note De-identified Garciagarcia Obfuscation** Scheduled 7/19

## 2020-12-09 NOTE — Telephone Encounter (Signed)
**Note De-Identified Finder Obfuscation** Wrote and sent a new Editor, commissioning to [facility]
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
## 2020-12-10 NOTE — Telephone Encounter (Signed)
**Note De-Identified Vollmer Obfuscation** Left message for patient to notify that [facility] sent over new prescription for Verio to [facility]. Advised patient to give our office a call if she has any questions or concerns.

## 2020-12-13 NOTE — Telephone Encounter (Signed)
**Note De-identified Cunanan Obfuscation** Notified patient and pt verbalized understanding

## 2021-01-03 ENCOUNTER — Telehealth: Payer: Self-pay

## 2021-01-03 MED ORDER — ONETOUCH VERIO VI STRP: ORAL_STRIP | 12 refills | Status: AC

## 2021-01-03 NOTE — Telephone Encounter (Signed)
**Note De-identified Degidio Obfuscation** Done

## 2021-01-03 NOTE — Telephone Encounter (Signed)
**Note De-Identified Siefring Obfuscation** Copied from [place] [id number]. Topic: General - Other >> Jan 03, 2021 12:09 PM Tessa Lerner A wrote: Reason for CRM: Patient received a new Glucometer on Saturday 01/01/21  Patient would like a prescription for test strips for a OneTouch vario flex  Patient would like a prescription for test strips submitted to: [facility], [place].  Phone:  [phone] Fax:  [phone]  Please contact if needed

## 2021-02-01 ENCOUNTER — Telehealth: Payer: Self-pay | Admitting: Nurse Practitioner

## 2021-02-01 NOTE — Telephone Encounter (Signed)
**Note De-Identified Thrun Obfuscation** Copied from [place] [id number]. Topic: Medicare AWV >> Feb 01, 2021  1:13 PM Weston Anna wrote: Reason for CRM: left message need to reschedule AWV to February 18, 2021 at 11:15am due to completed by phone -srs

## 2021-02-09 ENCOUNTER — Ambulatory Visit: Payer: Medicare Other

## 2021-02-18 ENCOUNTER — Ambulatory Visit: Payer: Medicare Other

## 2021-02-27 ENCOUNTER — Encounter: Payer: Self-pay | Admitting: Nurse Practitioner

## 2021-02-27 DIAGNOSIS — K8020 Calculus of gallbladder without cholecystitis without obstruction: Secondary | ICD-10-CM | POA: Insufficient documentation

## 2021-02-28 ENCOUNTER — Ambulatory Visit: Payer: Medicare Other

## 2021-03-01 ENCOUNTER — Ambulatory Visit (INDEPENDENT_AMBULATORY_CARE_PROVIDER_SITE_OTHER): Payer: Medicare Other | Admitting: Nurse Practitioner

## 2021-03-01 ENCOUNTER — Other Ambulatory Visit: Payer: Self-pay

## 2021-03-01 ENCOUNTER — Encounter: Payer: Self-pay | Admitting: Nurse Practitioner

## 2021-03-01 DIAGNOSIS — I152 Hypertension secondary to endocrine disorders: Secondary | ICD-10-CM | POA: Diagnosis not present

## 2021-03-01 DIAGNOSIS — E6609 Other obesity due to excess calories: Secondary | ICD-10-CM

## 2021-03-01 DIAGNOSIS — K8020 Calculus of gallbladder without cholecystitis without obstruction: Secondary | ICD-10-CM | POA: Diagnosis not present

## 2021-03-01 DIAGNOSIS — E1159 Type 2 diabetes mellitus with other circulatory complications: Secondary | ICD-10-CM

## 2021-03-01 DIAGNOSIS — E785 Hyperlipidemia, unspecified: Secondary | ICD-10-CM

## 2021-03-01 DIAGNOSIS — I700 Atherosclerosis of aorta: Secondary | ICD-10-CM | POA: Diagnosis not present

## 2021-03-01 DIAGNOSIS — E1169 Type 2 diabetes mellitus with other specified complication: Secondary | ICD-10-CM

## 2021-03-01 DIAGNOSIS — R911 Solitary pulmonary nodule: Secondary | ICD-10-CM | POA: Diagnosis not present

## 2021-03-01 DIAGNOSIS — Z8542 Personal history of malignant neoplasm of other parts of uterus: Secondary | ICD-10-CM

## 2021-03-01 DIAGNOSIS — Z6831 Body mass index (BMI) 31.0-31.9, adult: Secondary | ICD-10-CM

## 2021-03-01 DIAGNOSIS — Z1231 Encounter for screening mammogram for malignant neoplasm of breast: Secondary | ICD-10-CM | POA: Diagnosis not present

## 2021-03-01 LAB — BAYER DCA HB A1C WAIVED: HB A1C (BAYER DCA - WAIVED): 7.8 % — ABNORMAL HIGH (ref <7.0)

## 2021-03-01 NOTE — Patient Instructions (Signed)
**Note De-identified Prabhakar Obfuscation** Diabetes Mellitus and Nutrition, Adult When you have diabetes, or diabetes mellitus, it is very important to have healthy eating habits because your blood sugar (glucose) levels are greatly affected by what you eat and drink. Eating healthy foods in the right amounts, at about the same times every day, can help you:  Control your blood glucose.  Lower your risk of heart disease.  Improve your blood pressure.  Reach or maintain a healthy weight. What can affect my meal plan? Every person with diabetes is different, and each person has different needs for a meal plan. Your health care provider may recommend that you work with a dietitian to make a meal plan that is best for you. Your meal plan may vary depending on factors such as:  The calories you need.  The medicines you take.  Your weight.  Your blood glucose, blood pressure, and cholesterol levels.  Your activity level.  Other health conditions you have, such as heart or kidney disease. How do carbohydrates affect me? Carbohydrates, also called carbs, affect your blood glucose level more than any other type of food. Eating carbs naturally raises the amount of glucose in your blood. Carb counting is a method for keeping track of how many carbs you eat. Counting carbs is important to keep your blood glucose at a healthy level, especially if you use insulin or take certain oral diabetes medicines. It is important to know how many carbs you can safely have in each meal. This is different for every person. Your dietitian can help you calculate how many carbs you should have at each meal and for each snack. How does alcohol affect me? Alcohol can cause a sudden decrease in blood glucose (hypoglycemia), especially if you use insulin or take certain oral diabetes medicines. Hypoglycemia can be a life-threatening condition. Symptoms of hypoglycemia, such as sleepiness, dizziness, and confusion, are similar to symptoms of having too much  alcohol.  Do not drink alcohol if: ? Your health care provider tells you not to drink. ? You are pregnant, may be pregnant, or are planning to become pregnant.  If you drink alcohol: ? Do not drink on an empty stomach. ? Limit how much you use to:  0-1 drink a day for women.  0-2 drinks a day for men. ? Be aware of how much alcohol is in your drink. In the [place] one drink equals one 12 oz bottle of beer (355 mL), one 5 oz glass of wine (148 mL), or one 1 oz glass of hard liquor (44 mL). ? Keep yourself hydrated with water, diet soda, or unsweetened iced tea.  Keep in mind that regular soda, juice, and other mixers may contain a lot of sugar and must be counted as carbs. What are tips for following this plan? Reading food labels  Start by checking the serving size on the "Nutrition Facts" label of packaged foods and drinks. The amount of calories, carbs, fats, and other nutrients listed on the label is based on one serving of the item. Many items contain more than one serving per package.  Check the total grams (g) of carbs in one serving. You can calculate the number of servings of carbs in one serving by dividing the total carbs by 15. For example, if a food has 30 g of total carbs per serving, it would be equal to 2 servings of carbs.  Check the number of grams (g) of saturated fats and trans fats in one serving. Choose foods that have  **Note De-identified Riebel Obfuscation** a low amount or none of these fats.  Check the number of milligrams (mg) of salt (sodium) in one serving. Most people should limit total sodium intake to less than 2,300 mg per day.  Always check the nutrition information of foods labeled as "low-fat" or "nonfat." These foods may be higher in added sugar or refined carbs and should be avoided.  Talk to your dietitian to identify your daily goals for nutrients listed on the label. Shopping  Avoid buying canned, pre-made, or processed foods. These foods tend to be high in fat, sodium, and added  sugar.  Shop around the outside edge of the grocery store. This is where you will most often find fresh fruits and vegetables, bulk grains, fresh meats, and fresh dairy. Cooking  Use low-heat cooking methods, such as baking, instead of high-heat cooking methods like deep frying.  Cook using healthy oils, such as olive, canola, or sunflower oil.  Avoid cooking with butter, cream, or high-fat meats. Meal planning  Eat meals and snacks regularly, preferably at the same times every day. Avoid going long periods of time without eating.  Eat foods that are high in fiber, such as fresh fruits, vegetables, beans, and whole grains. Talk with your dietitian about how many servings of carbs you can eat at each meal.  Eat 4-6 oz (112-168 g) of lean protein each day, such as lean meat, chicken, fish, eggs, or tofu. One ounce (oz) of lean protein is equal to: ? 1 oz (28 g) of meat, chicken, or fish. ? 1 egg. ?  cup (62 g) of tofu.  Eat some foods each day that contain healthy fats, such as avocado, nuts, seeds, and fish.   What foods should I eat? Fruits Berries. Apples. Oranges. Peaches. Apricots. Plums. Grapes. Mango. Papaya. Pomegranate. Kiwi. Cherries. Vegetables Lettuce. Spinach. Leafy greens, including kale, chard, collard greens, and mustard greens. Beets. Cauliflower. Cabbage. Broccoli. Carrots. Green beans. Tomatoes. Peppers. Onions. Cucumbers. Brussels sprouts. Grains Whole grains, such as whole-wheat or whole-grain bread, crackers, tortillas, cereal, and pasta. Unsweetened oatmeal. Quinoa. Brown or wild rice. Meats and other proteins Seafood. Poultry without skin. Lean cuts of poultry and beef. Tofu. Nuts. Seeds. Dairy Low-fat or fat-free dairy products such as milk, yogurt, and cheese. The items listed above may not be a complete list of foods and beverages you can eat. Contact a dietitian for more information. What foods should I avoid? Fruits Fruits canned with  syrup. Vegetables Canned vegetables. Frozen vegetables with butter or cream sauce. Grains Refined white flour and flour products such as bread, pasta, snack foods, and cereals. Avoid all processed foods. Meats and other proteins Fatty cuts of meat. Poultry with skin. Breaded or fried meats. Processed meat. Avoid saturated fats. Dairy Full-fat yogurt, cheese, or milk. Beverages Sweetened drinks, such as soda or iced tea. The items listed above may not be a complete list of foods and beverages you should avoid. Contact a dietitian for more information. Questions to ask a health care provider  Do I need to meet with a diabetes educator?  Do I need to meet with a dietitian?  What number can I call if I have questions?  When are the best times to check my blood glucose? Where to find more information:  American Diabetes Association: diabetes.org  Academy of Nutrition and Dietetics: www.eatright.org  National Institute of Diabetes and Digestive and Kidney Diseases: www.niddk.nih.gov  Association of Diabetes Care and Education Specialists: www.diabeteseducator.org Summary  It is important to have healthy eating  **Note De-identified Spickler Obfuscation** habits because your blood sugar (glucose) levels are greatly affected by what you eat and drink.  A healthy meal plan will help you control your blood glucose and maintain a healthy lifestyle.  Your health care provider may recommend that you work with a dietitian to make a meal plan that is best for you.  Keep in mind that carbohydrates (carbs) and alcohol have immediate effects on your blood glucose levels. It is important to count carbs and to use alcohol carefully. This information is not intended to replace advice given to you by your health care provider. Make sure you discuss any questions you have with your health care provider. Document Revised: 07/08/2019 Document Reviewed: 07/08/2019 Elsevier Patient Education  2021 Elsevier Inc.  

## 2021-03-01 NOTE — Assessment & Plan Note (Signed)
**Note De-Identified Carriero Obfuscation** Noted on CT 06/03/18.  Continue daily statin and ASA for prevention.

## 2021-03-01 NOTE — Assessment & Plan Note (Signed)
**Note De-Identified Hird Obfuscation** Noted on imaging, without symptoms, discussed with patient and will monitor.

## 2021-03-01 NOTE — Progress Notes (Signed)
**Note De-Identified Pat Obfuscation** BP 118/78 (BP Location: Left Arm)   Pulse 78   Temp 98.2 F (36.8 C) (Oral)   Wt 166 lb (75.3 kg)   LMP  (LMP Unknown)   SpO2 98%   BMI 31.89 kg/m    Subjective:    Patient ID: Suzanne Jennings, female    DOB: 1951-05-24, 70 y.o.   MRN: [id number]  HPI: Suzanne Jennings is a 70 y.o. female  Chief Complaint  Patient presents with   Diabetes   Hyperlipidemia   Hypertension   DIABETES Currently diet controlled.  Last A1C in January was 6.7%.  Has never taken medication for her diabetes.  She has been focused on diet and lost 13 pounds at last visit.  Continues to walk every night. Hypoglycemic episodes:no Polydipsia/polyuria: no Visual disturbance: no Chest pain: no Paresthesias: no Glucose Monitoring: yes             Accucheck frequency: every other day             Fasting glucose: 112 range             Post prandial:             Evening:             Before meals: Taking Insulin?: no             Long acting insulin:             Short acting insulin: Blood Pressure Monitoring: daily Retinal Examination: Up to Date  Foot Exam: Up to Date Pneumovax: refused Influenza: refused Aspirin: yes    HYPERTENSION / HYPERLIPIDEMIA Continues on Atorvastatin, Olmesartan, + ASA -- did not not take medicine yet this morning.   Has history of CT scan in 2019 noting aortic atherosclerosis -- has history on this CT of a 5 mm nodule with recommendation to repeat in 2 years -- repeat on 09/09/20 noted stable nodules, but some possible inflammation or infection with recommendations to repeat in 3 months to ensure stability. Recent scan also noted cholelithiasis.  Satisfied with current treatment? yes Duration of hypertension: chronic BP monitoring frequency: daily BP range: 130/72 this morning BP medication side effects: no Duration of hyperlipidemia: chronic Cholesterol medication side effects: no Cholesterol supplements: none Medication compliance: good compliance Aspirin: yes Recent  stressors: no Recurrent headaches: no Visual changes: no Palpitations: no Dyspnea: no Chest pain: no Lower extremity edema: no Dizzy/lightheaded: no    ENDOMETRIAL CANCER: History of endometrial cancer (grade 1 endometrial cancer with focal polyp) in June 2019, a complete hysterectomy was performed. No chemo or radiation.  Was followed by GYN, last visit 03/26/19 -- missed appointment scheduled with Dr. Fransisca Connors for 03/31/20.  Reports overall doing well and does not want any further internal exams.  Denies any symptoms.  Relevant past medical, surgical, family and social history reviewed and updated as indicated. Interim medical history since our last visit reviewed. Allergies and medications reviewed and updated.  Review of Systems  Constitutional:  Negative for activity change, appetite change, diaphoresis, fatigue and fever.  Respiratory:  Negative for cough, chest tightness and shortness of breath.   Cardiovascular:  Negative for chest pain, palpitations and leg swelling.  Gastrointestinal: Negative.   Endocrine: Negative for cold intolerance, heat intolerance, polydipsia, polyphagia and polyuria.  Neurological: Negative.   Psychiatric/Behavioral: Negative.     Per HPI unless specifically indicated above     Objective:    BP 118/78 (BP Location: Left Arm)   Pulse **Note De-Identified Guinyard Obfuscation** 78   Temp 98.2 F (36.8 C) (Oral)   Wt 166 lb (75.3 kg)   LMP  (LMP Unknown)   SpO2 98%   BMI 31.89 kg/m   Wt Readings from Last 3 Encounters:  03/01/21 166 lb (75.3 kg)  09/01/20 164 lb 12.8 oz (74.8 kg)  05/31/20 167 lb (75.8 kg)    Physical Exam Vitals and nursing note reviewed.  Constitutional:      General: She is awake. She is not in acute distress.    Appearance: She is well-developed and well-groomed. She is obese. She is not ill-appearing.  HENT:     Head: Normocephalic.     Right Ear: Hearing normal.     Left Ear: Hearing normal.  Eyes:     General: Lids are normal.        Right eye: No  discharge.        Left eye: No discharge.     Conjunctiva/sclera: Conjunctivae normal.     Pupils: Pupils are equal, round, and reactive to light.  Neck:     Thyroid: No thyromegaly.     Vascular: No carotid bruit.  Cardiovascular:     Rate and Rhythm: Normal rate and regular rhythm.     Heart sounds: Normal heart sounds. No murmur heard.   No gallop.  Pulmonary:     Effort: Pulmonary effort is normal. No accessory muscle usage or respiratory distress.     Breath sounds: Normal breath sounds.  Abdominal:     General: Bowel sounds are normal.     Palpations: Abdomen is soft.  Musculoskeletal:     Cervical back: Normal range of motion and neck supple.     Right lower leg: No edema.     Left lower leg: No edema.  Skin:    General: Skin is warm and dry.  Neurological:     Mental Status: She is alert and oriented to person, place, and time.  Psychiatric:        Attention and Perception: Attention normal.        Mood and Affect: Mood normal.        Speech: Speech normal.        Behavior: Behavior normal. Behavior is cooperative.        Thought Content: Thought content normal.   Diabetic Foot Exam - Simple   Simple Foot Form Visual Inspection No deformities, no ulcerations, no other skin breakdown bilaterally: Yes Sensation Testing Intact to touch and monofilament testing bilaterally: Yes Pulse Check Posterior Tibialis and Dorsalis pulse intact bilaterally: Yes Comments    Results for orders placed or performed in visit on 09/01/20  Bayer DCA Hb A1c Waived  Result Value Ref Range   HB A1C (BAYER DCA - WAIVED) 6.7 <7.0 %  Microalbumin, Urine Waived  Result Value Ref Range   Microalb, Ur Waived 30 (H) 0 - 19 mg/L   Creatinine, Urine Waived 100 10 - 300 mg/dL   Microalb/Creat Ratio <30 <30 mg/g  Comprehensive metabolic panel  Result Value Ref Range   Glucose 129 (H) 65 - 99 mg/dL   BUN 10 8 - 27 mg/dL   Creatinine, Ser 0.76 0.57 - 1.00 mg/dL   GFR calc non Af Amer 79  >59 mL/min/1.73   GFR calc Af Amer 91 >59 mL/min/1.73   BUN/Creatinine Ratio 13 12 - 28   Sodium 140 134 - 144 mmol/L   Potassium 4.5 3.5 - 5.2 mmol/L   Chloride 103 96 - 106 mmol/L **Note De-Identified Brabson Obfuscation** CO2 22 20 - 29 mmol/L   Calcium 9.6 8.7 - 10.3 mg/dL   Total Protein 7.3 6.0 - 8.5 g/dL   Albumin 4.3 3.7 - 4.7 g/dL   Globulin, Total 3.0 1.5 - 4.5 g/dL   Albumin/Globulin Ratio 1.4 1.2 - 2.2   Bilirubin Total 0.7 0.0 - 1.2 mg/dL   Alkaline Phosphatase 104 44 - 121 IU/L   AST 29 0 - 40 IU/L   ALT 33 (H) 0 - 32 IU/L  Lipid Panel w/o Chol/HDL Ratio  Result Value Ref Range   Cholesterol, Total 150 100 - 199 mg/dL   Triglycerides 187 (H) 0 - 149 mg/dL   HDL 31 (L) >39 mg/dL   VLDL Cholesterol Cal 32 5 - 40 mg/dL   LDL Chol Calc (NIH) 87 0 - 99 mg/dL  TSH  Result Value Ref Range   TSH 2.350 0.450 - 4.500 uIU/mL  CBC with Differential/Platelet  Result Value Ref Range   WBC 8.6 3.4 - 10.8 x10E3/uL   RBC 4.28 3.77 - 5.28 x10E6/uL   Hemoglobin 13.7 11.1 - 15.9 g/dL   Hematocrit 41.1 34.0 - 46.6 %   MCV 96 79 - 97 fL   MCH 32.0 26.6 - 33.0 pg   MCHC 33.3 31.5 - 35.7 g/dL   RDW 11.7 11.7 - 15.4 %   Platelets 314 150 - 450 x10E3/uL   Neutrophils 62 Not Estab. %   Lymphs 28 Not Estab. %   Monocytes 8 Not Estab. %   Eos 1 Not Estab. %   Basos 1 Not Estab. %   Neutrophils Absolute 5.4 1.4 - 7.0 x10E3/uL   Lymphocytes Absolute 2.4 0.7 - 3.1 x10E3/uL   Monocytes Absolute 0.7 0.1 - 0.9 x10E3/uL   EOS (ABSOLUTE) 0.1 0.0 - 0.4 x10E3/uL   Basophils Absolute 0.0 0.0 - 0.2 x10E3/uL   Immature Granulocytes 0 Not Estab. %   Immature Grans (Abs) 0.0 0.0 - 0.1 x10E3/uL      Assessment & Plan:   Problem List Items Addressed This Visit       Cardiovascular and Mediastinum   Hypertension associated with diabetes ([place])    Chronic, ongoing with initial BP elevated (often elevated on initial in office -- white coat), but repeat at goal and similar to her home BPs which are at goal.  Discussed with her goal BP  <130/80.  Continue Olmesartan at this time and adjust dose as needed, is tolerating well without ADR -- offers kidney protection with her diabetes.  Recommend she continue to monitor BP at home daily and document for provider + focus on DASH diet.  BMP today.  Return to office in 6 months.       Relevant Orders   Bayer DCA Hb A1c Waived   Basic metabolic panel   Aortic atherosclerosis ([place])    Noted on CT 06/03/18.  Continue daily statin and ASA for prevention.         Digestive   Cholelithiasis    Noted on imaging, without symptoms, discussed with patient and will monitor.         Endocrine   Hyperlipidemia associated with type 2 diabetes mellitus ([facility])    Chronic, ongoing.  Continue current medication regimen and adjust as needed.  Lipid panel today.       Relevant Orders   Bayer DCA Hb A1c Waived   Lipid Panel w/o Chol/HDL Ratio   Type 2 diabetes mellitus with morbid obesity ([place]) - Primary    Chronic, ongoing **Note De-Identified Daise Obfuscation** with A1C with upward trend to 7.8%, this is abnormal for her and sugars have been stable at home -- ?sample, will bring in next week for lab visit only and repeat this.  Will continue diet focus at this time as she would prefer not to start medication, but is willing to if elevation consistent on checks.  Continue Olmesartan for kidney protection due to urine ALB 12 September 2020, some improvement from previous of 80.  Continue to monitor BS at home daily and document + focus on diet.  Return in 6 months.       Relevant Orders   Bayer DCA Hb A1c Waived   Bayer DCA Hb A1c Waived     Other   Obesity    BMI 31.89.  Recommended eating smaller high protein, low fat meals more frequently and exercising 30 mins a day 5 times a week with a goal of 10-15lb weight loss in the next 3 months. Patient voiced their understanding and motivation to adhere to these recommendations.        History of endometrial cancer    Stable, continue collaboration with GYN -- at this time she  does not want any further internal exams performed and reports no symptoms present -- if symptoms present she agrees will notify GYN or PCP.       Lung nodule    Noted on CT 5 mm in 2019 with recommendation to repeat in 3 months.  Discussed at length with patient and will order repeat CT today.       Relevant Orders   CT CHEST WO CONTRAST   Other Visit Diagnoses     Encounter for screening mammogram for malignant neoplasm of breast       Mammogram ordered   Relevant Orders   MM 3D SCREEN BREAST BILATERAL        Follow up plan: Return in about 6 months (around 09/01/2021) for [place] with diabetes check -- due after 09/01/21.

## 2021-03-01 NOTE — Assessment & Plan Note (Addendum)
**Note De-identified Cooks Obfuscation** Stable, continue collaboration with GYN -- at this time she does not want any further internal exams performed and reports no symptoms present -- if symptoms present she agrees will notify GYN or PCP. 

## 2021-03-01 NOTE — Assessment & Plan Note (Signed)
**Note De-Identified Tuel Obfuscation** Chronic, ongoing with A1C with upward trend to 7.8%, this is abnormal for her and sugars have been stable at home -- ?sample, will bring in next week for lab visit only and repeat this.  Will continue diet focus at this time as she would prefer not to start medication, but is willing to if elevation consistent on checks.  Continue Olmesartan for kidney protection due to urine ALB 12 September 2020, some improvement from previous of 80.  Continue to monitor BS at home daily and document + focus on diet.  Return in 6 months.

## 2021-03-01 NOTE — Assessment & Plan Note (Signed)
**Note De-Identified Dygert Obfuscation** Noted on CT 5 mm in 2019 with recommendation to repeat in 3 months.  Discussed at length with patient and will order repeat CT today.

## 2021-03-01 NOTE — Assessment & Plan Note (Signed)
**Note De-Identified Evola Obfuscation** BMI 31.89.  Recommended eating smaller high protein, low fat meals more frequently and exercising 30 mins a day 5 times a week with a goal of 10-15lb weight loss in the next 3 months. Patient voiced their understanding and motivation to adhere to these recommendations.

## 2021-03-01 NOTE — Assessment & Plan Note (Signed)
**Note De-identified Tosi Obfuscation** Chronic, ongoing.  Continue current medication regimen and adjust as needed. Lipid panel today. 

## 2021-03-01 NOTE — Assessment & Plan Note (Signed)
**Note De-Identified Treloar Obfuscation** Chronic, ongoing with initial BP elevated (often elevated on initial in office -- white coat), but repeat at goal and similar to her home BPs which are at goal.  Discussed with her goal BP <130/80.  Continue Olmesartan at this time and adjust dose as needed, is tolerating well without ADR -- offers kidney protection with her diabetes.  Recommend she continue to monitor BP at home daily and document for provider + focus on DASH diet.  BMP today.  Return to office in 6 months.

## 2021-03-02 LAB — LIPID PANEL W/O CHOL/HDL RATIO
Cholesterol, Total: 153 mg/dL (ref 100–199)
HDL: 37 mg/dL — ABNORMAL LOW (ref >39)
LDL Chol Calc (NIH): 86 mg/dL (ref 0–99)
Triglycerides: 177 mg/dL — ABNORMAL HIGH (ref 0–149)
VLDL Cholesterol Cal: 30 mg/dL (ref 5–40)

## 2021-03-02 LAB — BASIC METABOLIC PANEL
BUN/Creatinine Ratio: 14 (ref 12–28)
BUN: 11 mg/dL (ref 8–27)
CO2: 24 mmol/L (ref 20–29)
Calcium: 9.8 mg/dL (ref 8.7–10.3)
Chloride: 99 mmol/L (ref 96–106)
Creatinine, Ser: 0.79 mg/dL (ref 0.57–1.00)
Glucose: 142 mg/dL — ABNORMAL HIGH (ref 65–99)
Potassium: 4.4 mmol/L (ref 3.5–5.2)
Sodium: 139 mmol/L (ref 134–144)
eGFR: 80 mL/min/{1.73_m2} (ref >59)

## 2021-03-02 NOTE — Progress Notes (Signed)
**Note De-Identified Telle[place]en Obfuscation** Good morning, please let Suzanne Jennings know her labs have returned and continue to be at baseline.  No changes needed.  Her LDL, bad cholesterol, remains in 80 range at 86.  Triglycerides a little elevated, please try taking a little fish oil daily.  Any questions? Keep being awesome!!  Thank you for allowing me to participate in your care.  I appreciate you. Kindest regards, Corda Shutt

## 2021-03-10 ENCOUNTER — Other Ambulatory Visit: Payer: Medicare Other

## 2021-03-14 ENCOUNTER — Ambulatory Visit
Admission: RE | Admit: 2021-03-14 | Discharge: 2021-03-14 | Disposition: A | Discharge status: Home / Self Care | Payer: Medicare Other | Source: Ambulatory Visit | Attending: Nurse Practitioner | Admitting: Nurse Practitioner

## 2021-03-14 ENCOUNTER — Other Ambulatory Visit: Payer: Self-pay

## 2021-03-14 DIAGNOSIS — R911 Solitary pulmonary nodule: Secondary | ICD-10-CM | POA: Insufficient documentation

## 2021-03-14 DIAGNOSIS — R918 Other nonspecific abnormal finding of lung field: Secondary | ICD-10-CM | POA: Diagnosis not present

## 2021-03-14 DIAGNOSIS — I700 Atherosclerosis of aorta: Secondary | ICD-10-CM | POA: Diagnosis not present

## 2021-03-15 ENCOUNTER — Other Ambulatory Visit: Payer: Medicare Other

## 2021-03-15 DIAGNOSIS — E1169 Type 2 diabetes mellitus with other specified complication: Secondary | ICD-10-CM | POA: Diagnosis not present

## 2021-03-15 LAB — BAYER DCA HB A1C WAIVED: HB A1C (BAYER DCA - WAIVED): 7.5 % — ABNORMAL HIGH (ref <7.0)

## 2021-03-28 ENCOUNTER — Ambulatory Visit
Admission: RE | Admit: 2021-03-28 | Discharge: 2021-03-28 | Disposition: A | Discharge status: Home / Self Care | Payer: Medicare Other | Source: Ambulatory Visit | Attending: Nurse Practitioner | Admitting: Nurse Practitioner

## 2021-03-28 ENCOUNTER — Other Ambulatory Visit: Payer: Self-pay

## 2021-03-28 DIAGNOSIS — Z1231 Encounter for screening mammogram for malignant neoplasm of breast: Secondary | ICD-10-CM | POA: Diagnosis not present

## 2021-07-14 ENCOUNTER — Other Ambulatory Visit: Payer: Self-pay | Admitting: Nurse Practitioner

## 2021-07-16 NOTE — Telephone Encounter (Signed)
**Note De-Identified Walle Obfuscation** Requested Prescriptions  Pending Prescriptions Disp Refills  . atorvastatin (LIPITOR) 80 MG tablet [Pharmacy Med Name: Atorvastatin Calcium 80 MG Oral Tablet] 90 tablet 1    Sig: TAKE 1 TABLET BY MOUTH  DAILY     Cardiovascular:  Antilipid - Statins Failed - 07/14/2021 11:18 PM      Failed - HDL in normal range and within 360 days    HDL  Date Value Ref Range Status  03/01/2021 37 (L) >39 mg/dL Final         Failed - Triglycerides in normal range and within 360 days    Triglycerides  Date Value Ref Range Status  03/01/2021 177 (H) 0 - 149 mg/dL Final   Triglycerides Piccolo,Waived  Date Value Ref Range Status  06/29/2017 179 (H) <150 mg/dL Final    Comment:                            Normal                   <150                         Borderline High     150 - 199                         High                200 - 499                         Very High                >499          Passed - Total Cholesterol in normal range and within 360 days    Cholesterol, Total  Date Value Ref Range Status  03/01/2021 153 100 - 199 mg/dL Final   Cholesterol Piccolo, Waived  Date Value Ref Range Status  06/29/2017 169 <200 mg/dL Final    Comment:                            Desirable                <200                         Borderline High      200- 239                         High                     >239          Passed - LDL in normal range and within 360 days    LDL Chol Calc (NIH)  Date Value Ref Range Status  03/01/2021 86 0 - 99 mg/dL Final         Passed - Patient is not pregnant      Passed - Valid encounter within last 12 months    Recent Outpatient Visits          4 months ago Type 2 diabetes mellitus with morbid obesity ([place])   [place], Jolene T, NP   10 months ago Hypertension associated with diabetes ( **Note De-Identified [place] Obfuscation** [place])   [place], [place] T, NP   1 year ago Type 2 diabetes mellitus with morbid obesity ([place])   [place], [place] T, NP   1 year ago Hypertension associated with diabetes ([place])   [place], Jolene T, NP   1 year ago Type 2 diabetes mellitus with morbid obesity ([place])   [place], Barbaraann Faster, NP      Future Appointments            In 1 month [place], Barbaraann Faster, NP [facility]   In 7 months  [facility]

## 2021-08-02 ENCOUNTER — Other Ambulatory Visit: Payer: Self-pay | Admitting: Nurse Practitioner

## 2021-08-03 DIAGNOSIS — E119 Type 2 diabetes mellitus without complications: Secondary | ICD-10-CM | POA: Diagnosis not present

## 2021-08-03 LAB — HM DIABETES EYE EXAM

## 2021-08-03 NOTE — Telephone Encounter (Signed)
**Note De-Identified Rawling Obfuscation** Requested Prescriptions  Pending Prescriptions Disp Refills   olmesartan (BENICAR) 20 MG tablet [Pharmacy Med Name: Olmesartan Medoxomil 20 MG Oral Tablet] 90 tablet 3    Sig: TAKE 1 TABLET BY MOUTH  DAILY     Cardiovascular:  Angiotensin Receptor Blockers Passed - 08/02/2021 10:09 PM      Passed - Cr in normal range and within 180 days    Creatinine, Ser  Date Value Ref Range Status  03/01/2021 0.79 0.57 - 1.00 mg/dL Final         Passed - K in normal range and within 180 days    Potassium  Date Value Ref Range Status  03/01/2021 4.4 3.5 - 5.2 mmol/L Final         Passed - Patient is not pregnant      Passed - Last BP in normal range    BP Readings from Last 1 Encounters:  03/01/21 118/78         Passed - Valid encounter within last 6 months    Recent Outpatient Visits          5 months ago Type 2 diabetes mellitus with morbid obesity ([place])   [place], Jolene T, NP   11 months ago Hypertension associated with diabetes ([place])   [place], [place] T, NP   1 year ago Type 2 diabetes mellitus with morbid obesity ([place])   [place], [place] T, NP   1 year ago Hypertension associated with diabetes ([place])   [place], Jolene T, NP   1 year ago Type 2 diabetes mellitus with morbid obesity ([place])   [place], Barbaraann Faster, NP      Future Appointments            In 6 days  [facility] [place]   In 1 month Cannady, Barbaraann Faster, NP [facility]

## 2021-08-09 ENCOUNTER — Ambulatory Visit (INDEPENDENT_AMBULATORY_CARE_PROVIDER_SITE_OTHER): Payer: Medicare Other | Admitting: *Deleted

## 2021-08-09 DIAGNOSIS — Z0000 Encounter for general adult medical examination without abnormal findings: Secondary | ICD-10-CM | POA: Diagnosis not present

## 2021-08-09 NOTE — Progress Notes (Signed)
**Note De-Identified Mogle Obfuscation** Subjective:   Suzanne Jennings is a 70 y.o. female who presents for Medicare Annual (Subsequent) preventive examination.  I connected with  [name] on 08/09/21 by a video enabled telemedicine application and verified that I am speaking with the correct person using two identifiers.   I discussed the limitations of evaluation and management by telemedicine. The patient expressed understanding and agreed to proceed.  Patient location: home  Provider location: Tele-Health  not in office    Review of Systems     Cardiac Risk Factors include: advanced age (>50mn, >>25women);hypertension;obesity (BMI >30kg/m2)     Objective:    Today's Vitals   There is no height or weight on file to calculate BMI.  Advanced Directives 08/09/2021 02/09/2020 10/02/2018 03/20/2018 01/30/2018 03/29/2017 03/12/2017  Does Patient Have a Medical Advance Directive? No Yes _0   Type of Advance Directive - [place] - - - - -  Copy of [place]? - No - copy requested - - - - -  Would patient like information on creating a medical advance directive? No - Patient declined - No - Patient declined No - Patient declined Yes (MAU/Ambulatory/Procedural Areas - Information given) No - Patient declined No - Patient declined    Current Medications (verified) Outpatient Encounter Medications as of 08/09/2021  Medication Sig   aspirin EC 81 MG tablet Take 1 tablet (81 mg total) by mouth daily.   atorvastatin (LIPITOR) 80 MG tablet TAKE 1 TABLET BY MOUTH  DAILY   Blood Glucose Monitoring Suppl (OBelle [place] w/Device KIT Use to check blood sugar 3-4 times a day and document for provider visits.  Goal less then 130 in morning fasting and less then 180 two hours after a meal.   cholecalciferol (VITAMIN D3) 25 MCG (1000 UNIT) tablet Take 1,000 Units by mouth daily.   losartan (COZAAR) 25 MG tablet Take 25 mg by mouth daily. 1 time daily    ONETOUCH VERIO test strip Use to check sugar 2-3 times daily.   OVER THE COUNTER MEDICATION Take 1 capsule by mouth daily. Zendocrine otc supplement   OVER THE COUNTER MEDICATION 2 tablets daily. Red Krill 3000 mg   Probiotic Product (PROBIOTIC PO) Take 1 capsule by mouth daily.    olmesartan (BENICAR) 20 MG tablet TAKE 1 TABLET BY MOUTH  DAILY (Patient not taking: Reported on 08/09/2021)   No facility-administered encounter medications on file as of 08/09/2021.    Allergies (verified) Aspirin, Other, Penicillins, and Sulfa antibiotics   History: Past Medical History:  Diagnosis Date   Diabetes mellitus without complication ([place]    Endometrial cancer ([place]    History of kidney stones    Hyperlipidemia    Hypertension    Kidney stone    Knee pain    Past Surgical History:  Procedure Laterality Date   APPENDECTOMY     COLONOSCOPY WITH PROPOFOL N/A 07/03/2016   Procedure: COLONOSCOPY WITH PROPOFOL;  Surgeon: DLucilla Lame MD;  [facility]  Service: Endoscopy;  Laterality: N/A;   LAPAROSCOPIC HYSTERECTOMY N/A 02/20/2018   Procedure: HYSTERECTOMY TOTAL LAPAROSCOPIC, WITH BILATERAL SALPINGOOPHERECTOMY;  Surgeon: BMellody Drown MD;  Location: [facility];  Service: Gynecology;  Laterality: N/A;   POLYPECTOMY  07/03/2016   Procedure: POLYPECTOMY;  Surgeon: DLucilla Lame MD;  [place] [facility]  Service: Endoscopy;;   SENTINEL NODE BIOPSY N/A 02/20/2018   Procedure: SENTINEL NODE BIOPSY AND MAPPING;  Surgeon: **Note De-Identified Rudie Obfuscation** Mellody Drown, MD;  Location: [facility];  Service: Gynecology;  Laterality: N/A;   TUBAL LIGATION     Family History  Problem Relation Age of Onset   Emphysema Mother    Heart disease Father        massive MI   Hypertension Sister    Mental illness Brother    Hypertension Sister    Hypertension Sister    Diabetes Maternal Grandmother    Breast cancer Neg Hx    Social History   Socioeconomic History   Marital status: Married    Spouse name:  Not on file   Number of children: Not on file   Years of education: Not on file   Highest education level: Not on file  Occupational History   Occupation: retired  Tobacco Use   Smoking status: Never   Smokeless tobacco: Never  Vaping Use   Vaping Use: Never used  Substance and Sexual Activity   Alcohol use: No    Alcohol/week: 0.0 standard drinks   Drug use: No   Sexual activity: Yes    Birth control/protection: Post-menopausal  Other Topics Concern   Not on file  Social History Narrative   Not on file   Social Determinants of Health   Financial Resource Strain: Low Risk    Difficulty of Paying Living Expenses: Not hard at all  Food Insecurity: No Food Insecurity   Worried About Charity fundraiser in the Last Year: Never true   [place] in the Last Year: Never true  Transportation Needs: No Transportation Needs   Lack of Transportation (Medical): No   Lack of Transportation (Non-Medical): No  Physical Activity: Sufficiently Active   Days of Exercise per Week: 4 days   Minutes of Exercise per Session: 50 min  Stress: No Stress Concern Present   Feeling of Stress : Not at all  Social Connections: Socially Integrated   Frequency of Communication with Friends and Family: More than three times a week   Frequency of Social Gatherings with Friends and Family: More than three times a week   Attends Religious Services: More than 4 times per year   Active Member of [employer] or Organizations: Yes   Attends Music therapist: More than 4 times per year   Marital Status: Married    Tobacco Counseling Counseling given: Not Answered   Clinical Intake:  Pre-visit preparation completed: Yes  Pain : No/denies pain     Nutritional Risks: None Diabetes: No  How often do you need to have someone help you when you read instructions, pamphlets, or other written materials from your doctor or pharmacy?: 1 - Never  Diabetic?  Pre diabetic  Checks blood 2 x  daily  Interpreter Needed?: No  Information entered by :: Leroy Kennedy LPN   Activities of Daily Living In your present state of health, do you have any difficulty performing the following activities: 08/09/2021 09/01/2020  Hearing? N N  Vision? N N  Difficulty concentrating or making decisions? N N  Walking or climbing stairs? N N  Dressing or bathing? N N  Doing errands, shopping? N N  Preparing Food and eating ? N -  Using the Toilet? N -  In the past six months, have you accidently leaked urine? N -  Do you have problems with loss of bowel control? N -  Managing your Medications? N -  Managing your Finances? N -  Housekeeping or managing your Housekeeping? N -  Some **Note De-Identified Corter Obfuscation** recent data might be hidden    Patient Care Team: Venita Lick, NP as PCP - General (Nurse Practitioner) Clent Jacks, RN as Registered Nurse  Indicate any recent Medical Services you may have received from other than [facility] providers in the past year (date may be approximate).     Assessment:   This is a routine wellness examination for Tnia.  Hearing/Vision screen Hearing Screening - Comments:: No trouble hearing Vision Screening - Comments:: Up to date [facility]  Dietary issues and exercise activities discussed: Current Exercise Habits: Structured exercise class, Time (Minutes): 50, Frequency (Times/Week): 4, Weekly Exercise (Minutes/Week): 200, Intensity: Moderate   Goals Addressed             This Visit's Progress    Patient Stated       Loose weight       Depression Screen PHQ 2/9 Scores 08/09/2021 09/01/2020 02/09/2020 06/13/2018 03/29/2017 03/12/2017 09/21/2015  PHQ - 2 Score 0 0 0 0 0 0 0  PHQ- 9 Score - - - 0 - 0 -    Fall Risk Fall Risk  08/09/2021 09/01/2020 02/25/2020 02/09/2020 06/13/2018  Falls in the past year? 0 1 0 0 No  Comment - - - - -  Number falls in past yr: 0 0 0 - -  Injury with Fall? 0 0 0 - -  Risk for fall due to : - Impaired mobility - Medication side  effect -  Follow up Falls evaluation completed;Falls prevention discussed;Education provided Falls evaluation completed;Falls prevention discussed Education provided Falls evaluation completed;Education provided;Falls prevention discussed -    FALL RISK PREVENTION PERTAINING TO THE HOME:  Any stairs in or around the home? No  If so, are there any without handrails? No  Home free of loose throw rugs in walkways, pet beds, electrical cords, etc? Yes  Adequate lighting in your home to reduce risk of falls? Yes   ASSISTIVE DEVICES UTILIZED TO PREVENT FALLS:  Life alert? No  Use of a cane, walker or w/c? No  Grab bars in the bathroom? Yes  Shower chair or bench in shower? No  Elevated toilet seat or a handicapped toilet? No   TIMED UP AND GO:  Was the test performed? No .    Cognitive Function:  Normal cognitive status assessed by direct observation by this Nurse Health Advisor. No abnormalities found.       6CIT Screen 02/09/2020  What Year? 0 points  What month? 0 points  What time? 0 points  Count back from 20 0 points  Months in reverse 0 points  Repeat phrase 4 points  Total Score 4    Immunizations Immunization History  Administered Date(s) Administered   PFIZER(Purple Top)SARS-COV-2 Vaccination 12/09/2019, 12/30/2019   Td 09/17/2015    TDAP status: Up to date  Flu Vaccine status: Declined, Education has been provided regarding the importance of this vaccine but patient still declined. Advised may receive this vaccine at local pharmacy or Health Dept. Aware to provide a copy of the vaccination record if obtained from local pharmacy or Health Dept. Verbalized acceptance and understanding.  Pneumococcal vaccine status: Declined,  Education has been provided regarding the importance of this vaccine but patient still declined. Advised may receive this vaccine at local pharmacy or Health Dept. Aware to provide a copy of the vaccination record if obtained from local  pharmacy or Health Dept. Verbalized acceptance and understanding.   Covid-19 vaccine status: Information provided on how to obtain vaccines. **Note De-Identified Hasty Obfuscation** Qualifies for Shingles Vaccine? No   Zostavax completed No   Shingrix Completed?: No.    Education has been provided regarding the importance of this vaccine. Patient has been advised to call insurance company to determine out of pocket expense if they have not yet received this vaccine. Advised may also receive vaccine at local pharmacy or Health Dept. Verbalized acceptance and understanding.  Screening Tests Health Maintenance  Topic Date Due   COVID-19 Vaccine (3 - Pfizer risk series) 01/27/2020   COLONOSCOPY (Pts 45-35yr Insurance coverage will need to be confirmed)  07/03/2021   OPHTHALMOLOGY EXAM  07/29/2021   Zoster Vaccines- Shingrix (1 of 2) 11/07/2021 (Originally 04/23/1970)   INFLUENZA VACCINE  11/11/2021 (Originally 03/14/2021)   Pneumonia Vaccine 72 Years old (1 - PCV) 08/09/2022 (Originally 04/24/1957)   HEMOGLOBIN A1C  09/15/2021   FOOT EXAM  03/01/2022   MAMMOGRAM  03/29/2023   TETANUS/TDAP  09/16/2025   DEXA SCAN  Completed   Hepatitis C Screening  Completed   HPV VACCINES  Aged Out    Health Maintenance  Health Maintenance Due  Topic Date Due   COVID-19 Vaccine (3 - Pfizer risk series) 01/27/2020   COLONOSCOPY (Pts 45-473yrInsurance coverage will need to be confirmed)  07/03/2021   OPHTHALMOLOGY EXAM  07/29/2021    Colorectal cancer screening: Type of screening: Colonoscopy. Completed 2017. Repeat every 10 years  Mammogram status: Completed  . Repeat every year  Bone Density completed 2017  Lung Cancer Screening: (Low Dose CT Chest recommended if Age 72-80ears, 30 pack-year currently smoking OR have quit w/in 15years.) does not qualify.   Lung Cancer Screening Referral:   Additional Screening:  Hepatitis C Screening: does not qualify; Completed 2016  Vision Screening: Recommended annual ophthalmology exams  for early detection of glaucoma and other disorders of the eye. Is the patient up to date with their annual eye exam?  Yes  Who is the provider or what is the name of the office in which the patient attends annual eye exams? [facility] If pt is not established with a provider, would they like to be referred to a provider to establish care? No .   Dental Screening: Recommended annual dental exams for proper oral hygiene  Community Resource Referral / Chronic Care Management: CRR required this visit?  No   CCM required this visit?  No      Plan:     I have personally reviewed and noted the following in the patients chart:   Medical and social history Use of alcohol, tobacco or illicit drugs  Current medications and supplements including opioid prescriptions.  Functional ability and status Nutritional status Physical activity Advanced directives List of other physicians Hospitalizations, surgeries, and ER visits in previous 12 months Vitals Screenings to include cognitive, depression, and falls Referrals and appointments  In addition, I have reviewed and discussed with patient certain preventive protocols, quality metrics, and best practice recommendations. A written personalized care plan for preventive services as well as general preventive health recommendations were provided to patient.     JuLeroy KennedyLPN   [id number] Nurse Notes:

## 2021-08-09 NOTE — Patient Instructions (Signed)
**Note De-Identified Forero Obfuscation** [name] , Thank you for taking time to come for your Medicare Wellness Visit. I appreciate your ongoing commitment to your health goals. Please review the following plan we discussed and let me know if I can assist you in the future.   Screening recommendations/referrals: Colonoscopy: up to date Mammogram: up to date Bone Density: up to date Recommended yearly ophthalmology/optometry visit for glaucoma screening and checkup Recommended yearly dental visit for hygiene and checkup  Vaccinations: Influenza vaccine: Education provided Pneumococcal vaccine: Education provided Tdap vaccine: up to date Shingles vaccine: Education provided    Advanced directives: Education provided  Conditions/risks identified:   Next appointment1-26-2023 @ 8:00 [place] 65 Years and Older, Female Preventive care refers to lifestyle choices and visits with your health care provider that can promote health and wellness. What does preventive care include? A yearly physical exam. This is also called an annual well check. Dental exams once or twice a year. Routine eye exams. Ask your health care provider how often you should have your eyes checked. Personal lifestyle choices, including: Daily care of your teeth and gums. Regular physical activity. Eating a healthy diet. Avoiding tobacco and drug use. Limiting alcohol use. Practicing safe sex. Taking low-dose aspirin every day. Taking vitamin and mineral supplements as recommended by your health care provider. What happens during an annual well check? The services and screenings done by your health care provider during your annual well check will depend on your age, overall health, lifestyle risk factors, and family history of disease. Counseling  Your health care provider may ask you questions about your: Alcohol use. Tobacco use. Drug use. Emotional well-being. Home and relationship well-being. Sexual activity. Eating  habits. History of falls. Memory and ability to understand (cognition). Work and work Statistician. Reproductive health. Screening  You may have the following tests or measurements: Height, weight, and BMI. Blood pressure. Lipid and cholesterol levels. These may be checked every 5 years, or more frequently if you are over 28 years old. Skin check. Lung cancer screening. You may have this screening every year starting at age 87 if you have a 30-pack-year history of smoking and currently smoke or have quit within the past 15 years. Fecal occult blood test (FOBT) of the stool. You may have this test every year starting at age 73. Flexible sigmoidoscopy or colonoscopy. You may have a sigmoidoscopy every 5 years or a colonoscopy every 10 years starting at age 91. Hepatitis C blood test. Hepatitis B blood test. Sexually transmitted disease (STD) testing. Diabetes screening. This is done by checking your blood sugar (glucose) after you have not eaten for a while (fasting). You may have this done every 1-3 years. Bone density scan. This is done to screen for osteoporosis. You may have this done starting at age 71. Mammogram. This may be done every 1-2 years. Talk to your health care provider about how often you should have regular mammograms. Talk with your health care provider about your test results, treatment options, and if necessary, the need for more tests. Vaccines  Your health care provider may recommend certain vaccines, such as: Influenza vaccine. This is recommended every year. Tetanus, diphtheria, and acellular pertussis (Tdap, Td) vaccine. You may need a Td booster every 10 years. Zoster vaccine. You may need this after age 36. Pneumococcal 13-valent conjugate (PCV13) vaccine. One dose is recommended after age 46. Pneumococcal polysaccharide (PPSV23) vaccine. One dose is recommended after age 10. Talk to your health care provider about which screenings **Note De-Identified Crosson Obfuscation** and vaccines you need and how  often you need them. This information is not intended to replace advice given to you by your health care provider. Make sure you discuss any questions you have with your health care provider. Document Released: 08/27/2015 Document Revised: 04/19/2016 Document Reviewed: 06/01/2015 Elsevier Interactive Patient Education  2017 [place] Prevention in the Home Falls can cause injuries. They can happen to people of all ages. There are many things you can do to make your home safe and to help prevent falls. What can I do on the outside of my home? Regularly fix the edges of walkways and driveways and fix any cracks. Remove anything that might make you trip as you walk through a door, such as a raised step or threshold. Trim any bushes or trees on the path to your home. Use bright outdoor lighting. Clear any walking paths of anything that might make someone trip, such as rocks or tools. Regularly check to see if handrails are loose or broken. Make sure that both sides of any steps have handrails. Any raised decks and porches should have guardrails on the edges. Have any leaves, snow, or ice cleared regularly. Use sand or salt on walking paths during winter. Clean up any spills in your garage right away. This includes oil or grease spills. What can I do in the bathroom? Use night lights. Install grab bars by the toilet and in the tub and shower. Do not use towel bars as grab bars. Use non-skid mats or decals in the tub or shower. If you need to sit down in the shower, use a plastic, non-slip stool. Keep the floor dry. Clean up any water that spills on the floor as soon as it happens. Remove soap buildup in the tub or shower regularly. Attach bath mats securely with double-sided non-slip rug tape. Do not have throw rugs and other things on the floor that can make you trip. What can I do in the bedroom? Use night lights. Make sure that you have a light by your bed that is easy to  reach. Do not use any sheets or blankets that are too big for your bed. They should not hang down onto the floor. Have a firm chair that has side arms. You can use this for support while you get dressed. Do not have throw rugs and other things on the floor that can make you trip. What can I do in the kitchen? Clean up any spills right away. Avoid walking on wet floors. Keep items that you use a lot in easy-to-reach places. If you need to reach something above you, use a strong step stool that has a grab bar. Keep electrical cords out of the way. Do not use floor polish or wax that makes floors slippery. If you must use wax, use non-skid floor wax. Do not have throw rugs and other things on the floor that can make you trip. What can I do with my stairs? Do not leave any items on the stairs. Make sure that there are handrails on both sides of the stairs and use them. Fix handrails that are broken or loose. Make sure that handrails are as long as the stairways. Check any carpeting to make sure that it is firmly attached to the stairs. Fix any carpet that is loose or worn. Avoid having throw rugs at the top or bottom of the stairs. If you do have throw rugs, attach them to the floor with carpet tape. **Note De-Identified [place] Obfuscation** Make sure that you have a light switch at the top of the stairs and the bottom of the stairs. If you do not have them, ask someone to add them for you. What else can I do to help prevent falls? Wear shoes that: Do not have high heels. Have rubber bottoms. Are comfortable and fit you well. Are closed at the toe. Do not wear sandals. If you use a stepladder: Make sure that it is fully opened. Do not climb a closed stepladder. Make sure that both sides of the stepladder are locked into place. Ask someone to hold it for you, if possible. Clearly mark and make sure that you can see: Any grab bars or handrails. First and last steps. Where the edge of each step is. Use tools that help you move  around (mobility aids) if they are needed. These include: Canes. Walkers. Scooters. Crutches. Turn on the lights when you go into a dark area. Replace any light bulbs as soon as they burn out. Set up your furniture so you have a clear path. Avoid moving your furniture around. If any of your floors are uneven, fix them. If there are any pets around you, be aware of where they are. Review your medicines with your doctor. Some medicines can make you feel dizzy. This can increase your chance of falling. Ask your doctor what other things that you can do to help prevent falls. This information is not intended to replace advice given to you by your health care provider. Make sure you discuss any questions you have with your health care provider. Document Released: 05/27/2009 Document Revised: 01/06/2016 Document Reviewed: 09/04/2014 Elsevier Interactive Patient Education  2017 [employer].

## 2021-09-08 ENCOUNTER — Encounter: Payer: Medicare Other | Admitting: Nurse Practitioner

## 2021-09-08 DIAGNOSIS — I700 Atherosclerosis of aorta: Secondary | ICD-10-CM

## 2021-09-08 DIAGNOSIS — Z8542 Personal history of malignant neoplasm of other parts of uterus: Secondary | ICD-10-CM

## 2021-09-08 DIAGNOSIS — Z0000 Encounter for general adult medical examination without abnormal findings: Secondary | ICD-10-CM

## 2021-09-08 DIAGNOSIS — E559 Vitamin D deficiency, unspecified: Secondary | ICD-10-CM

## 2021-09-08 DIAGNOSIS — E1159 Type 2 diabetes mellitus with other circulatory complications: Secondary | ICD-10-CM

## 2021-09-08 DIAGNOSIS — E1169 Type 2 diabetes mellitus with other specified complication: Secondary | ICD-10-CM

## 2021-09-08 DIAGNOSIS — E6609 Other obesity due to excess calories: Secondary | ICD-10-CM

## 2021-09-27 ENCOUNTER — Ambulatory Visit (INDEPENDENT_AMBULATORY_CARE_PROVIDER_SITE_OTHER): Payer: Medicare Other | Admitting: Nurse Practitioner

## 2021-09-27 ENCOUNTER — Other Ambulatory Visit: Payer: Self-pay

## 2021-09-27 ENCOUNTER — Encounter: Payer: Self-pay | Admitting: Nurse Practitioner

## 2021-09-27 DIAGNOSIS — Z8542 Personal history of malignant neoplasm of other parts of uterus: Secondary | ICD-10-CM | POA: Diagnosis not present

## 2021-09-27 DIAGNOSIS — E559 Vitamin D deficiency, unspecified: Secondary | ICD-10-CM

## 2021-09-27 DIAGNOSIS — E1169 Type 2 diabetes mellitus with other specified complication: Secondary | ICD-10-CM

## 2021-09-27 DIAGNOSIS — E1159 Type 2 diabetes mellitus with other circulatory complications: Secondary | ICD-10-CM | POA: Diagnosis not present

## 2021-09-27 DIAGNOSIS — E785 Hyperlipidemia, unspecified: Secondary | ICD-10-CM

## 2021-09-27 DIAGNOSIS — I700 Atherosclerosis of aorta: Secondary | ICD-10-CM | POA: Diagnosis not present

## 2021-09-27 DIAGNOSIS — E6609 Other obesity due to excess calories: Secondary | ICD-10-CM

## 2021-09-27 DIAGNOSIS — Z0000 Encounter for general adult medical examination without abnormal findings: Secondary | ICD-10-CM | POA: Diagnosis not present

## 2021-09-27 DIAGNOSIS — I152 Hypertension secondary to endocrine disorders: Secondary | ICD-10-CM | POA: Diagnosis not present

## 2021-09-27 DIAGNOSIS — Z6831 Body mass index (BMI) 31.0-31.9, adult: Secondary | ICD-10-CM

## 2021-09-27 LAB — MICROALBUMIN, URINE WAIVED
Creatinine, Urine Waived: 200 mg/dL (ref 10–300)
Microalb, Ur Waived: 30 mg/L — ABNORMAL HIGH (ref 0–19)
Microalb/Creat Ratio: <30 mg/g (ref <30)

## 2021-09-27 LAB — BAYER DCA HB A1C WAIVED: HB A1C (BAYER DCA - WAIVED): 6.7 % — ABNORMAL HIGH (ref 4.8–5.6)

## 2021-09-27 NOTE — Progress Notes (Signed)
**Note De-Identified [place] Obfuscation** BP 139/77    Pulse 78    Temp 98.5 F (36.9 C) (Oral)    Ht 5' 0.5" (1.537 m)    Wt 154 lb 6.4 oz (70 kg)    LMP  (LMP Unknown)    SpO2 97%    BMI 29.66 kg/m    Subjective:    Patient ID: Suzanne Jennings, female    DOB: April 09, 1951, 71 y.o.   MRN: [id number]  HPI: Suzanne Jennings is a 71 y.o. female presenting on 09/27/2021 for comprehensive medical examination. States she does not have any current medical complaints today.  NOTE WRITTEN BY [employer] DNP STUDENT.  ASSESSMENT AND PLAN OF CARE REVIEWED WITH STUDENT, AGREE WITH ABOVE FINDINGS AND PLAN.   DIABETES Currently diet controlled.  Last A1c in  August 7.5%. Today A1c is 6.7%. Has never taken medication for her diabetes.  She has been focused on diet and lost 13 pounds at last visit.  Continues to walk every night. Hypoglycemic episodes:no Polydipsia/polyuria: no Visual disturbance: no Chest pain: no Paresthesias: no Glucose Monitoring: yes             Accucheck frequency: every other day             Fasting glucose: 112- 118 range              Post prandial:             Evening:             Before meals: Taking Insulin?: no             Long acting insulin:             Short acting insulin: Blood Pressure Monitoring: daily Retinal Examination: Up to Date  Foot Exam: Up to Date Pneumovax: refused Influenza: refused Aspirin: yes    HYPERTENSION / HYPERLIPIDEMIA Continues on Atorvastatin, Losartan, + ASA.   Has history of CT scan in 2019 noting aortic atherosclerosis -- has history on this CT of a 5 mm nodule with recommendation to repeat in 2 years -- this was done on 03/14/21 and there was resolution of previous area noted. Satisfied with current treatment? yes Duration of hypertension: chronic BP monitoring frequency: daily BP range: 134/70 at home BP medication side effects: no Duration of hyperlipidemia: chronic Cholesterol medication side effects: no Cholesterol supplements: none Medication compliance: good  compliance Aspirin: yes Recent stressors: no Recurrent headaches: no Visual changes: no Palpitations: no Dyspnea: no Chest pain: no Lower extremity edema: no Dizzy/lightheaded: no    ENDOMETRIAL CANCER: History of endometrial cancer (grade 1 endometrial cancer with focal polyp) in June 2019, a complete hysterectomy was performed. No chemo or radiation.  Was followed by GYN, last visit 03/26/19 -- missed appointment scheduled with Dr. Fransisca Connors for 03/31/20. Reports overall doing well and does not want any further internal exams.  Denies any symptoms.   Depression Screen done today and results listed below:  Depression screen [facility] 2/9 09/27/2021 08/09/2021 09/01/2020 02/09/2020 06/13/2018  Decreased Interest 0 0 0 0 0  Down, Depressed, Hopeless 0 0 0 0 0  PHQ - 2 Score 0 0 0 0 0  Altered sleeping 0 - - - 0  Tired, decreased energy 0 - - - 0  Change in appetite 0 - - - 0  Feeling bad or failure about yourself  0 - - - 0  Trouble concentrating 0 - - - 0  Moving slowly or fidgety/restless 0 - - - 0 **Note De-Identified [place] Obfuscation** Suicidal thoughts 0 - - - 0  PHQ-9 Score 0 - - - 0    The patient does not have a history of falls. I did not complete a risk assessment for falls. A plan of care for falls not needed   Past Medical History:  Past Medical History:  Diagnosis Date   Diabetes mellitus without complication ([place])    Endometrial cancer ([place])    History of kidney stones    Hyperlipidemia    Hypertension    Kidney stone    Knee pain     Surgical History:  Past Surgical History:  Procedure Laterality Date   APPENDECTOMY     COLONOSCOPY WITH PROPOFOL N/A 07/03/2016   Procedure: COLONOSCOPY WITH PROPOFOL;  Surgeon: Lucilla Lame, MD;  Location: [place];  Service: Endoscopy;  Laterality: N/A;   LAPAROSCOPIC HYSTERECTOMY N/A 02/20/2018   Procedure: HYSTERECTOMY TOTAL LAPAROSCOPIC, WITH BILATERAL SALPINGOOPHERECTOMY;  Surgeon: Mellody Drown, MD;  Location: [facility];  Service: Gynecology;   Laterality: N/A;   POLYPECTOMY  07/03/2016   Procedure: POLYPECTOMY;  Surgeon: Lucilla Lame, MD;  Location: [facility];  Service: Endoscopy;;   SENTINEL NODE BIOPSY N/A 02/20/2018   Procedure: SENTINEL NODE BIOPSY AND MAPPING;  Surgeon: Mellody Drown, MD;  Location: [facility];  Service: Gynecology;  Laterality: N/A;   TUBAL LIGATION      Medications:  Current Outpatient Medications on File Prior to Visit  Medication Sig   aspirin EC 81 MG tablet Take 1 tablet (81 mg total) by mouth daily.   atorvastatin (LIPITOR) 80 MG tablet TAKE 1 TABLET BY MOUTH  DAILY   Blood Glucose Monitoring Suppl ([place]) w/Device KIT Use to check blood sugar 3-4 times a day and document for provider visits.  Goal less then 130 in morning fasting and less then 180 two hours after a meal.   cholecalciferol (VITAMIN D3) 25 MCG (1000 UNIT) tablet Take 1,000 Units by mouth daily.   losartan (COZAAR) 25 MG tablet Take 25 mg by mouth daily. 1 time daily   ONETOUCH VERIO test strip Use to check sugar 2-3 times daily.   OVER THE COUNTER MEDICATION Take 1 capsule by mouth daily. Zendocrine otc supplement   OVER THE COUNTER MEDICATION 2 tablets daily. Red Krill 3000 mg   Probiotic Product (PROBIOTIC PO) Take 1 capsule by mouth daily.    UNABLE TO FIND Take 4 capsules by mouth daily. Med Name: Nutrafol   olmesartan (BENICAR) 20 MG tablet TAKE 1 TABLET BY MOUTH  DAILY (Patient not taking: Reported on 08/09/2021)   No current facility-administered medications on file prior to visit.    Allergies:  Allergies  Allergen Reactions   Aspirin Other (See Comments)    "blood thinner, makes nose bleed - but pt can take 81 mg dose without problems   Other Other (See Comments)    Yogurt causes yeast infections Nuts except peanuts and almonds cause anaphylaxis    Penicillins Swelling and Rash    Has patient had a PCN reaction causing immediate rash, facial/tongue/throat swelling, SOB or lightheadedness  with hypotension: Yes Has patient had a PCN reaction causing severe rash involving mucus membranes or skin necrosis: No Has patient had a PCN reaction that required hospitalization: No Has patient had a PCN reaction occurring within the last 10 years: No If all of the above answers are "NO", then may proceed with Cephalosporin use.    Sulfa Antibiotics Swelling and Rash    Social History:  Social **Note De-Identified [place] Obfuscation** History   Socioeconomic History   Marital status: Married    Spouse name: Not on file   Number of children: Not on file   Years of education: Not on file   Highest education level: Not on file  Occupational History   Occupation: retired  Tobacco Use   Smoking status: Never   Smokeless tobacco: Never  Vaping Use   Vaping Use: Never used  Substance and Sexual Activity   Alcohol use: No    Alcohol/week: 0.0 standard drinks   Drug use: No   Sexual activity: Yes    Birth control/protection: Post-menopausal  Other Topics Concern   Not on file  Social History Narrative   Not on file   Social Determinants of Health   Financial Resource Strain: Low Risk    Difficulty of Paying Living Expenses: Not hard at all  Food Insecurity: No Food Insecurity   Worried About Charity fundraiser in the Last Year: Never true   [place] in the Last Year: Never true  Transportation Needs: No Transportation Needs   Lack of Transportation (Medical): No   Lack of Transportation (Non-Medical): No  Physical Activity: Sufficiently Active   Days of Exercise per Week: 4 days   Minutes of Exercise per Session: 50 min  Stress: No Stress Concern Present   Feeling of Stress : Not at all  Social Connections: Socially Integrated   Frequency of Communication with Friends and Family: More than three times a week   Frequency of Social Gatherings with Friends and Family: More than three times a week   Attends Religious Services: More than 4 times per year   Active Member of [employer] or Organizations: Yes    Attends Music therapist: More than 4 times per year   Marital Status: Married  Human resources officer Violence: Not At Risk   Fear of Current or Ex-Partner: No   Emotionally Abused: No   Physically Abused: No   Sexually Abused: No   Social History   Tobacco Use  Smoking Status Never  Smokeless Tobacco Never   Social History   Substance and Sexual Activity  Alcohol Use No   Alcohol/week: 0.0 standard drinks    Family History:  Family History  Problem Relation Age of Onset   Emphysema Mother    Heart disease Father        massive MI   Hypertension Sister    Mental illness Brother    Hypertension Sister    Hypertension Sister    Diabetes Maternal Grandmother    Breast cancer Neg Hx     Past medical history, surgical history, medications, allergies, family history and social history reviewed with patient today and changes made to appropriate areas of the chart.   Review of Systems  Constitutional:  Negative for chills, fever and malaise/fatigue.  HENT:  Negative for ear pain, hearing loss and tinnitus.   Eyes:  Negative for blurred vision, double vision, photophobia and redness.  Respiratory:  Negative for cough, shortness of breath and wheezing.   Cardiovascular:  Negative for chest pain, palpitations, orthopnea and leg swelling.  Gastrointestinal:  Negative for diarrhea, heartburn, nausea and vomiting.  Genitourinary:  Negative for frequency, hematuria and urgency.  Musculoskeletal:  Negative for falls, joint pain and myalgias.  Skin:  Negative for itching and rash.  Neurological:  Negative for dizziness, tingling, sensory change, weakness and headaches.  Endo/Heme/Allergies:  Negative for polydipsia.  Psychiatric/Behavioral:  Negative for depression and suicidal ideas. The **Note De-Identified [place] Obfuscation** patient is not nervous/anxious.    All other ROS negative except what is listed above and in the HPI.      Objective:    BP 139/77    Pulse 78    Temp 98.5 F (36.9 C) (Oral)    Ht 5'  0.5" (1.537 m)    Wt 154 lb 6.4 oz (70 kg)    LMP  (LMP Unknown)    SpO2 97%    BMI 29.66 kg/m   Wt Readings from Last 3 Encounters:  09/27/21 154 lb 6.4 oz (70 kg)  03/01/21 166 lb (75.3 kg)  09/01/20 164 lb 12.8 oz (74.8 kg)    Physical Exam Constitutional:      General: She is not in acute distress.    Appearance: Normal appearance. She is well-groomed. She is not ill-appearing.  HENT:     Head: Normocephalic.     Jaw: No tenderness.     Salivary Glands: Right salivary gland is not diffusely enlarged or tender. Left salivary gland is not diffusely enlarged or tender.     Right Ear: Tympanic membrane normal.     Left Ear: Tympanic membrane normal.     Nose: Nose normal.     Mouth/Throat:     Mouth: Mucous membranes are moist.  Eyes:     General: Lids are normal. No scleral icterus.       Right eye: No discharge.        Left eye: No discharge.     Conjunctiva/sclera: Conjunctivae normal.  Neck:     Thyroid: No thyroid mass, thyromegaly or thyroid tenderness.     Vascular: No carotid bruit.  Cardiovascular:     Rate and Rhythm: Normal rate and regular rhythm.     Pulses:          Dorsalis pedis pulses are 2+ on the right side and 2+ on the left side.       Posterior tibial pulses are 1+ on the right side and 1+ on the left side.     Heart sounds: Normal heart sounds. No murmur heard. Pulmonary:     Effort: Pulmonary effort is normal. No respiratory distress.     Breath sounds: Normal breath sounds. No wheezing or rhonchi.  Abdominal:     General: Abdomen is protuberant. Bowel sounds are normal.     Palpations: Abdomen is soft.     Tenderness: There is no abdominal tenderness. There is no right CVA tenderness or left CVA tenderness.  Musculoskeletal:        General: No swelling, tenderness or signs of injury.     Cervical back: No edema or tenderness. No muscular tenderness.     Right lower leg: No edema.     Left lower leg: No edema.     Right foot: Normal range of  motion.     Left foot: Normal range of motion.  Feet:     Right foot:     Protective Sensation: 8 sites tested.  8 sites sensed.     Skin integrity: No skin breakdown or warmth.     Toenail Condition: Right toenails are normal.     Left foot:     Protective Sensation: 8 sites tested.  8 sites sensed.     Skin integrity: No skin breakdown or warmth.     Toenail Condition: Left toenails are normal.  Lymphadenopathy:     Cervical: No cervical adenopathy.     Upper Body:     Right **Note De-Identified [place] Obfuscation** upper body: No supraclavicular adenopathy.     Left upper body: No supraclavicular adenopathy.  Skin:    General: Skin is warm and dry.     Findings: No erythema or rash.  Neurological:     Mental Status: She is alert and oriented to person, place, and time.     Deep Tendon Reflexes: Reflexes are normal and symmetric.  Psychiatric:        Attention and Perception: Attention normal.        Mood and Affect: Mood and affect normal. Mood is not anxious or depressed.        Speech: Speech normal.        Behavior: Behavior normal. Behavior is cooperative.        Thought Content: Thought content normal.   Diabetic Foot Exam - Simple   Simple Foot Form Visual Inspection No deformities, no ulcerations, no other skin breakdown bilaterally: Yes Sensation Testing Intact to touch and monofilament testing bilaterally: Yes Pulse Check Posterior Tibialis and Dorsalis pulse intact bilaterally: Yes Comments     Results for orders placed or performed in visit on 09/27/21  Bayer DCA Hb A1c Waived  Result Value Ref Range   HB A1C (BAYER DCA - WAIVED) 6.7 (H) 4.8 - 5.6 %  Microalbumin, Urine Waived  Result Value Ref Range   Microalb, Ur Waived 30 (H) 0 - 19 mg/L   Creatinine, Urine Waived 200 10 - 300 mg/dL   Microalb/Creat Ratio <30 <30 mg/g      Assessment & Plan:   Problem List Items Addressed This Visit       Cardiovascular and Mediastinum   Aortic atherosclerosis ([place])    Noted on CT 06/03/18.  Continue  daily statin and ASA for prevention.      Relevant Orders   Comprehensive metabolic panel   Lipid Panel w/o Chol/HDL Ratio   Hypertension associated with diabetes ([place])    Chronic, ongoing with initial BP elevated (often elevated on initial in office -- white coat), BP in office today 139/77 slightly higher than home reading per patient. PCP had discussion with patient about goal BP <130/80.  Continue Losartan at this time and adjust dose as needed, is tolerating well without ADR -- offers kidney protection with her diabetes.  Recommend she continue to monitor BP at home daily and document for provider + focus on DASH diet. CMP, CBC, urine ALB, TSH.  Return to office in 6 months.      Relevant Orders   Bayer DCA Hb A1c Waived (Completed)   Microalbumin, Urine Waived (Completed)   CBC with Differential/Platelet   Comprehensive metabolic panel   TSH     Endocrine   Hyperlipidemia associated with type 2 diabetes mellitus ([facility])    Chronic, ongoing.  Continue current medication regimen and adjust as needed.  Lipid panel ordered today.      Relevant Orders   Bayer DCA Hb A1c Waived (Completed)   Comprehensive metabolic panel   Lipid Panel w/o Chol/HDL Ratio   Type 2 diabetes mellitus with morbid obesity ([facility]) - Primary    Chronic, ongoing with A1C today is 6.7% down from 7.8% on last visit. Will continue diet focus at this time as she would prefer not to start medication. Continue Losartan for kidney protection due to urine ALB 30 today, some improvement from previous of 80. At home blood sugar range 112- 118. Continue to monitor BS at home daily and document + focus on diet.  Return in 6 months. **Note De-Identified [place] Obfuscation** Relevant Orders   Bayer DCA Hb A1c Waived (Completed)   Microalbumin, Urine Waived (Completed)   Comprehensive metabolic panel     Other   History of endometrial cancer    Stable, continue collaboration with GYN -- at this time she does not want any further internal exams performed and  reports no symptoms present -- if symptoms present she agrees will notify GYN or PCP.      Obesity    BMI 31.66 last visit, today BMI is 29.66.  Has continue to lose weight. Patient  walks 3x daily totaling around one mile/day. Patient continues to adhere to diet and exercise recommendations. Continue with current regimen.       Other Visit Diagnoses     Vitamin D deficiency       History of low, check on labs today and start supplement as needed.   Relevant Orders   VITAMIN D 25 Hydroxy (Vit-D Deficiency, Fractures)   Encounter for annual physical exam       Annual physical with labs and health maintenance reviewed -- refuses vaccines.        Follow up plan: Return in about 6 months (around 03/27/2022) for T2DM, HTN/HLD, ENDometrial CA.   LABORATORY TESTING:  - Pap smear: not applicable  IMMUNIZATIONS:   - Tdap: Tetanus vaccination status reviewed:  - Influenza: Refused - Pneumovax: Refused - Prevnar: Refused - COVID: Up to date - HPV: Not applicable - Shingrix vaccine: Refused  SCREENING: -Mammogram: Up to date  - Colonoscopy: Ordered today  - Bone Density: Up to date  -Hearing Test:  not applicable   -Spirometry: Not applicable   PATIENT COUNSELING:    Diet: Encouraged to adjust caloric intake to maintain  or achieve ideal body weight, to reduce intake of dietary saturated fat and total fat, to limit sodium intake by avoiding high sodium foods and not adding table salt, and to maintain adequate dietary potassium and calcium preferably from fresh fruits, vegetables, and low-fat dairy products.    stressed the importance of regular exercise  Injury prevention: Discussed safety belts, safety helmets, smoke detector, smoking near bedding or upholstery.   Dental health: Discussed importance of regular tooth brushing, flossing, and dental visits.    NEXT PREVENTATIVE PHYSICAL DUE IN 1 YEAR. Return in about 6 months (around 03/27/2022) for T2DM, HTN/HLD, ENDometrial  CA.

## 2021-09-27 NOTE — Patient Instructions (Signed)
**Note De-identified Laubscher Obfuscation** Diabetes Mellitus and Nutrition, Adult ?When you have diabetes, or diabetes mellitus, it is very important to have healthy eating habits because your blood sugar (glucose) levels are greatly affected by what you eat and drink. Eating healthy foods in the right amounts, at about the same times every day, can help you: ?Manage your blood glucose. ?Lower your risk of heart disease. ?Improve your blood pressure. ?Reach or maintain a healthy weight. ?What can affect my meal plan? ?Every person with diabetes is different, and each person has different needs for a meal plan. Your health care provider may recommend that you work with a dietitian to make a meal plan that is best for you. Your meal plan may vary depending on factors such as: ?The calories you need. ?The medicines you take. ?Your weight. ?Your blood glucose, blood pressure, and cholesterol levels. ?Your activity level. ?Other health conditions you have, such as heart or kidney disease. ?How do carbohydrates affect me? ?Carbohydrates, also called carbs, affect your blood glucose level more than any other type of food. Eating carbs raises the amount of glucose in your blood. ?It is important to know how many carbs you can safely have in each meal. This is different for every person. Your dietitian can help you calculate how many carbs you should have at each meal and for each snack. ?How does alcohol affect me? ?Alcohol can cause a decrease in blood glucose (hypoglycemia), especially if you use insulin or take certain diabetes medicines by mouth. Hypoglycemia can be a life-threatening condition. Symptoms of hypoglycemia, such as sleepiness, dizziness, and confusion, are similar to symptoms of having too much alcohol. ?Do not drink alcohol if: ?Your health care provider tells you not to drink. ?You are pregnant, may be pregnant, or are planning to become pregnant. ?If you drink alcohol: ?Limit how much you have to: ?0-1 drink a day for women. ?0-2 drinks a day  for men. ?Know how much alcohol is in your drink. In the [place] one drink equals one 12 oz bottle of beer (355 mL), one 5 oz glass of wine (148 mL), or one 1? oz glass of hard liquor (44 mL). ?Keep yourself hydrated with water, diet soda, or unsweetened iced tea. Keep in mind that regular soda, juice, and other mixers may contain a lot of sugar and must be counted as carbs. ?What are tips for following this plan? ?Reading food labels ?Start by checking the serving size on the Nutrition Facts label of packaged foods and drinks. The number of calories and the amount of carbs, fats, and other nutrients listed on the label are based on one serving of the item. Many items contain more than one serving per package. ?Check the total grams (g) of carbs in one serving. ?Check the number of grams of saturated fats and trans fats in one serving. Choose foods that have a low amount or none of these fats. ?Check the number of milligrams (mg) of salt (sodium) in one serving. Most people should limit total sodium intake to less than 2,300 mg per day. ?Always check the nutrition information of foods labeled as "low-fat" or "nonfat." These foods may be higher in added sugar or refined carbs and should be avoided. ?Talk to your dietitian to identify your daily goals for nutrients listed on the label. ?Shopping ?Avoid buying canned, pre-made, or processed foods. These foods tend to be high in fat, sodium, and added sugar. ?Shop around the outside edge of the grocery store. This is where you will  **Note De-identified Warnke Obfuscation** most often find fresh fruits and vegetables, bulk grains, fresh meats, and fresh dairy products. ?Cooking ?Use low-heat cooking methods, such as baking, instead of high-heat cooking methods, such as deep frying. ?Cook using healthy oils, such as olive, canola, or sunflower oil. ?Avoid cooking with butter, cream, or high-fat meats. ?Meal planning ?Eat meals and snacks regularly, preferably at the same times every day. Avoid going long periods of  time without eating. ?Eat foods that are high in fiber, such as fresh fruits, vegetables, beans, and whole grains. ?Eat 4-6 oz (112-168 g) of lean protein each day, such as lean meat, chicken, fish, eggs, or tofu. One ounce (oz) (28 g) of lean protein is equal to: ?1 oz (28 g) of meat, chicken, or fish. ?1 egg. ?? cup (62 g) of tofu. ?Eat some foods each day that contain healthy fats, such as avocado, nuts, seeds, and fish. ?What foods should I eat? ?Fruits ?Berries. Apples. Oranges. Peaches. Apricots. Plums. Grapes. Mangoes. Papayas. Pomegranates. Kiwi. Cherries. ?Vegetables ?Leafy greens, including lettuce, spinach, kale, chard, collard greens, mustard greens, and cabbage. Beets. Cauliflower. Broccoli. Carrots. Green beans. Tomatoes. Peppers. Onions. Cucumbers. Brussels sprouts. ?Grains ?Whole grains, such as whole-wheat or whole-grain bread, crackers, tortillas, cereal, and pasta. Unsweetened oatmeal. Quinoa. Brown or wild rice. ?Meats and other proteins ?Seafood. Poultry without skin. Lean cuts of poultry and beef. Tofu. Nuts. Seeds. ?Dairy ?Low-fat or fat-free dairy products such as milk, yogurt, and cheese. ?The items listed above may not be a complete list of foods and beverages you can eat and drink. Contact a dietitian for more information. ?What foods should I avoid? ?Fruits ?Fruits canned with syrup. ?Vegetables ?Canned vegetables. Frozen vegetables with butter or cream sauce. ?Grains ?Refined white flour and flour products such as bread, pasta, snack foods, and cereals. Avoid all processed foods. ?Meats and other proteins ?Fatty cuts of meat. Poultry with skin. Breaded or fried meats. Processed meat. Avoid saturated fats. ?Dairy ?Full-fat yogurt, cheese, or milk. ?Beverages ?Sweetened drinks, such as soda or iced tea. ?The items listed above may not be a complete list of foods and beverages you should avoid. Contact a dietitian for more information. ?Questions to ask a health care provider ?Do I need to  meet with a certified diabetes care and education specialist? ?Do I need to meet with a dietitian? ?What number can I call if I have questions? ?When are the best times to check my blood glucose? ?Where to find more information: ?American Diabetes Association: diabetes.org ?Academy of Nutrition and Dietetics: eatright.org ?National Institute of Diabetes and Digestive and Kidney Diseases: niddk.nih.gov ?Association of Diabetes Care & Education Specialists: diabeteseducator.org ?Summary ?It is important to have healthy eating habits because your blood sugar (glucose) levels are greatly affected by what you eat and drink. It is important to use alcohol carefully. ?A healthy meal plan will help you manage your blood glucose and lower your risk of heart disease. ?Your health care provider may recommend that you work with a dietitian to make a meal plan that is best for you. ?This information is not intended to replace advice given to you by your health care provider. Make sure you discuss any questions you have with your health care provider. ?Document Revised: 03/03/2020 Document Reviewed: 03/03/2020 ?Elsevier Patient Education ? 2022 Elsevier Inc. ? ?

## 2021-09-27 NOTE — Assessment & Plan Note (Addendum)
**Note De-Identified Fazzini Obfuscation** Chronic, ongoing with initial BP elevated (often elevated on initial in office -- white coat), BP in office today 139/77 slightly higher than home reading per patient. PCP had discussion with patient about goal BP <130/80.  Continue Losartan at this time and adjust dose as needed, is tolerating well without ADR -- offers kidney protection with her diabetes.  Recommend she continue to monitor BP at home daily and document for provider + focus on DASH diet. CMP, CBC, urine ALB, TSH.  Return to office in 6 months.

## 2021-09-27 NOTE — Assessment & Plan Note (Signed)
**Note De-Identified Chamberlain Obfuscation** Noted on CT 06/03/18.  Continue daily statin and ASA for prevention.

## 2021-09-27 NOTE — Assessment & Plan Note (Signed)
**Note De-identified Dugo Obfuscation** Stable, continue collaboration with GYN -- at this time she does not want any further internal exams performed and reports no symptoms present -- if symptoms present she agrees will notify GYN or PCP. 

## 2021-09-27 NOTE — Assessment & Plan Note (Signed)
**Note De-Identified Safley Obfuscation** Chronic, ongoing.  Continue current medication regimen and adjust as needed.  Lipid panel ordered today.

## 2021-09-27 NOTE — Assessment & Plan Note (Signed)
**Note De-Identified Decatur Obfuscation** BMI 31.66 last visit, today BMI is 29.66.  Has continue to lose weight. Patient  walks 3x daily totaling around one mile/day. Patient continues to adhere to diet and exercise recommendations. Continue with current regimen.

## 2021-09-27 NOTE — Assessment & Plan Note (Addendum)
**Note De-Identified Reznick Obfuscation** Chronic, ongoing with A1C today is 6.7% down from 7.8% on last visit. Will continue diet focus at this time as she would prefer not to start medication. Continue Losartan for kidney protection due to urine ALB 30 today, some improvement from previous of 80. At home blood sugar range 112- 118. Continue to monitor BS at home daily and document + focus on diet.  Return in 6 months.

## 2021-09-28 LAB — CBC WITH DIFFERENTIAL/PLATELET
Basophils Absolute: 0 10*3/uL (ref 0.0–0.2)
Basos: 1 %
EOS (ABSOLUTE): 0 10*3/uL (ref 0.0–0.4)
Eos: 0 %
Hematocrit: 43.8 % (ref 34.0–46.6)
Hemoglobin: 14.8 g/dL (ref 11.1–15.9)
Immature Grans (Abs): 0 10*3/uL (ref 0.0–0.1)
Immature Granulocytes: 0 %
Lymphocytes Absolute: 2.3 10*3/uL (ref 0.7–3.1)
Lymphs: 33 %
MCH: 32.5 pg (ref 26.6–33.0)
MCHC: 33.8 g/dL (ref 31.5–35.7)
MCV: 96 fL (ref 79–97)
Monocytes Absolute: 0.5 10*3/uL (ref 0.1–0.9)
Monocytes: 7 %
Neutrophils Absolute: 3.9 10*3/uL (ref 1.4–7.0)
Neutrophils: 59 %
Platelets: 256 10*3/uL (ref 150–450)
RBC: 4.56 x10E6/uL (ref 3.77–5.28)
RDW: 12.1 % (ref 11.7–15.4)
WBC: 6.8 10*3/uL (ref 3.4–10.8)

## 2021-09-28 LAB — COMPREHENSIVE METABOLIC PANEL
ALT: 34 IU/L — ABNORMAL HIGH (ref 0–32)
AST: 25 IU/L (ref 0–40)
Albumin/Globulin Ratio: 1.6 (ref 1.2–2.2)
Albumin: 4.6 g/dL (ref 3.7–4.7)
Alkaline Phosphatase: 100 IU/L (ref 44–121)
BUN/Creatinine Ratio: 16 (ref 12–28)
BUN: 12 mg/dL (ref 8–27)
Bilirubin Total: 1.1 mg/dL (ref 0.0–1.2)
CO2: 23 mmol/L (ref 20–29)
Calcium: 10.2 mg/dL (ref 8.7–10.3)
Chloride: 99 mmol/L (ref 96–106)
Creatinine, Ser: 0.73 mg/dL (ref 0.57–1.00)
Globulin, Total: 2.8 g/dL (ref 1.5–4.5)
Glucose: 128 mg/dL — ABNORMAL HIGH (ref 70–99)
Potassium: 4.1 mmol/L (ref 3.5–5.2)
Sodium: 141 mmol/L (ref 134–144)
Total Protein: 7.4 g/dL (ref 6.0–8.5)
eGFR: 87 mL/min/{1.73_m2} (ref >59)

## 2021-09-28 LAB — LIPID PANEL W/O CHOL/HDL RATIO
Cholesterol, Total: 164 mg/dL (ref 100–199)
HDL: 44 mg/dL (ref >39)
LDL Chol Calc (NIH): 91 mg/dL (ref 0–99)
Triglycerides: 170 mg/dL — ABNORMAL HIGH (ref 0–149)
VLDL Cholesterol Cal: 29 mg/dL (ref 5–40)

## 2021-09-28 LAB — VITAMIN D 25 HYDROXY (VIT D DEFICIENCY, FRACTURES): Vit D, 25-Hydroxy: 54 ng/mL (ref 30.0–100.0)

## 2021-09-28 LAB — TSH: TSH: 2.09 u[IU]/mL (ref 0.450–4.500)

## 2021-09-28 NOTE — Progress Notes (Signed)
**Note De-Identified Douthitt Obfuscation** Good afternoon, please let Suzanne Jennings know her labs have returned with exception of thyroid lab which is still pending, if this returns abnormal I will let her know.  Kidney function, creatinine and eGFR, remains normal.  Liver function shows mild elevation in ALT, but this is stable from previous visits and we will continue to monitor.  Cholesterol labs continue to show LDL under 100, continue your diet focus.  Vitamin D normal.  Any questions? Keep being amazing!!  Thank you for allowing me to participate in your care.  I appreciate you. Kindest regards, Lashun Ramseyer

## 2021-10-26 ENCOUNTER — Other Ambulatory Visit: Payer: Self-pay | Admitting: Nurse Practitioner

## 2021-10-27 NOTE — Telephone Encounter (Signed)
**Note De-Identified Burgin Obfuscation** Requested Prescriptions  ?Pending Prescriptions Disp Refills  ?? olmesartan (BENICAR) 20 MG tablet [Pharmacy Med Name: Olmesartan Medoxomil 20 MG Oral Tablet] 90 tablet 3  ?  Sig: TAKE 1 TABLET BY MOUTH DAILY  ?  ? Cardiovascular:  Angiotensin Receptor Blockers Passed - 10/26/2021 10:42 PM  ?  ?  Passed - Cr in normal range and within 180 days  ?  Creatinine, Ser  ?Date Value Ref Range Status  ?09/27/2021 0.73 0.57 - 1.00 mg/dL Final  ?   ?  ?  Passed - K in normal range and within 180 days  ?  Potassium  ?Date Value Ref Range Status  ?09/27/2021 4.1 3.5 - 5.2 mmol/L Final  ?   ?  ?  Passed - Patient is not pregnant  ?  ?  Passed - Last BP in normal range  ?  BP Readings from Last 1 Encounters:  ?09/27/21 139/77  ?   ?  ?  Passed - Valid encounter within last 6 months  ?  Recent Outpatient Visits   ?      ? 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 [place], Meyers T, NP  ? 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 [place], Orick T, NP  ? 1 year ago Hypertension associated with diabetes ([place])  ? [place], Harrah T, NP  ? 1 year ago Type 2 diabetes mellitus with morbid obesity ([place])  ? [place], [place] T, NP  ? 1 year ago Hypertension associated with diabetes ([place])  ? [facility] [place], Henrine Screws T, NP  ?  ?  ?Future Appointments   ?        ? In 5 months Cannady, Barbaraann Faster, NP [facility]  ?  ? ?  ?  ?  ? ? ?

## 2021-12-18 ENCOUNTER — Other Ambulatory Visit: Payer: Self-pay | Admitting: Nurse Practitioner

## 2021-12-20 NOTE — Telephone Encounter (Signed)
**Note De-Identified Rohman Obfuscation** Requested Prescriptions  ?Pending Prescriptions Disp Refills  ?? atorvastatin (LIPITOR) 80 MG tablet [Pharmacy Med Name: Atorvastatin Calcium 80 MG Oral Tablet] 90 tablet 0  ?  Sig: TAKE 1 TABLET BY MOUTH ONCE  DAILY  ?  ? Cardiovascular:  Antilipid - Statins Failed - 12/18/2021 11:20 PM  ?  ?  Failed - Lipid Panel in normal range within the last 12 months  ?  Cholesterol, Total  ?Date Value Ref Range Status  ?09/27/2021 164 100 - 199 mg/dL Final  ? ?Cholesterol Piccolo, [place]  ?Date Value Ref Range Status  ?06/29/2017 169 <200 mg/dL Final  ?  Comment:  ?                          Desirable                <200 ?                        Borderline High      200- 239 ?                        High                     >239 ?  ? ?LDL Chol Calc (NIH)  ?Date Value Ref Range Status  ?09/27/2021 91 0 - 99 mg/dL Final  ? ?HDL  ?Date Value Ref Range Status  ?09/27/2021 44 >39 mg/dL Final  ? ?Triglycerides  ?Date Value Ref Range Status  ?09/27/2021 170 (H) 0 - 149 mg/dL Final  ? ?Triglycerides Piccolo,Waived  ?Date Value Ref Range Status  ?06/29/2017 179 (H) <150 mg/dL Final  ?  Comment:  ?                          Normal                   <150 ?                        Borderline High     150 - 199 ?                        High                200 - 499 ?                        Very High                >499 ?  ? ?  ?  ?  Passed - Patient is not pregnant  ?  ?  Passed - Valid encounter within last 12 months  ?  Recent Outpatient Visits   ?      ? 2 months ago Type 2 diabetes mellitus with morbid obesity ([place])  ? [place], Cawood T, NP  ? 9 months ago Type 2 diabetes mellitus with morbid obesity ([place])  ? [place], Waianae T, NP  ? 1 year ago Hypertension associated with diabetes ([place])  ? [place], [place] T, NP  ? 1 year ago Type 2 diabetes mellitus with morbid obesity ([place])  ? [facility] [place], Henrine Screws T, NP  ? **Note De-Identified Haugen Obfuscation** 1 year ago Hypertension associated  with diabetes ([place])  ? [facility] [place], Henrine Screws T, NP  ?  ?  ?Future Appointments   ?        ? In 3 months Cannady, Barbaraann Faster, NP [facility]  ?  ? ?  ?  ?  ? ? ?

## 2022-03-03 ENCOUNTER — Ambulatory Visit: Payer: Medicare Other

## 2022-03-14 ENCOUNTER — Other Ambulatory Visit: Payer: Self-pay | Admitting: Nurse Practitioner

## 2022-03-15 NOTE — Telephone Encounter (Signed)
**Note De-Identified Ilyas Obfuscation** Requested Prescriptions  Pending Prescriptions Disp Refills  . atorvastatin (LIPITOR) 80 MG tablet [Pharmacy Med Name: Atorvastatin Calcium 80 MG Oral Tablet] 90 tablet 1    Sig: TAKE 1 TABLET BY MOUTH ONCE  DAILY     Cardiovascular:  Antilipid - Statins Failed - 03/14/2022  6:08 AM      Failed - Lipid Panel in normal range within the last 12 months    Cholesterol, Total  Date Value Ref Range Status  09/27/2021 164 100 - 199 mg/dL Final   Cholesterol Piccolo, Waived  Date Value Ref Range Status  06/29/2017 169 <200 mg/dL Final    Comment:                            Desirable                <200                         Borderline High      200- 239                         High                     >239    LDL Chol Calc (NIH)  Date Value Ref Range Status  09/27/2021 91 0 - 99 mg/dL Final   HDL  Date Value Ref Range Status  09/27/2021 44 >39 mg/dL Final   Triglycerides  Date Value Ref Range Status  09/27/2021 170 (H) 0 - 149 mg/dL Final   Triglycerides Piccolo,Waived  Date Value Ref Range Status  06/29/2017 179 (H) <150 mg/dL Final    Comment:                            Normal                   <150                         Borderline High     150 - 199                         High                200 - 499                         Very High                >499          Passed - Patient is not pregnant      Passed - Valid encounter within last 12 months    Recent Outpatient Visits          5 months ago Type 2 diabetes mellitus with morbid obesity ([place])   [place], Jolene T, NP   1 year ago Type 2 diabetes mellitus with morbid obesity ([place])   [place], [place] T, NP   1 year ago Hypertension associated with diabetes ([place])   [place], Jolene T, NP   1 year ago Type 2 diabetes mellitus with morbid obesity ([place])   [place], Barbaraann Faster, NP **Note De-Identified Hodzic Obfuscation** 1 year ago Hypertension associated with  diabetes ([place])   [place], Barbaraann Faster, NP      Future Appointments            In 1 week Cannady, Barbaraann Faster, NP [facility]

## 2022-03-27 ENCOUNTER — Ambulatory Visit: Payer: Medicare Other | Admitting: Nurse Practitioner

## 2022-04-01 NOTE — Patient Instructions (Signed)
**Note De-identified Savary Obfuscation** Diabetes Mellitus and Exercise ?Exercising regularly is important for overall health, especially for people who have diabetes mellitus. Exercising is not only about losing weight. It has many other health benefits, such as increasing muscle strength and bone density and reducing body fat and stress. This leads to improved fitness, flexibility, and endurance, all of which result in better overall health. ?What are the benefits of exercise if I have diabetes? ?Exercise has many benefits for people with diabetes. They include: ?Helping to lower and control blood sugar (glucose). ?Helping the body to respond better to the hormone insulin by improving insulin sensitivity. ?Reducing how much insulin the body needs. ?Lowering the risk for heart disease by: ?Lowering "bad" cholesterol and triglyceride levels. ?Increasing "good" cholesterol levels. ?Lowering blood pressure. ?Lowering blood glucose levels. ?What is my activity plan? ?Your health care provider or certified diabetes educator can help you make a plan for the type and frequency of exercise that works for you. This is called your activity plan. Be sure to: ?Get at least 150 minutes of medium-intensity or high-intensity exercise each week. Exercises may include brisk walking, biking, or water aerobics. ?Do stretching and strengthening exercises, such as yoga or weight lifting, at least 2 times a week. ?Spread out your activity over at least 3 days of the week. ?Get some form of physical activity each day. ?Do not go more than 2 days in a row without some kind of physical activity. ?Avoid being inactive for more than 90 minutes at a time. Take frequent breaks to walk or stretch. ?Choose exercises or activities that you enjoy. Set realistic goals. ?Start slowly and gradually increase your exercise intensity over time. ?How do I manage my diabetes during exercise? ? ?Monitor your blood glucose ?Check your blood glucose before and after exercising. If your blood  glucose is: ?240 mg/dL (13.3 mmol/L) or higher before you exercise, check your urine for ketones. These are chemicals created by the liver. If you have ketones in your urine, do not exercise until your blood glucose returns to normal. ?100 mg/dL (5.6 mmol/L) or lower, eat a snack containing 15-20 grams of carbohydrate. Check your blood glucose 15 minutes after the snack to make sure that your glucose level is above 100 mg/dL (5.6 mmol/L) before you start your exercise. ?Know the symptoms of low blood glucose (hypoglycemia) and how to treat it. Your risk for hypoglycemia increases during and after exercise. ?Follow these tips and your health care provider's instructions ?Keep a carbohydrate snack that is fast-acting for use before, during, and after exercise to help prevent or treat hypoglycemia. ?Avoid injecting insulin into areas of the body that are going to be exercised. For example, avoid injecting insulin into: ?Your arms, when you are about to play tennis. ?Your legs, when you are about to go jogging. ?Keep records of your exercise habits. Doing this can help you and your health care provider adjust your diabetes management plan as needed. Write down: ?Food that you eat before and after you exercise. ?Blood glucose levels before and after you exercise. ?The type and amount of exercise you have done. ?Work with your health care provider when you start a new exercise or activity. He or she may need to: ?Make sure that the activity is safe for you. ?Adjust your insulin, other medicines, and food that you eat. ?Drink plenty of water while you exercise. This prevents loss of water (dehydration) and problems caused by a lot of heat in the body (heat stroke). ?Where to find more  **Note De-identified Kraynak Obfuscation** information ?American Diabetes Association: www.diabetes.org ?Summary ?Exercising regularly is important for overall health, especially for people who have diabetes mellitus. ?Exercising has many health benefits. It increases muscle strength  and bone density and reduces body fat and stress. It also lowers and controls blood glucose. ?Your health care provider or certified diabetes educator can help you make an activity plan for the type and frequency of exercise that works for you. ?Work with your health care provider to make sure any new activity is safe for you. Also work with your health care provider to adjust your insulin, other medicines, and the food you eat. ?This information is not intended to replace advice given to you by your health care provider. Make sure you discuss any questions you have with your health care provider. ?Document Revised: 04/28/2019 Document Reviewed: 04/28/2019 ?Elsevier Patient Education ? 2023 Elsevier Inc. ? ?

## 2022-04-03 ENCOUNTER — Encounter: Payer: Self-pay | Admitting: Nurse Practitioner

## 2022-04-03 ENCOUNTER — Ambulatory Visit (INDEPENDENT_AMBULATORY_CARE_PROVIDER_SITE_OTHER): Payer: Medicare Other | Admitting: Nurse Practitioner

## 2022-04-03 DIAGNOSIS — Z8542 Personal history of malignant neoplasm of other parts of uterus: Secondary | ICD-10-CM | POA: Diagnosis not present

## 2022-04-03 DIAGNOSIS — E785 Hyperlipidemia, unspecified: Secondary | ICD-10-CM | POA: Diagnosis not present

## 2022-04-03 DIAGNOSIS — E1159 Type 2 diabetes mellitus with other circulatory complications: Secondary | ICD-10-CM | POA: Diagnosis not present

## 2022-04-03 DIAGNOSIS — I152 Hypertension secondary to endocrine disorders: Secondary | ICD-10-CM

## 2022-04-03 DIAGNOSIS — I700 Atherosclerosis of aorta: Secondary | ICD-10-CM

## 2022-04-03 DIAGNOSIS — Z6831 Body mass index (BMI) 31.0-31.9, adult: Secondary | ICD-10-CM

## 2022-04-03 DIAGNOSIS — E1169 Type 2 diabetes mellitus with other specified complication: Secondary | ICD-10-CM

## 2022-04-03 DIAGNOSIS — E6609 Other obesity due to excess calories: Secondary | ICD-10-CM

## 2022-04-03 DIAGNOSIS — N3941 Urge incontinence: Secondary | ICD-10-CM | POA: Insufficient documentation

## 2022-04-03 LAB — BAYER DCA HB A1C WAIVED: HB A1C (BAYER DCA - WAIVED): 9.3 % — ABNORMAL HIGH (ref 4.8–5.6)

## 2022-04-03 MED ORDER — METFORMIN HCL 500 MG PO TABS: 500.0000 mg | ORAL_TABLET | Freq: Two times a day (BID) | ORAL | 3 refills | Status: DC

## 2022-04-03 NOTE — Assessment & Plan Note (Signed)
**Note De-Identified Ellwanger Obfuscation** BMI 32.00, some gain present.  Recommended eating smaller high protein, low fat meals more frequently and exercising 30 mins a day 5 times a week with a goal of 10-15lb weight loss in the next 3 months. Patient voiced their understanding and motivation to adhere to these recommendations.

## 2022-04-03 NOTE — Assessment & Plan Note (Signed)
**Note De-Identified Paolella Obfuscation** Over past months, ?related to diabetes with elevation in A1c >9%.  At this time recommend she continue at home regimen and add on pelvic floor exercises, discussed these with her and benefit to pelvic health.  She will try this, prefers to avoid medications.

## 2022-04-03 NOTE — Assessment & Plan Note (Signed)
**Note De-identified Hildebrandt Obfuscation** Ongoing and stable.  Noted on CT 06/03/18.  Continue daily statin and ASA for prevention. 

## 2022-04-03 NOTE — Assessment & Plan Note (Signed)
**Note De-identified Vig Obfuscation** Chronic, ongoing.  Continue current medication regimen and adjust as needed. Lipid panel today. 

## 2022-04-03 NOTE — Assessment & Plan Note (Signed)
**Note De-identified Vangieson Obfuscation** Stable, continue collaboration with GYN -- at this time she does not want any further internal exams performed and reports no symptoms present -- if symptoms present she agrees will notify GYN or PCP. 

## 2022-04-03 NOTE — Assessment & Plan Note (Signed)
**Note De-Identified Wagman Obfuscation** Chronic, ongoing with A1C with upward trend to 9.3%, this is highest PCP has seen for her and suspect related to diet over summer.   - Discussed with her various options and highly recommend at this time we add on treatment, will start Metformin 500 MG BID to start and adjust as needed based on sugars at home and A1c.  She prefers to start out with this regimen vs GLP1 or SGLT2. - Continue Olmesartan for kidney protection due to urine ALB 30 February 2023, some improvement from previous of 80 two year ago.   - Continue to monitor BS at home daily and document + focus on diet.   - Refuses vaccinations. - Eye and foot exam up to date. - Return in 6 weeks.

## 2022-04-03 NOTE — Assessment & Plan Note (Signed)
**Note De-Identified Bargo Obfuscation** Chronic, ongoing with initial BP elevated, but repeat at goal and similar to her home BPs which are at goal.  Discussed with her goal BP <130/80.  Continue Olmesartan at this time and adjust dose as needed, is tolerating well without ADR -- offers kidney protection with her diabetes.  Recommend she continue to monitor BP at home daily and document for provider + focus on DASH diet.  LABS: CMP today.

## 2022-04-03 NOTE — Progress Notes (Signed)
**Note De-Identified [place] Obfuscation** BP 128/86 (BP Location: Left Arm, Patient Position: Sitting, Cuff Size: Normal)   Pulse 71   Temp 97.9 F (36.6 C) (Oral)   Ht 5' 0.5" (1.537 m)   Wt 166 lb 9.6 oz (75.6 kg)   LMP  (LMP Unknown)   SpO2 97%   BMI 32.00 kg/m    Subjective:    Patient ID: Suzanne Jennings, female    DOB: 08/07/1951, 71 y.o.   MRN: [id number]  HPI: Suzanne Jennings is a 71 y.o. female  Chief Complaint  Patient presents with   Diabetes   Hyperlipidemia   Hypertension   Endometrial CA   DIABETES Currently diet controlled.  Last A1c  6.7% February.  Has never taken medication for her diabetes.  She has been focused on diet and activity.  She is walking every morning and night.  Did go on vacation and ate some foods that were outside of her normal over summer. Hypoglycemic episodes:no Polydipsia/polyuria: no Visual disturbance: no Chest pain: no Paresthesias: no Glucose Monitoring: yes             Accucheck frequency: every other day             Fasting glucose: 110 range -- one day had a 184 level             Post prandial:             Evening:             Before meals: Taking Insulin?: no             Long acting insulin:             Short acting insulin: Blood Pressure Monitoring: daily Retinal Examination: Up to Date  Foot Exam: Up to Date Pneumovax: refused Influenza: refused Aspirin: yes    HYPERTENSION / HYPERLIPIDEMIA Continues on Atorvastatin, Losartan, + ASA.  CT scan in 2019 noted aortic atherosclerosis. Satisfied with current treatment? yes Duration of hypertension: chronic BP monitoring frequency: daily BP range: 127/69 recently BP medication side effects: no Duration of hyperlipidemia: chronic Cholesterol medication side effects: no Cholesterol supplements: none Medication compliance: good compliance Aspirin: yes Recent stressors: no Recurrent headaches: no Visual changes: no Palpitations: no Dyspnea: no Chest pain: no Lower extremity edema: no Dizzy/lightheaded: no     ENDOMETRIAL CANCER: History of endometrial cancer (grade 1 endometrial cancer with focal polyp) in June 2019, a complete hysterectomy was performed. No chemo or radiation.  Followed by GYN in past, last visit 03/26/19 -- missed appointment scheduled with Dr. Fransisca Connors for 03/31/20. Reports overall doing well and does not want any further internal exams.  Denies any symptoms.  Has recently had some issues with dribbling, but is taking Azo and this helps -- notices it more with drinking lot of fluid (none with sneezing or coughing).  Relevant past medical, surgical, family and social history reviewed and updated as indicated. Interim medical history since our last visit reviewed. Allergies and medications reviewed and updated.  Review of Systems  Constitutional:  Negative for activity change, appetite change, diaphoresis, fatigue and fever.  Respiratory:  Negative for cough, chest tightness and shortness of breath.   Cardiovascular:  Negative for chest pain, palpitations and leg swelling.  Gastrointestinal: Negative.   Endocrine: Negative for cold intolerance, heat intolerance, polydipsia, polyphagia and polyuria.  Genitourinary:  Positive for urgency (occasional). Negative for decreased urine volume, dysuria, flank pain, frequency, hematuria and pelvic pain.  Neurological: Negative.   Psychiatric/Behavioral: Negative. **Note De-Identified [place] Obfuscation** Per HPI unless specifically indicated above     Objective:    BP 128/86 (BP Location: Left Arm, Patient Position: Sitting, Cuff Size: Normal)   Pulse 71   Temp 97.9 F (36.6 C) (Oral)   Ht 5' 0.5" (1.537 m)   Wt 166 lb 9.6 oz (75.6 kg)   LMP  (LMP Unknown)   SpO2 97%   BMI 32.00 kg/m   Wt Readings from Last 3 Encounters:  04/03/22 166 lb 9.6 oz (75.6 kg)  09/27/21 154 lb 6.4 oz (70 kg)  03/01/21 166 lb (75.3 kg)    Physical Exam Vitals and nursing note reviewed.  Constitutional:      General: She is awake. She is not in acute distress.    Appearance: She  is well-developed and well-groomed. She is obese. She is not ill-appearing.  HENT:     Head: Normocephalic.     Right Ear: Hearing normal.     Left Ear: Hearing normal.  Eyes:     General: Lids are normal.        Right eye: No discharge.        Left eye: No discharge.     Conjunctiva/sclera: Conjunctivae normal.     Pupils: Pupils are equal, round, and reactive to light.  Neck:     Thyroid: No thyromegaly.     Vascular: No carotid bruit.  Cardiovascular:     Rate and Rhythm: Normal rate and regular rhythm.     Heart sounds: Normal heart sounds. No murmur heard.    No gallop.  Pulmonary:     Effort: Pulmonary effort is normal. No accessory muscle usage or respiratory distress.     Breath sounds: Normal breath sounds.  Abdominal:     General: Bowel sounds are normal. There is no distension.     Palpations: Abdomen is soft.     Tenderness: There is no abdominal tenderness. There is no right CVA tenderness or left CVA tenderness.  Musculoskeletal:     Cervical back: Normal range of motion and neck supple.     Right lower leg: No edema.     Left lower leg: No edema.  Skin:    General: Skin is warm and dry.  Neurological:     Mental Status: She is alert and oriented to person, place, and time.  Psychiatric:        Attention and Perception: Attention normal.        Mood and Affect: Mood normal.        Speech: Speech normal.        Behavior: Behavior normal. Behavior is cooperative.        Thought Content: Thought content normal.    Diabetic Foot Exam - Simple   Simple Foot Form Visual Inspection No deformities, no ulcerations, no other skin breakdown bilaterally: Yes Sensation Testing Intact to touch and monofilament testing bilaterally: Yes Pulse Check Posterior Tibialis and Dorsalis pulse intact bilaterally: Yes Comments     Results for orders placed or performed in visit on 04/03/22  Bayer DCA Hb A1c Waived  Result Value Ref Range   HB A1C (BAYER DCA - WAIVED) 9.3  (H) 4.8 - 5.6 %      Assessment & Plan:   Problem List Items Addressed This Visit       Cardiovascular and Mediastinum   Aortic atherosclerosis ([place])    Ongoing and stable.  Noted on CT 06/03/18.  Continue daily statin and ASA for prevention. **Note De-Identified [place] Obfuscation** Relevant Orders   Comprehensive metabolic panel   Lipid Panel w/o Chol/HDL Ratio   Hypertension associated with diabetes (HCC)    Chronic, ongoing with initial BP elevated, but repeat at goal and similar to her home BPs which are at goal.  Discussed with her goal BP <130/80.  Continue Olmesartan at this time and adjust dose as needed, is tolerating well without ADR -- offers kidney protection with her diabetes.  Recommend she continue to monitor BP at home daily and document for provider + focus on DASH diet.  LABS: CMP today.      Relevant Medications   metFORMIN (GLUCOPHAGE) 500 MG tablet   Other Relevant Orders   Bayer DCA Hb A1c Waived (Completed)   Comprehensive metabolic panel     Endocrine   Hyperlipidemia associated with type 2 diabetes mellitus (HCC)    Chronic, ongoing.  Continue current medication regimen and adjust as needed.  Lipid panel today.      Relevant Medications   metFORMIN (GLUCOPHAGE) 500 MG tablet   Other Relevant Orders   Bayer DCA Hb A1c Waived (Completed)   Comprehensive metabolic panel   Lipid Panel w/o Chol/HDL Ratio   Type 2 diabetes mellitus with morbid obesity (HCC) - Primary    Chronic, ongoing with A1C with upward trend to 9.3%, this is highest PCP has seen for her and suspect related to diet over summer.   - Discussed with her various options and highly recommend at this time we add on treatment, will start Metformin 500 MG BID to start and adjust as needed based on sugars at home and A1c.  She prefers to start out with this regimen vs GLP1 or SGLT2. - Continue Olmesartan for kidney protection due to urine ALB 30 February 2023, some improvement from previous of 80 two year ago.   - Continue to  monitor BS at home daily and document + focus on diet.   - Refuses vaccinations. - Eye and foot exam up to date. - Return in 6 weeks.      Relevant Medications   metFORMIN (GLUCOPHAGE) 500 MG tablet   Other Relevant Orders   Bayer DCA Hb A1c Waived (Completed)   Comprehensive metabolic panel     Other   History of endometrial cancer    Stable, continue collaboration with GYN -- at this time she does not want any further internal exams performed and reports no symptoms present -- if symptoms present she agrees will notify GYN or PCP.      Obesity    BMI 32.00, some gain present.  Recommended eating smaller high protein, low fat meals more frequently and exercising 30 mins a day 5 times a week with a goal of 10-15lb weight loss in the next 3 months. Patient voiced their understanding and motivation to adhere to these recommendations.       Relevant Medications   metFORMIN (GLUCOPHAGE) 500 MG tablet   Urge incontinence of urine    Over past months, ?related to diabetes with elevation in A1c >9%.  At this time recommend she continue at home regimen and add on pelvic floor exercises, discussed these with her and benefit to pelvic health.  She will try this, prefers to avoid medications.        Follow up plan: Return in about 6 weeks (around 05/15/2022) for T2DM.

## 2022-04-04 LAB — COMPREHENSIVE METABOLIC PANEL
ALT: 47 IU/L — ABNORMAL HIGH (ref 0–32)
AST: 30 IU/L (ref 0–40)
Albumin/Globulin Ratio: 1.7 (ref 1.2–2.2)
Albumin: 4.3 g/dL (ref 3.8–4.8)
Alkaline Phosphatase: 96 IU/L (ref 44–121)
BUN/Creatinine Ratio: 14 (ref 12–28)
BUN: 11 mg/dL (ref 8–27)
Bilirubin Total: 0.8 mg/dL (ref 0.0–1.2)
CO2: 21 mmol/L (ref 20–29)
Calcium: 9.6 mg/dL (ref 8.7–10.3)
Chloride: 104 mmol/L (ref 96–106)
Creatinine, Ser: 0.81 mg/dL (ref 0.57–1.00)
Globulin, Total: 2.6 g/dL (ref 1.5–4.5)
Glucose: 193 mg/dL — ABNORMAL HIGH (ref 70–99)
Potassium: 4.5 mmol/L (ref 3.5–5.2)
Sodium: 142 mmol/L (ref 134–144)
Total Protein: 6.9 g/dL (ref 6.0–8.5)
eGFR: 77 mL/min/{1.73_m2} (ref >59)

## 2022-04-04 LAB — LIPID PANEL W/O CHOL/HDL RATIO
Cholesterol, Total: 138 mg/dL (ref 100–199)
HDL: 36 mg/dL — ABNORMAL LOW (ref >39)
LDL Chol Calc (NIH): 74 mg/dL (ref 0–99)
Triglycerides: 159 mg/dL — ABNORMAL HIGH (ref 0–149)
VLDL Cholesterol Cal: 28 mg/dL (ref 5–40)

## 2022-04-04 NOTE — Progress Notes (Signed)
**Note De-Identified [place] Obfuscation** Good morning, please let Suzanne Jennings know her labs have returned: - Kidney function, creatinine and eGFR, remains normal.  Liver function shows very mild elevation in ALT, but normal AST which is baseline for you.  Sugar was elevated at 193. - Cholesterol labs show stable LDL, but triglycerides remain a little elevated -- continue medication.  Have a great day!!  Any questions? Keep being amazing!!  Thank you for allowing me to participate in your care.  I appreciate you. Kindest regards, [place] Pam

## 2022-05-12 NOTE — Patient Instructions (Signed)
**Note De-identified [place] Obfuscation** Diabetes Mellitus and Nutrition, Adult When you have diabetes, or diabetes mellitus, it is very important to have healthy eating habits because your blood sugar (glucose) levels are greatly affected by what you eat and drink. Eating healthy foods in the right amounts, at about the same times every day, can help you: Manage your blood glucose. Lower your risk of heart disease. Improve your blood pressure. Reach or maintain a healthy weight. What can affect my meal plan? Every person with diabetes is different, and each person has different needs for a meal plan. Your health care provider may recommend that you work with a dietitian to make a meal plan that is best for you. Your meal plan may vary depending on factors such as: The calories you need. The medicines you take. Your weight. Your blood glucose, blood pressure, and cholesterol levels. Your activity level. Other health conditions you have, such as heart or kidney disease. How do carbohydrates affect me? Carbohydrates, also called carbs, affect your blood glucose level more than any other type of food. Eating carbs raises the amount of glucose in your blood. It is important to know how many carbs you can safely have in each meal. This is different for every person. Your dietitian can help you calculate how many carbs you should have at each meal and for each snack. How does alcohol affect me? Alcohol can cause a decrease in blood glucose (hypoglycemia), especially if you use insulin or take certain diabetes medicines by mouth. Hypoglycemia can be a life-threatening condition. Symptoms of hypoglycemia, such as sleepiness, dizziness, and confusion, are similar to symptoms of having too much alcohol. Do not drink alcohol if: Your health care provider tells you not to drink. You are pregnant, may be pregnant, or are planning to become pregnant. If you drink alcohol: Limit how much you have to: 0-1 drink a day for women. 0-2 drinks a day  for men. Know how much alcohol is in your drink. In the [place] one drink equals one 12 oz bottle of beer (355 mL), one 5 oz glass of wine (148 mL), or one 1 oz glass of hard liquor (44 mL). Keep yourself hydrated with water, diet soda, or unsweetened iced tea. Keep in mind that regular soda, juice, and other mixers may contain a lot of sugar and must be counted as carbs. What are tips for following this plan?  Reading food labels Start by checking the serving size on the Nutrition Facts label of packaged foods and drinks. The number of calories and the amount of carbs, fats, and other nutrients listed on the label are based on one serving of the item. Many items contain more than one serving per package. Check the total grams (g) of carbs in one serving. Check the number of grams of saturated fats and trans fats in one serving. Choose foods that have a low amount or none of these fats. Check the number of milligrams (mg) of salt (sodium) in one serving. Most people should limit total sodium intake to less than 2,300 mg per day. Always check the nutrition information of foods labeled as "low-fat" or "nonfat." These foods may be higher in added sugar or refined carbs and should be avoided. Talk to your dietitian to identify your daily goals for nutrients listed on the label. Shopping Avoid buying canned, pre-made, or processed foods. These foods tend to be high in fat, sodium, and added sugar. Shop around the outside edge of the grocery store. This is where you  **Note De-identified [place] Obfuscation** will most often find fresh fruits and vegetables, bulk grains, fresh meats, and fresh dairy products. Cooking Use low-heat cooking methods, such as baking, instead of high-heat cooking methods, such as deep frying. Cook using healthy oils, such as olive, canola, or sunflower oil. Avoid cooking with butter, cream, or high-fat meats. Meal planning Eat meals and snacks regularly, preferably at the same times every day. Avoid going long periods  of time without eating. Eat foods that are high in fiber, such as fresh fruits, vegetables, beans, and whole grains. Eat 4-6 oz (112-168 g) of lean protein each day, such as lean meat, chicken, fish, eggs, or tofu. One ounce (oz) (28 g) of lean protein is equal to: 1 oz (28 g) of meat, chicken, or fish. 1 egg.  cup (62 g) of tofu. Eat some foods each day that contain healthy fats, such as avocado, nuts, seeds, and fish. What foods should I eat? Fruits Berries. Apples. Oranges. Peaches. Apricots. Plums. Grapes. Mangoes. Papayas. Pomegranates. Kiwi. Cherries. Vegetables Leafy greens, including lettuce, spinach, kale, chard, collard greens, mustard greens, and cabbage. Beets. Cauliflower. Broccoli. Carrots. Green beans. Tomatoes. Peppers. Onions. Cucumbers. Brussels sprouts. Grains Whole grains, such as whole-wheat or whole-grain bread, crackers, tortillas, cereal, and pasta. Unsweetened oatmeal. Quinoa. Brown or wild rice. Meats and other proteins Seafood. Poultry without skin. Lean cuts of poultry and beef. Tofu. Nuts. Seeds. Dairy Low-fat or fat-free dairy products such as milk, yogurt, and cheese. The items listed above may not be a complete list of foods and beverages you can eat and drink. Contact a dietitian for more information. What foods should I avoid? Fruits Fruits canned with syrup. Vegetables Canned vegetables. Frozen vegetables with butter or cream sauce. Grains Refined white flour and flour products such as bread, pasta, snack foods, and cereals. Avoid all processed foods. Meats and other proteins Fatty cuts of meat. Poultry with skin. Breaded or fried meats. Processed meat. Avoid saturated fats. Dairy Full-fat yogurt, cheese, or milk. Beverages Sweetened drinks, such as soda or iced tea. The items listed above may not be a complete list of foods and beverages you should avoid. Contact a dietitian for more information. Questions to ask a health care provider Do I need  to meet with a certified diabetes care and education specialist? Do I need to meet with a dietitian? What number can I call if I have questions? When are the best times to check my blood glucose? Where to find more information: American Diabetes Association: diabetes.org Academy of Nutrition and Dietetics: eatright.org National Institute of Diabetes and Digestive and Kidney Diseases: niddk.nih.gov Association of Diabetes Care & Education Specialists: diabeteseducator.org Summary It is important to have healthy eating habits because your blood sugar (glucose) levels are greatly affected by what you eat and drink. It is important to use alcohol carefully. A healthy meal plan will help you manage your blood glucose and lower your risk of heart disease. Your health care provider may recommend that you work with a dietitian to make a meal plan that is best for you. This information is not intended to replace advice given to you by your health care provider. Make sure you discuss any questions you have with your health care provider. Document Revised: 03/03/2020 Document Reviewed: 03/03/2020 Elsevier Patient Education  2023 Elsevier Inc.  

## 2022-05-15 ENCOUNTER — Ambulatory Visit: Payer: Medicare Other | Admitting: Nurse Practitioner

## 2022-05-18 ENCOUNTER — Ambulatory Visit (INDEPENDENT_AMBULATORY_CARE_PROVIDER_SITE_OTHER): Payer: Medicare Other | Admitting: Nurse Practitioner

## 2022-05-18 ENCOUNTER — Encounter: Payer: Self-pay | Admitting: Nurse Practitioner

## 2022-05-18 DIAGNOSIS — E1169 Type 2 diabetes mellitus with other specified complication: Secondary | ICD-10-CM

## 2022-05-18 DIAGNOSIS — Z1211 Encounter for screening for malignant neoplasm of colon: Secondary | ICD-10-CM | POA: Diagnosis not present

## 2022-05-18 NOTE — Assessment & Plan Note (Addendum)
**Note De-Identified [place] Obfuscation** Chronic, ongoing with A1c upward trend to 9.3% recent visit, this is highest PCP has seen for her and suspect related to diet over summer and weight gain -- she is working on changes. - Continue Metformin 500 MG BID as is tolerating, but could consider change to or addition of SGLT2 next visit.  She prefers to avoid injectables. Sugars are coming down but not at goal of <130 in morning, discussed with patient. - Continue Olmesartan for kidney protection due to urine ALB 30 February 2023, some improvement from previous of 80 two year ago.   - Continue to monitor BS at home daily and document + focus on diet.   - Refuses vaccinations. - Eye and foot exam up to date. - Return in 2 months.

## 2022-05-18 NOTE — Progress Notes (Signed)
**Note De-Identified [place] Obfuscation** BP 128/70 (BP Location: Left Arm, Patient Position: Sitting, Cuff Size: Normal)   Pulse 74   Temp 98 F (36.7 C) (Oral)   Ht 5' 0.51" (1.537 m)   Wt 163 lb 12.8 oz (74.3 kg)   LMP  (LMP Unknown)   SpO2 95%   BMI 31.45 kg/m    Subjective:    Patient ID: Suzanne Jennings, female    DOB: Dec 13, 1950, 71 y.o.   MRN: [id number]  HPI: Gayla Benn Mormile is a 71 y.o. female  Chief Complaint  Patient presents with   Diabetes   DIABETES Last visit 9.3% and started Metformin 500 MG BID -- tolerating this but would prefer to take something different in future (no injectables -- discussed SGLT2).  Has never taken medication for her diabetes prior to this.  She has been focused on diet and activity -- is walking daily.   Hypoglycemic episodes:no Polydipsia/polyuria: no Visual disturbance: no Chest pain: no Paresthesias: no Glucose Monitoring: yes             Accucheck frequency: daily             Fasting glucose: 169 this morning -- average 150             Post prandial:             Evening:             Before meals: Taking Insulin?: no             Long acting insulin:             Short acting insulin: Blood Pressure Monitoring: daily Retinal Examination: Up to Date  Foot Exam: Up to Date Pneumovax: refused Influenza: refused Aspirin: yes    Relevant past medical, surgical, family and social history reviewed and updated as indicated. Interim medical history since our last visit reviewed. Allergies and medications reviewed and updated.  Review of Systems  Constitutional:  Negative for activity change, appetite change, diaphoresis, fatigue and fever.  Respiratory:  Negative for cough, chest tightness and shortness of breath.   Cardiovascular:  Negative for chest pain, palpitations and leg swelling.  Gastrointestinal: Negative.   Endocrine: Negative for polydipsia, polyphagia and polyuria.  Neurological: Negative.   Psychiatric/Behavioral: Negative.      Per HPI unless specifically  indicated above     Objective:    BP 128/70 (BP Location: Left Arm, Patient Position: Sitting, Cuff Size: Normal)   Pulse 74   Temp 98 F (36.7 C) (Oral)   Ht 5' 0.51" (1.537 m)   Wt 163 lb 12.8 oz (74.3 kg)   LMP  (LMP Unknown)   SpO2 95%   BMI 31.45 kg/m   Wt Readings from Last 3 Encounters:  05/18/22 163 lb 12.8 oz (74.3 kg)  04/03/22 166 lb 9.6 oz (75.6 kg)  09/27/21 154 lb 6.4 oz (70 kg)    Physical Exam Vitals and nursing note reviewed.  Constitutional:      General: She is awake. She is not in acute distress.    Appearance: She is well-developed and well-groomed. She is obese. She is not ill-appearing.  HENT:     Head: Normocephalic.     Right Ear: Hearing normal.     Left Ear: Hearing normal.  Eyes:     General: Lids are normal.        Right eye: No discharge.        Left eye: No discharge. **Note De-Identified [place] Obfuscation** Conjunctiva/sclera: Conjunctivae normal.     Pupils: Pupils are equal, round, and reactive to light.  Neck:     Thyroid: No thyromegaly.     Vascular: No carotid bruit.  Cardiovascular:     Rate and Rhythm: Normal rate and regular rhythm.     Heart sounds: Normal heart sounds. No murmur heard.    No gallop.  Pulmonary:     Effort: Pulmonary effort is normal. No accessory muscle usage or respiratory distress.     Breath sounds: Normal breath sounds.  Abdominal:     General: Bowel sounds are normal.     Palpations: Abdomen is soft.  Musculoskeletal:     Cervical back: Normal range of motion and neck supple.     Right lower leg: No edema.     Left lower leg: No edema.  Skin:    General: Skin is warm and dry.  Neurological:     Mental Status: She is alert and oriented to person, place, and time.  Psychiatric:        Attention and Perception: Attention normal.        Mood and Affect: Mood normal.        Speech: Speech normal.        Behavior: Behavior normal. Behavior is cooperative.        Thought Content: Thought content normal.    Results for orders placed  or performed in visit on 04/03/22  Bayer DCA Hb A1c Waived  Result Value Ref Range   HB A1C (BAYER DCA - WAIVED) 9.3 (H) 4.8 - 5.6 %  Comprehensive metabolic panel  Result Value Ref Range   Glucose 193 (H) 70 - 99 mg/dL   BUN 11 8 - 27 mg/dL   Creatinine, Ser 0.81 0.57 - 1.00 mg/dL   eGFR 77 >59 mL/min/1.73   BUN/Creatinine Ratio 14 12 - 28   Sodium 142 134 - 144 mmol/L   Potassium 4.5 3.5 - 5.2 mmol/L   Chloride 104 96 - 106 mmol/L   CO2 21 20 - 29 mmol/L   Calcium 9.6 8.7 - 10.3 mg/dL   Total Protein 6.9 6.0 - 8.5 g/dL   Albumin 4.3 3.8 - 4.8 g/dL   Globulin, Total 2.6 1.5 - 4.5 g/dL   Albumin/Globulin Ratio 1.7 1.2 - 2.2   Bilirubin Total 0.8 0.0 - 1.2 mg/dL   Alkaline Phosphatase 96 44 - 121 IU/L   AST 30 0 - 40 IU/L   ALT 47 (H) 0 - 32 IU/L  Lipid Panel w/o Chol/HDL Ratio  Result Value Ref Range   Cholesterol, Total 138 100 - 199 mg/dL   Triglycerides 159 (H) 0 - 149 mg/dL   HDL 36 (L) >39 mg/dL   VLDL Cholesterol Cal 28 5 - 40 mg/dL   LDL Chol Calc (NIH) 74 0 - 99 mg/dL      Assessment & Plan:   Problem List Items Addressed This Visit       Endocrine   Type 2 diabetes mellitus with morbid obesity (HCC) - Primary    Chronic, ongoing with A1c upward trend to 9.3% recent visit, this is highest PCP has seen for her and suspect related to diet over summer and weight gain -- she is working on changes. - Continue Metformin 500 MG BID as is tolerating, but could consider change to or addition of SGLT2 next visit.  She prefers to avoid injectables. Sugars are coming down but not at goal of <130 in morning, discussed with patient. - **Note De-Identified [place] Obfuscation** Continue Olmesartan for kidney protection due to urine ALB 30 February 2023, some improvement from previous of 80 two year ago.   - Continue to monitor BS at home daily and document + focus on diet.   - Refuses vaccinations. - Eye and foot exam up to date. - Return in 2 months.      Other Visit Diagnoses     Colon cancer screening       GI  referral in place.   Relevant Orders   Ambulatory referral to Gastroenterology        Follow up plan: Return in about 2 months (around 07/18/2022) for T2DM, HTN/HLD.

## 2022-06-16 ENCOUNTER — Other Ambulatory Visit: Payer: Self-pay | Admitting: Nurse Practitioner

## 2022-06-19 NOTE — Telephone Encounter (Signed)
**Note De-Identified [place] Obfuscation** Refilled 03/15/2022 #[id number] [phone] rf. Requested Prescriptions  Pending Prescriptions Disp Refills   atorvastatin (LIPITOR) 80 MG tablet [Pharmacy Med Name: Atorvastatin Calcium 80 MG Oral Tablet] 100 tablet 2    Sig: TAKE 1 TABLET BY MOUTH ONCE  DAILY     Cardiovascular:  Antilipid - Statins Failed - 06/16/2022 10:38 PM      Failed - Lipid Panel in normal range within the last 12 months    Cholesterol, Total  Date Value Ref Range Status  04/03/2022 138 100 - 199 mg/dL Final   Cholesterol Piccolo, Waived  Date Value Ref Range Status  06/29/2017 169 <200 mg/dL Final    Comment:                            Desirable                <200                         Borderline High      200- 239                         High                     >239    LDL Chol Calc (NIH)  Date Value Ref Range Status  04/03/2022 74 0 - 99 mg/dL Final   HDL  Date Value Ref Range Status  04/03/2022 36 (L) >39 mg/dL Final   Triglycerides  Date Value Ref Range Status  04/03/2022 159 (H) 0 - 149 mg/dL Final   Triglycerides Piccolo,Waived  Date Value Ref Range Status  06/29/2017 179 (H) <150 mg/dL Final    Comment:                            Normal                   <150                         Borderline High     150 - 199                         High                200 - 499                         Very High                >499          Passed - Patient is not pregnant      Passed - Valid encounter within last 12 months    Recent Outpatient Visits           1 month ago Type 2 diabetes mellitus with morbid obesity ([place])   [place], Jolene T, NP   2 months ago Type 2 diabetes mellitus with morbid obesity ([place])   [place], Jolene T, NP   8 months ago Type 2 diabetes mellitus with morbid obesity ([place])   [place], Culver T, NP   1 year ago Type 2 diabetes mellitus with morbid obesity ([place]) **Note De-Identified [place] Obfuscation** [facility] [place], Henrine Screws T,  NP   1 year ago Hypertension associated with diabetes [facility])   [place], Barbaraann Faster, NP       Future Appointments             In 4 weeks [place], Barbaraann Faster, NP [employer]

## 2022-07-18 ENCOUNTER — Ambulatory Visit: Payer: Medicare Other | Admitting: Nurse Practitioner

## 2022-08-05 ENCOUNTER — Other Ambulatory Visit: Payer: Self-pay | Admitting: Nurse Practitioner

## 2022-08-07 NOTE — Patient Instructions (Signed)
**Note De-identified [place] Obfuscation** Diabetes Mellitus Basics  Diabetes mellitus, or diabetes, is a long-term (chronic) disease. It occurs when the body does not properly use sugar (glucose) that is released from food after you eat. Diabetes mellitus may be caused by one or both of these problems: Your pancreas does not make enough of a hormone called insulin. Your body does not react in a normal way to the insulin that it makes. Insulin lets glucose enter cells in your body. This gives you energy. If you have diabetes, glucose cannot get into cells. This causes high blood glucose (hyperglycemia). How to treat and manage diabetes You may need to take insulin or other diabetes medicines daily to keep your glucose in balance. If you are prescribed insulin, you will learn how to give yourself insulin by injection. You may need to adjust the amount of insulin you take based on the foods that you eat. You will need to check your blood glucose levels using a glucose monitor as told by your health care provider. The readings can help determine if you have low or high blood glucose. Generally, you should have these blood glucose levels: Before meals (preprandial): 80-130 mg/dL (4.4-7.2 mmol/L). After meals (postprandial): below 180 mg/dL (10 mmol/L). Hemoglobin A1c (HbA1c) level: less than 7%. Your health care provider will set treatment goals for you. Keep all follow-up visits. This is important. Follow these instructions at home: Diabetes medicines Take your diabetes medicines every day as told by your health care provider. List your diabetes medicines here: Name of medicine: ______________________________ Amount (dose): _______________ Time (a.m./p.m.): _______________ Notes: ___________________________________ Name of medicine: ______________________________ Amount (dose): _______________ Time (a.m./p.m.): _______________ Notes: ___________________________________ Name of medicine: ______________________________ Amount (dose):  _______________ Time (a.m./p.m.): _______________ Notes: ___________________________________ Insulin If you use insulin, list the types of insulin you use here: Insulin type: ______________________________ Amount (dose): _______________ Time (a.m./p.m.): _______________Notes: ___________________________________ Insulin type: ______________________________ Amount (dose): _______________ Time (a.m./p.m.): _______________ Notes: ___________________________________ Insulin type: ______________________________ Amount (dose): _______________ Time (a.m./p.m.): _______________ Notes: ___________________________________ Insulin type: ______________________________ Amount (dose): _______________ Time (a.m./p.m.): _______________ Notes: ___________________________________ Insulin type: ______________________________ Amount (dose): _______________ Time (a.m./p.m.): _______________ Notes: ___________________________________ Managing blood glucose  Check your blood glucose levels using a glucose monitor as told by your health care provider. Write down the times that you check your glucose levels here: Time: _______________ Notes: ___________________________________ Time: _______________ Notes: ___________________________________ Time: _______________ Notes: ___________________________________ Time: _______________ Notes: ___________________________________ Time: _______________ Notes: ___________________________________ Time: _______________ Notes: ___________________________________  Low blood glucose Low blood glucose (hypoglycemia) is when glucose is at or below 70 mg/dL (3.9 mmol/L). Symptoms may include: Feeling: Hungry. Sweaty and clammy. Irritable or easily upset. Dizzy. Sleepy. Having: A fast heartbeat. A headache. A change in your vision. Numbness around the mouth, lips, or tongue. Having trouble with: Moving (coordination). Sleeping. Treating low blood glucose To treat low blood  glucose, eat or drink something containing sugar right away. If you can think clearly and swallow safely, follow the 15:15 rule: Take 15 grams of a fast-acting carb (carbohydrate), as told by your health care provider. Some fast-acting carbs are: Glucose tablets: take 3-4 tablets. Hard candy: eat 3-5 pieces. Fruit juice: drink 4 oz (120 mL). Regular (not diet) soda: drink 4-6 oz (120-180 mL). Honey or sugar: eat 1 Tbsp (15 mL). Check your blood glucose levels 15 minutes after you take the carb. If your glucose is still at or below 70 mg/dL (3.9 mmol/L), take 15 grams of a carb again. If your glucose does not go above 70 mg/dL (3.9 mmol/L) after  **Note De-identified [place] Obfuscation** 3 tries, get help right away. After your glucose goes back to normal, eat a meal or a snack within 1 hour. Treating very low blood glucose If your glucose is at or below 54 mg/dL (3 mmol/L), you have very low blood glucose (severe hypoglycemia). This is an emergency. Do not wait to see if the symptoms will go away. Get medical help right away. Call your local emergency services (911 in the [place] Do not drive yourself to the hospital. Questions to ask your health care provider Should I talk with a diabetes educator? What equipment will I need to care for myself at home? What diabetes medicines do I need? When should I take them? How often do I need to check my blood glucose levels? What number can I call if I have questions? When is my follow-up visit? Where can I find a support group for people with diabetes? Where to find more information American Diabetes Association: www.diabetes.org Association of Diabetes Care and Education Specialists: www.diabeteseducator.org Contact a health care provider if: Your blood glucose is at or above 240 mg/dL (13.3 mmol/L) for 2 days in a row. You have been sick or have had a fever for 2 days or more, and you are not getting better. You have any of these problems for more than 6 hours: You cannot eat or  drink. You feel nauseous. You vomit. You have diarrhea. Get help right away if: Your blood glucose is lower than 54 mg/dL (3 mmol/L). You get confused. You have trouble thinking clearly. You have trouble breathing. These symptoms may represent a serious problem that is an emergency. Do not wait to see if the symptoms will go away. Get medical help right away. Call your local emergency services (911 in the [place] Do not drive yourself to the hospital. Summary Diabetes mellitus is a chronic disease that occurs when the body does not properly use sugar (glucose) that is released from food after you eat. Take insulin and diabetes medicines as told. Check your blood glucose every day, as often as told. Keep all follow-up visits. This is important. This information is not intended to replace advice given to you by your health care provider. Make sure you discuss any questions you have with your health care provider. Document Revised: 12/02/2019 Document Reviewed: 12/02/2019 Elsevier Patient Education  2023 [employer]  

## 2022-08-08 ENCOUNTER — Ambulatory Visit: Payer: Medicare Other | Admitting: Nurse Practitioner

## 2022-08-11 ENCOUNTER — Ambulatory Visit (INDEPENDENT_AMBULATORY_CARE_PROVIDER_SITE_OTHER): Payer: Medicare Other | Admitting: Nurse Practitioner

## 2022-08-11 ENCOUNTER — Encounter: Payer: Self-pay | Admitting: Nurse Practitioner

## 2022-08-11 VITALS — BP 128/80 | HR 75 | Temp 97.6°F | Resp 18 | Ht 60.51 in | Wt 160.2 lb

## 2022-08-11 DIAGNOSIS — E785 Hyperlipidemia, unspecified: Secondary | ICD-10-CM | POA: Diagnosis not present

## 2022-08-11 DIAGNOSIS — Z0000 Encounter for general adult medical examination without abnormal findings: Secondary | ICD-10-CM | POA: Diagnosis not present

## 2022-08-11 DIAGNOSIS — E1169 Type 2 diabetes mellitus with other specified complication: Secondary | ICD-10-CM

## 2022-08-11 DIAGNOSIS — Z8542 Personal history of malignant neoplasm of other parts of uterus: Secondary | ICD-10-CM

## 2022-08-11 DIAGNOSIS — I152 Hypertension secondary to endocrine disorders: Secondary | ICD-10-CM

## 2022-08-11 DIAGNOSIS — Z6831 Body mass index (BMI) 31.0-31.9, adult: Secondary | ICD-10-CM

## 2022-08-11 DIAGNOSIS — I700 Atherosclerosis of aorta: Secondary | ICD-10-CM

## 2022-08-11 DIAGNOSIS — E6609 Other obesity due to excess calories: Secondary | ICD-10-CM

## 2022-08-11 DIAGNOSIS — Z7189 Other specified counseling: Secondary | ICD-10-CM

## 2022-08-11 DIAGNOSIS — E1159 Type 2 diabetes mellitus with other circulatory complications: Secondary | ICD-10-CM | POA: Diagnosis not present

## 2022-08-11 LAB — BAYER DCA HB A1C WAIVED: HB A1C (BAYER DCA - WAIVED): 8.9 % — ABNORMAL HIGH (ref 4.8–5.6)

## 2022-08-11 NOTE — Assessment & Plan Note (Signed)
**Note De-Identified [place] Obfuscation** Chronic, ongoing with A1c upward trend to 9.3%  August 2023, this is highest PCP has seen for her and suspect related to diet over summer and weight gain -- she is working on changes.  She stopped Metformin due to rash.  Recheck A1c today. - Continue off Metformin due to side effect.  Could consider change to SGLT2  or Januvia if ongoing elevations.  She prefers to avoid injectables.  - Continue Olmesartan for kidney protection due to urine ALB 30 February 2023, some improvement from previous of 80 two year ago.   - Continue to monitor BS at home daily and document + focus on diet.   - Refuses vaccinations. - Eye and foot exam up to date. - Return in 3 months.

## 2022-08-11 NOTE — Assessment & Plan Note (Signed)
**Note De-Identified [place] Obfuscation** Ongoing and stable.  Noted on CT 06/03/18.  Continue daily statin and ASA for prevention.

## 2022-08-11 NOTE — Progress Notes (Signed)
**Note De-Identified Rossie Obfuscation** BP 128/80 (BP Location: Left Arm, Patient Position: Sitting, Cuff Size: Normal)   Pulse 75   Temp 97.6 F (36.4 C) (Oral)   Resp 18   Ht 5' 0.51" (1.537 m)   Wt 160 lb 3.2 oz (72.7 kg)   LMP  (LMP Unknown)   SpO2 97%   BMI 30.76 kg/m    Subjective:    Patient ID: Suzanne Jennings, female    DOB: 06/01/1951, 71 y.o.   MRN: [id number]  HPI: Suzanne Jennings is a 71 y.o. female presenting on 08/11/2022 for Diabetes follow-up and Medicare Wellnesss. Current medical complaints include:none  She currently lives with: husband Menopausal Symptoms: no  DIABETES Last A1c 9.3% in August, was started on Metformin 500 MG BID -- two weeks after she started taking she noticed a red band around neck and swelling. She stopped taking the Metformin, rash improved gradually.  Prior to this she had not been on medication for diabetes, had been diet focused only.  Has been taking a supplement to help with sugar levels, Sona Gold.  Has been walking daily.  Is down 6 pounds since August. Hypoglycemic episodes:no Polydipsia/polyuria: no Visual disturbance: no Chest pain: no Paresthesias: no Glucose Monitoring: yes             Accucheck frequency: every other day             Fasting glucose: 200 today             Post prandial:             Evening:             Before meals: Taking Insulin?: no             Long acting insulin:             Short acting insulin: Blood Pressure Monitoring: daily Retinal Examination: Up to Date  Foot Exam: Up to Date Pneumovax: refuses Influenza: refuses Aspirin: yes    HYPERTENSION / HYPERLIPIDEMIA Continues on Atorvastatin,Olmesartan, + ASA.  CT scan in 2019 noted aortic atherosclerosis. Satisfied with current treatment? yes Duration of hypertension: chronic BP monitoring frequency: daily BP range: 120-130/60-70  range BP medication side effects: no Duration of hyperlipidemia: chronic Cholesterol medication side effects: no Cholesterol supplements:  none Medication compliance: good compliance Aspirin: yes Recent stressors: no Recurrent headaches: no Visual changes: no Palpitations: no Dyspnea: no Chest pain: no Lower extremity edema: no Dizzy/lightheaded: no    ENDOMETRIAL CANCER: History of endometrial cancer (grade 1 endometrial cancer with focal polyp) in June 2019, a complete hysterectomy was performed. No chemo or radiation.  Followed by GYN in past, last visit 03/26/19. Reports overall doing well and does not want any further internal exams.  Denies any symptoms.       08/11/2022   10:41 AM 05/18/2022   10:30 AM 04/03/2022   10:34 AM 09/27/2021    1:25 PM 08/09/2021   10:11 AM  Depression screen PHQ 2/9  Decreased Interest 0 0 0 0 0  Down, Depressed, Hopeless 0 0 0 0 0  PHQ - 2 Score 0 0 0 0 0  Altered sleeping 0 0 0 0   Tired, decreased energy 0 0 0 0   Change in appetite 0 0 0 0   Feeling bad or failure about yourself  0 0 0 0   Trouble concentrating 0 0 0 0   Moving slowly or fidgety/restless 0 0 0 0   Suicidal thoughts 0 **Note De-Identified Milley Obfuscation** 0 0 0   PHQ-9 Score 0 0 0 0   Difficult doing work/chores Not difficult at all Not difficult at all Not difficult at all         09/27/2021    1:24 PM 04/03/2022   10:33 AM 05/18/2022   10:30 AM 08/11/2022   10:41 AM 08/11/2022   11:08 AM  Fall Risk  Falls in the past year? 0 0 0 0 0  Was there an injury with Fall? 0 0 0 0 0  Fall Risk Category Calculator 0 0 0 0 0  Fall Risk Category _0   Patient Fall Risk Level Low fall risk Low fall risk   Low fall risk  Patient at Risk for Falls Due to No Fall Risks No Fall Risks No Fall Risks  No Fall Risks  Fall risk Follow up Falls evaluation completed Falls evaluation completed Falls evaluation completed Falls evaluation completed Falls prevention discussed    Functional Status Survey: Is the patient deaf or have difficulty hearing?: No Does the patient have difficulty seeing, even when wearing glasses/contacts?: No Does the  patient have difficulty concentrating, remembering, or making decisions?: No Does the patient have difficulty walking or climbing stairs?: No Does the patient have difficulty dressing or bathing?: No Does the patient have difficulty doing errands alone such as visiting a doctor's office or shopping?: No   A voluntary discussion about advance care planning including the explanation and discussion of advance directives was extensively discussed  with the patient for 10 minutes with patient present.  Explanation about the health care proxy and Living will was reviewed and packet with forms with explanation of how to fill them out was given.  During this discussion, the patient was able to identify a health care proxy as family member in [place] and plans to fill out the paperwork required.  Patient was offered a separate [place] visit for further assistance with forms.  Will bring forms in to scan.   Past Medical History:  Past Medical History:  Diagnosis Date   Diabetes mellitus without complication ([place])    Endometrial cancer ([place])    History of kidney stones    Hyperlipidemia    Hypertension    Kidney stone    Knee pain     Surgical History:  Past Surgical History:  Procedure Laterality Date   APPENDECTOMY     COLONOSCOPY WITH PROPOFOL N/A 07/03/2016   Procedure: COLONOSCOPY WITH PROPOFOL;  Surgeon: Lucilla Lame, MD;  Location: [facility];  Service: Endoscopy;  Laterality: N/A;   LAPAROSCOPIC HYSTERECTOMY N/A 02/20/2018   Procedure: HYSTERECTOMY TOTAL LAPAROSCOPIC, WITH BILATERAL SALPINGOOPHERECTOMY;  Surgeon: Mellody Drown, MD;  Location: [facility];  Service: Gynecology;  Laterality: N/A;   POLYPECTOMY  07/03/2016   Procedure: POLYPECTOMY;  Surgeon: Lucilla Lame, MD;  Location: [facility];  Service: Endoscopy;;   SENTINEL NODE BIOPSY N/A 02/20/2018   Procedure: SENTINEL NODE BIOPSY AND MAPPING;  Surgeon: Mellody Drown, MD;  Location: [facility];  Service:  Gynecology;  Laterality: N/A;   TUBAL LIGATION      Medications:  Current Outpatient Medications on File Prior to Visit  Medication Sig   aspirin EC 81 MG tablet Take 1 tablet (81 mg total) by mouth daily.   atorvastatin (LIPITOR) 80 MG tablet TAKE 1 TABLET BY MOUTH ONCE  DAILY   Blood Glucose Monitoring Suppl ([place]) w/Device KIT Use to check blood sugar 3-4 times a day and document **Note De-Identified Justin Obfuscation** for provider visits.  Goal less then 130 in morning fasting and less then 180 two hours after a meal.   cholecalciferol (VITAMIN D3) 25 MCG (1000 UNIT) tablet Take 1,000 Units by mouth daily.   olmesartan (BENICAR) 20 MG tablet TAKE 1 TABLET BY MOUTH DAILY   ONETOUCH VERIO test strip Use to check sugar 2-3 times daily.   OVER THE COUNTER MEDICATION Take 1 capsule by mouth daily. Zendocrine otc supplement   Probiotic Product (PROBIOTIC PO) Take 1 capsule by mouth daily.    No current facility-administered medications on file prior to visit.    Allergies:  Allergies  Allergen Reactions   Metformin And Related Rash   Aspirin Other (See Comments)    "blood thinner, makes nose bleed - but pt can take 81 mg dose without problems   Other Other (See Comments)    Yogurt causes yeast infections Nuts except peanuts and almonds cause anaphylaxis    Penicillins Swelling and Rash    Has patient had a PCN reaction causing immediate rash, facial/tongue/throat swelling, SOB or lightheadedness with hypotension: Yes Has patient had a PCN reaction causing severe rash involving mucus membranes or skin necrosis: No Has patient had a PCN reaction that required hospitalization: No Has patient had a PCN reaction occurring within the last 10 years: No If all of the above answers are "NO", then may proceed with Cephalosporin use.    Sulfa Antibiotics Swelling and Rash    Social History:  Social History   Socioeconomic History   Marital status: Married    Spouse name: Not on file   Number of  children: Not on file   Years of education: Not on file   Highest education level: Not on file  Occupational History   Occupation: retired  Tobacco Use   Smoking status: Never   Smokeless tobacco: Never  Vaping Use   Vaping Use: Never used  Substance and Sexual Activity   Alcohol use: No    Alcohol/week: 0.0 standard drinks of alcohol   Drug use: No   Sexual activity: Yes    Birth control/protection: Post-menopausal  Other Topics Concern   Not on file  Social History Narrative   Not on file   Social Determinants of Health   Financial Resource Strain: Low Risk  (08/11/2022)   Overall Financial Resource Strain (CARDIA)    Difficulty of Paying Living Expenses: Not hard at all  Food Insecurity: No Food Insecurity (08/11/2022)   Hunger Vital Sign    Worried About Running Out of Food in the Last Year: Never true    [place] Two in the Last Year: Never true  Transportation Needs: No Transportation Needs (08/11/2022)   PRAPARE - Hydrologist (Medical): No    Lack of Transportation (Non-Medical): No  Physical Activity: Sufficiently Active (08/11/2022)   Exercise Vital Sign    Days of Exercise per Week: 4 days    Minutes of Exercise per Session: 40 min  Stress: No Stress Concern Present (08/11/2022)   [place]    Feeling of Stress : Not at all  Social Connections: [place] (08/11/2022)   Social Connection and Isolation Panel [NHANES]    Frequency of Communication with Friends and Family: More than three times a week    Frequency of Social Gatherings with Friends and Family: More than three times a week    Attends Religious Services: More than 4 times per year **Note De-Identified Rhett Obfuscation** Active Member of Clubs or Organizations: Yes    Attends Archivist Meetings: More than 4 times per year    Marital Status: Married  Human resources officer Violence: Not At Risk (08/11/2022)   Humiliation,  Afraid, Rape, and Kick questionnaire    Fear of Current or Ex-Partner: No    Emotionally Abused: No    Physically Abused: No    Sexually Abused: No   Social History   Tobacco Use  Smoking Status Never  Smokeless Tobacco Never   Social History   Substance and Sexual Activity  Alcohol Use No   Alcohol/week: 0.0 standard drinks of alcohol    Family History:  Family History  Problem Relation Age of Onset   Emphysema Mother    Heart disease Father        massive MI   Hypertension Sister    Edema Sister    Hypertension Sister    Hypertension Sister    Mental illness Brother    Diabetes Maternal Grandmother    Breast cancer Neg Hx     Past medical history, surgical history, medications, allergies, family history and social history reviewed with patient today and changes made to appropriate areas of the chart.   ROS All other ROS negative except what is listed above and in the HPI.      Objective:    BP 128/80 (BP Location: Left Arm, Patient Position: Sitting, Cuff Size: Normal)   Pulse 75   Temp 97.6 F (36.4 C) (Oral)   Resp 18   Ht 5' 0.51" (1.537 m)   Wt 160 lb 3.2 oz (72.7 kg)   LMP  (LMP Unknown)   SpO2 97%   BMI 30.76 kg/m   Wt Readings from Last 3 Encounters:  08/11/22 160 lb 3.2 oz (72.7 kg)  05/18/22 163 lb 12.8 oz (74.3 kg)  04/03/22 166 lb 9.6 oz (75.6 kg)    Physical Exam Vitals and nursing note reviewed.  Constitutional:      General: She is awake. She is not in acute distress.    Appearance: She is well-developed and well-groomed. She is obese. She is not ill-appearing.  HENT:     Head: Normocephalic.     Right Ear: Hearing normal.     Left Ear: Hearing normal.  Eyes:     General: Lids are normal.        Right eye: No discharge.        Left eye: No discharge.     Conjunctiva/sclera: Conjunctivae normal.     Pupils: Pupils are equal, round, and reactive to light.  Neck:     Thyroid: No thyromegaly.     Vascular: No carotid bruit.   Cardiovascular:     Rate and Rhythm: Normal rate and regular rhythm.     Heart sounds: Normal heart sounds. No murmur heard.    No gallop.  Pulmonary:     Effort: Pulmonary effort is normal. No accessory muscle usage or respiratory distress.     Breath sounds: Normal breath sounds.  Abdominal:     General: Bowel sounds are normal.     Palpations: Abdomen is soft.  Musculoskeletal:     Cervical back: Normal range of motion and neck supple.     Right lower leg: No edema.     Left lower leg: No edema.  Skin:    General: Skin is warm and dry.  Neurological:     Mental Status: She is alert and oriented to person, place, and **Note De-Identified Kozub Obfuscation** time.  Psychiatric:        Attention and Perception: Attention normal.        Mood and Affect: Mood normal.        Speech: Speech normal.        Behavior: Behavior normal. Behavior is cooperative.        Thought Content: Thought content normal.     Results for orders placed or performed in visit on 04/03/22  Bayer DCA Hb A1c Waived  Result Value Ref Range   HB A1C (BAYER DCA - WAIVED) 9.3 (H) 4.8 - 5.6 %  Comprehensive metabolic panel  Result Value Ref Range   Glucose 193 (H) 70 - 99 mg/dL   BUN 11 8 - 27 mg/dL   Creatinine, Ser 0.81 0.57 - 1.00 mg/dL   eGFR 77 >59 mL/min/1.73   BUN/Creatinine Ratio 14 12 - 28   Sodium 142 134 - 144 mmol/L   Potassium 4.5 3.5 - 5.2 mmol/L   Chloride 104 96 - 106 mmol/L   CO2 21 20 - 29 mmol/L   Calcium 9.6 8.7 - 10.3 mg/dL   Total Protein 6.9 6.0 - 8.5 g/dL   Albumin 4.3 3.8 - 4.8 g/dL   Globulin, Total 2.6 1.5 - 4.5 g/dL   Albumin/Globulin Ratio 1.7 1.2 - 2.2   Bilirubin Total 0.8 0.0 - 1.2 mg/dL   Alkaline Phosphatase 96 44 - 121 IU/L   AST 30 0 - 40 IU/L   ALT 47 (H) 0 - 32 IU/L  Lipid Panel w/o Chol/HDL Ratio  Result Value Ref Range   Cholesterol, Total 138 100 - 199 mg/dL   Triglycerides 159 (H) 0 - 149 mg/dL   HDL 36 (L) >39 mg/dL   VLDL Cholesterol Cal 28 5 - 40 mg/dL   LDL Chol Calc (NIH) 74 0 - 99  mg/dL      Assessment & Plan:   Problem List Items Addressed This Visit       Cardiovascular and Mediastinum   Aortic atherosclerosis ([place])    Ongoing and stable.  Noted on CT 06/03/18.  Continue daily statin and ASA for prevention.      Relevant Orders   Comprehensive metabolic panel   Lipid Panel w/o Chol/HDL Ratio   Hypertension associated with diabetes ([place])    Chronic, stable with BP at goal in office and on home readings.  Discussed with her goal BP <130/80.  Continue Olmesartan at this time and adjust dose as needed, is tolerating well without ADR -- offers kidney protection with her diabetes.  Recommend she continue to monitor BP at home daily and document for provider + focus on DASH diet.  LABS: CMP today.      Relevant Orders   Bayer DCA Hb A1c Waived   Comprehensive metabolic panel     Endocrine   Hyperlipidemia associated with type 2 diabetes mellitus ([facility])    Chronic, ongoing.  Continue current medication regimen and adjust as needed.  Lipid panel today.      Relevant Orders   Bayer DCA Hb A1c Waived   Comprehensive metabolic panel   Lipid Panel w/o Chol/HDL Ratio   Type 2 diabetes mellitus with morbid obesity ([facility])    Chronic, ongoing with A1c upward trend to 9.3%  August 2023, this is highest PCP has seen for her and suspect related to diet over summer and weight gain -- she is working on changes.  She stopped Metformin due to rash.  Recheck A1c today. - Continue off **Note De-Identified Kuhner Obfuscation** Metformin due to side effect.  Could consider change to SGLT2  or Januvia if ongoing elevations.  She prefers to avoid injectables.  - Continue Olmesartan for kidney protection due to urine ALB 30 February 2023, some improvement from previous of 80 two year ago.   - Continue to monitor BS at home daily and document + focus on diet.   - Refuses vaccinations. - Eye and foot exam up to date. - Return in 3 months.      Relevant Orders   Bayer DCA Hb A1c Waived   Comprehensive metabolic panel      Other   Advanced care planning/counseling discussion    Discussed with patient today, she will bring forms in to scan into chart.      History of endometrial cancer    Stable, continue collaboration with GYN -- at this time she does not want any further internal exams performed and reports no symptoms present -- if symptoms present she agrees will notify GYN or PCP.      Obesity    BMI 30.76, some loss present.  Recommended eating smaller high protein, low fat meals more frequently and exercising 30 mins a day 5 times a week with a goal of 10-15lb weight loss in the next 3 months. Patient voiced their understanding and motivation to adhere to these recommendations.       Other Visit Diagnoses     Medicare annual wellness visit, subsequent    -  Primary   Medicare wellness due and performed with patient today.        Follow up plan: Return in about 3 months (around 11/10/2022) for T2DM, HTN/HLD.   LABORATORY TESTING:  - Pap smear: not applicable  IMMUNIZATIONS:   - Tdap: Tetanus vaccination status reviewed: last tetanus booster within 10 years. - Influenza: Refused - Pneumovax: Refused - Prevnar: Refused - COVID: Refused - HPV: Not applicable - Shingrix vaccine: Refused  SCREENING: -Mammogram: Up to date needs August 2024 - Colonoscopy: Refused  - Bone Density: Up to date  -Hearing Test: Not applicable  -Spirometry: Not applicable   PATIENT COUNSELING:   Advised to take 1 mg of folate supplement per day if capable of pregnancy.   Sexuality: Discussed sexually transmitted diseases, partner selection, use of condoms, avoidance of unintended pregnancy  and contraceptive alternatives.   Advised to avoid cigarette smoking.  I discussed with the patient that most people either abstain from alcohol or drink within safe limits (<=14/week and <=4 drinks/occasion for males, <=7/weeks and <= 3 drinks/occasion for females) and that the risk for alcohol disorders and other  health effects rises proportionally with the number of drinks per week and how often a drinker exceeds daily limits.  Discussed cessation/primary prevention of drug use and availability of treatment for abuse.   Diet: Encouraged to adjust caloric intake to maintain  or achieve ideal body weight, to reduce intake of dietary saturated fat and total fat, to limit sodium intake by avoiding high sodium foods and not adding table salt, and to maintain adequate dietary potassium and calcium preferably from fresh fruits, vegetables, and low-fat dairy products.    Stressed the importance of regular exercise  Injury prevention: Discussed safety belts, safety helmets, smoke detector, smoking near bedding or upholstery.   Dental health: Discussed importance of regular tooth brushing, flossing, and dental visits.    NEXT PREVENTATIVE PHYSICAL DUE IN 1 YEAR. Return in about 3 months (around 11/10/2022) for T2DM, HTN/HLD.

## 2022-08-11 NOTE — Progress Notes (Signed)
**Note De-identified Storlie Obfuscation** Error

## 2022-08-11 NOTE — Assessment & Plan Note (Signed)
**Note De-Identified Hark Obfuscation** Discussed with patient today, she will bring forms in to scan into chart.

## 2022-08-11 NOTE — Assessment & Plan Note (Signed)
**Note De-Identified Whiteaker Obfuscation** Chronic, stable with BP at goal in office and on home readings.  Discussed with her goal BP <130/80.  Continue Olmesartan at this time and adjust dose as needed, is tolerating well without ADR -- offers kidney protection with her diabetes.  Recommend she continue to monitor BP at home daily and document for provider + focus on DASH diet.  LABS: CMP today.

## 2022-08-11 NOTE — Addendum Note (Signed)
**Note De-identified Tisdel Obfuscation** Addended by: Arieona Swaggerty T on: 08/11/2022 03:14 PM   Modules accepted: Level of Service  

## 2022-08-11 NOTE — Assessment & Plan Note (Signed)
**Note De-Identified Cervantes Obfuscation** Stable, continue collaboration with GYN -- at this time she does not want any further internal exams performed and reports no symptoms present -- if symptoms present she agrees will notify GYN or PCP.

## 2022-08-11 NOTE — Progress Notes (Signed)
**Note De-Identified Stroebel Obfuscation** Attempted to call patient and left general HIPAA compliant message, will attempt to call again at later date.

## 2022-08-11 NOTE — Assessment & Plan Note (Signed)
**Note De-identified Quest Obfuscation** Chronic, ongoing.  Continue current medication regimen and adjust as needed. Lipid panel today. 

## 2022-08-11 NOTE — Assessment & Plan Note (Signed)
**Note De-Identified Brisky Obfuscation** BMI 30.76, some loss present.  Recommended eating smaller high protein, low fat meals more frequently and exercising 30 mins a day 5 times a week with a goal of 10-15lb weight loss in the next 3 months. Patient voiced their understanding and motivation to adhere to these recommendations.

## 2022-08-12 LAB — LIPID PANEL W/O CHOL/HDL RATIO
Cholesterol, Total: 148 mg/dL (ref 100–199)
HDL: 36 mg/dL — ABNORMAL LOW (ref >39)
LDL Chol Calc (NIH): 79 mg/dL (ref 0–99)
Triglycerides: 198 mg/dL — ABNORMAL HIGH (ref 0–149)
VLDL Cholesterol Cal: 33 mg/dL (ref 5–40)

## 2022-08-12 LAB — COMPREHENSIVE METABOLIC PANEL
ALT: 44 IU/L — ABNORMAL HIGH (ref 0–32)
AST: 30 IU/L (ref 0–40)
Albumin/Globulin Ratio: 1.7 (ref 1.2–2.2)
Albumin: 4.5 g/dL (ref 3.8–4.8)
Alkaline Phosphatase: 96 IU/L (ref 44–121)
BUN/Creatinine Ratio: 10 — ABNORMAL LOW (ref 12–28)
BUN: 7 mg/dL — ABNORMAL LOW (ref 8–27)
Bilirubin Total: 0.8 mg/dL (ref 0.0–1.2)
CO2: 25 mmol/L (ref 20–29)
Calcium: 9.8 mg/dL (ref 8.7–10.3)
Chloride: 100 mmol/L (ref 96–106)
Creatinine, Ser: 0.72 mg/dL (ref 0.57–1.00)
Globulin, Total: 2.6 g/dL (ref 1.5–4.5)
Glucose: 180 mg/dL — ABNORMAL HIGH (ref 70–99)
Potassium: 4.7 mmol/L (ref 3.5–5.2)
Sodium: 138 mmol/L (ref 134–144)
Total Protein: 7.1 g/dL (ref 6.0–8.5)
eGFR: 88 mL/min/{1.73_m2} (ref >59)

## 2022-08-16 ENCOUNTER — Telehealth: Payer: Self-pay | Admitting: Nurse Practitioner

## 2022-08-16 NOTE — Progress Notes (Signed)
**Note De-Identified Whitefield Obfuscation** Refer to telephone call 08/16/22

## 2022-08-16 NOTE — Telephone Encounter (Signed)
**Note De-Identified Turley Obfuscation** Spoke to patient Suzanne Jennings telephone and discussed her lab results with A1c still elevated at 8.9%, but trending down.  We have discussed trying alternate medication, as did not tolerate Metformin, but she refuses at this time.  Wishes to continue on current supplement she is taking and recheck in March.  Discussed with patient.  She stated appreciation for phone call.

## 2022-08-18 ENCOUNTER — Other Ambulatory Visit: Payer: Self-pay | Admitting: Nurse Practitioner

## 2022-08-21 NOTE — Telephone Encounter (Signed)
**Note De-Identified Bradby Obfuscation** Requested Prescriptions  Pending Prescriptions Disp Refills   atorvastatin (LIPITOR) 80 MG tablet [Pharmacy Med Name: Atorvastatin Calcium 80 MG Oral Tablet] 90 tablet 0    Sig: TAKE 1 TABLET BY MOUTH ONCE  DAILY     Cardiovascular:  Antilipid - Statins Failed - 08/18/2022 10:11 PM      Failed - Lipid Panel in normal range within the last 12 months    Cholesterol, Total  Date Value Ref Range Status  08/11/2022 148 100 - 199 mg/dL Final   Cholesterol Piccolo, Waived  Date Value Ref Range Status  06/29/2017 169 <200 mg/dL Final    Comment:                            Desirable                <200                         Borderline High      200- 239                         High                     >239    LDL Chol Calc (NIH)  Date Value Ref Range Status  08/11/2022 79 0 - 99 mg/dL Final   HDL  Date Value Ref Range Status  08/11/2022 36 (L) >39 mg/dL Final   Triglycerides  Date Value Ref Range Status  08/11/2022 198 (H) 0 - 149 mg/dL Final   Triglycerides Piccolo,Waived  Date Value Ref Range Status  06/29/2017 179 (H) <150 mg/dL Final    Comment:                            Normal                   <150                         Borderline High     150 - 199                         High                200 - 499                         Very High                >499          Passed - Patient is not pregnant      Passed - Valid encounter within last 12 months    Recent Outpatient Visits           1 week ago Medicare annual wellness visit, subsequent   [employer], [place] T, NP   3 months ago Type 2 diabetes mellitus with morbid obesity ([place])   Platter, Jolene T, NP   4 months ago Type 2 diabetes mellitus with morbid obesity ([place])   [place], Jolene T, NP   10 months ago Type 2 diabetes mellitus with morbid obesity ([place])   [place], Barbaraann Faster, NP **Note De-Identified Minich Obfuscation** 1 year ago Type 2 diabetes  mellitus with morbid obesity ([place])   [place], Barbaraann Faster, NP       Future Appointments             In 2 months Cannady, Barbaraann Faster, NP [facility], [facility]

## 2022-09-14 DIAGNOSIS — H35032 Hypertensive retinopathy, left eye: Secondary | ICD-10-CM | POA: Diagnosis not present

## 2022-09-14 DIAGNOSIS — H2513 Age-related nuclear cataract, bilateral: Secondary | ICD-10-CM | POA: Diagnosis not present

## 2022-09-14 DIAGNOSIS — E119 Type 2 diabetes mellitus without complications: Secondary | ICD-10-CM | POA: Diagnosis not present

## 2022-09-14 LAB — HM DIABETES EYE EXAM

## 2022-09-19 ENCOUNTER — Encounter: Payer: Self-pay | Admitting: Nurse Practitioner

## 2022-10-22 ENCOUNTER — Other Ambulatory Visit: Payer: Self-pay | Admitting: Nurse Practitioner

## 2022-10-23 NOTE — Telephone Encounter (Signed)
**Note De-Identified Janak Obfuscation** Requested Prescriptions  Pending Prescriptions Disp Refills   atorvastatin (LIPITOR) 80 MG tablet [Pharmacy Med Name: Atorvastatin Calcium 80 MG Oral Tablet] 100 tablet     Sig: TAKE 1 TABLET BY MOUTH ONCE  DAILY     Cardiovascular:  Antilipid - Statins Failed - 10/22/2022 10:32 PM      Failed - Lipid Panel in normal range within the last 12 months    Cholesterol, Total  Date Value Ref Range Status  08/11/2022 148 100 - 199 mg/dL Final   Cholesterol Piccolo, Waived  Date Value Ref Range Status  06/29/2017 169 <200 mg/dL Final    Comment:                            Desirable                <200                         Borderline High      200- 239                         High                     >239    LDL Chol Calc (NIH)  Date Value Ref Range Status  08/11/2022 79 0 - 99 mg/dL Final   HDL  Date Value Ref Range Status  08/11/2022 36 (L) >39 mg/dL Final   Triglycerides  Date Value Ref Range Status  08/11/2022 198 (H) 0 - 149 mg/dL Final   Triglycerides Piccolo,Waived  Date Value Ref Range Status  06/29/2017 179 (H) <150 mg/dL Final    Comment:                            Normal                   <150                         Borderline High     150 - 199                         High                200 - 499                         Very High                >499          Passed - Patient is not pregnant      Passed - Valid encounter within last 12 months    Recent Outpatient Visits           2 months ago Medicare annual wellness visit, subsequent   [place], [place] T, NP   5 months ago 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

## 2022-11-04 NOTE — Patient Instructions (Signed)
**Note De-identified Xie Obfuscation** Diabetes Mellitus Basics  Diabetes mellitus, or diabetes, is a long-term (chronic) disease. It occurs when the body does not properly use sugar (glucose) that is released from food after you eat. Diabetes mellitus may be caused by one or both of these problems: Your pancreas does not make enough of a hormone called insulin. Your body does not react in a normal way to the insulin that it makes. Insulin lets glucose enter cells in your body. This gives you energy. If you have diabetes, glucose cannot get into cells. This causes high blood glucose (hyperglycemia). How to treat and manage diabetes You may need to take insulin or other diabetes medicines daily to keep your glucose in balance. If you are prescribed insulin, you will learn how to give yourself insulin by injection. You may need to adjust the amount of insulin you take based on the foods that you eat. You will need to check your blood glucose levels using a glucose monitor as told by your health care provider. The readings can help determine if you have low or high blood glucose. Generally, you should have these blood glucose levels: Before meals (preprandial): 80-130 mg/dL (4.4-7.2 mmol/L). After meals (postprandial): below 180 mg/dL (10 mmol/L). Hemoglobin A1c (HbA1c) level: less than 7%. Your health care provider will set treatment goals for you. Keep all follow-up visits. This is important. Follow these instructions at home: Diabetes medicines Take your diabetes medicines every day as told by your health care provider. List your diabetes medicines here: Name of medicine: ______________________________ Amount (dose): _______________ Time (a.m./p.m.): _______________ Notes: ___________________________________ Name of medicine: ______________________________ Amount (dose): _______________ Time (a.m./p.m.): _______________ Notes: ___________________________________ Name of medicine: ______________________________ Amount (dose):  _______________ Time (a.m./p.m.): _______________ Notes: ___________________________________ Insulin If you use insulin, list the types of insulin you use here: Insulin type: ______________________________ Amount (dose): _______________ Time (a.m./p.m.): _______________Notes: ___________________________________ Insulin type: ______________________________ Amount (dose): _______________ Time (a.m./p.m.): _______________ Notes: ___________________________________ Insulin type: ______________________________ Amount (dose): _______________ Time (a.m./p.m.): _______________ Notes: ___________________________________ Insulin type: ______________________________ Amount (dose): _______________ Time (a.m./p.m.): _______________ Notes: ___________________________________ Insulin type: ______________________________ Amount (dose): _______________ Time (a.m./p.m.): _______________ Notes: ___________________________________ Managing blood glucose  Check your blood glucose levels using a glucose monitor as told by your health care provider. Write down the times that you check your glucose levels here: Time: _______________ Notes: ___________________________________ Time: _______________ Notes: ___________________________________ Time: _______________ Notes: ___________________________________ Time: _______________ Notes: ___________________________________ Time: _______________ Notes: ___________________________________ Time: _______________ Notes: ___________________________________  Low blood glucose Low blood glucose (hypoglycemia) is when glucose is at or below 70 mg/dL (3.9 mmol/L). Symptoms may include: Feeling: Hungry. Sweaty and clammy. Irritable or easily upset. Dizzy. Sleepy. Having: A fast heartbeat. A headache. A change in your vision. Numbness around the mouth, lips, or tongue. Having trouble with: Moving (coordination). Sleeping. Treating low blood glucose To treat low blood  glucose, eat or drink something containing sugar right away. If you can think clearly and swallow safely, follow the 15:15 rule: Take 15 grams of a fast-acting carb (carbohydrate), as told by your health care provider. Some fast-acting carbs are: Glucose tablets: take 3-4 tablets. Hard candy: eat 3-5 pieces. Fruit juice: drink 4 oz (120 mL). Regular (not diet) soda: drink 4-6 oz (120-180 mL). Honey or sugar: eat 1 Tbsp (15 mL). Check your blood glucose levels 15 minutes after you take the carb. If your glucose is still at or below 70 mg/dL (3.9 mmol/L), take 15 grams of a carb again. If your glucose does not go above 70 mg/dL (3.9 mmol/L) after  **Note De-identified Crossett Obfuscation** 3 tries, get help right away. After your glucose goes back to normal, eat a meal or a snack within 1 hour. Treating very low blood glucose If your glucose is at or below 54 mg/dL (3 mmol/L), you have very low blood glucose (severe hypoglycemia). This is an emergency. Do not wait to see if the symptoms will go away. Get medical help right away. Call your local emergency services (911 in the [place] Do not drive yourself to the hospital. Questions to ask your health care provider Should I talk with a diabetes educator? What equipment will I need to care for myself at home? What diabetes medicines do I need? When should I take them? How often do I need to check my blood glucose levels? What number can I call if I have questions? When is my follow-up visit? Where can I find a support group for people with diabetes? Where to find more information American Diabetes Association: www.diabetes.org Association of Diabetes Care and Education Specialists: www.diabeteseducator.org Contact a health care provider if: Your blood glucose is at or above 240 mg/dL (13.3 mmol/L) for 2 days in a row. You have been sick or have had a fever for 2 days or more, and you are not getting better. You have any of these problems for more than 6 hours: You cannot eat or  drink. You feel nauseous. You vomit. You have diarrhea. Get help right away if: Your blood glucose is lower than 54 mg/dL (3 mmol/L). You get confused. You have trouble thinking clearly. You have trouble breathing. These symptoms may represent a serious problem that is an emergency. Do not wait to see if the symptoms will go away. Get medical help right away. Call your local emergency services (911 in the [place] Do not drive yourself to the hospital. Summary Diabetes mellitus is a chronic disease that occurs when the body does not properly use sugar (glucose) that is released from food after you eat. Take insulin and diabetes medicines as told. Check your blood glucose every day, as often as told. Keep all follow-up visits. This is important. This information is not intended to replace advice given to you by your health care provider. Make sure you discuss any questions you have with your health care provider. Document Revised: 12/02/2019 Document Reviewed: 12/02/2019 Elsevier Patient Education  2023 Elsevier Inc.  

## 2022-11-10 ENCOUNTER — Encounter: Payer: Self-pay | Admitting: Nurse Practitioner

## 2022-11-10 ENCOUNTER — Ambulatory Visit (INDEPENDENT_AMBULATORY_CARE_PROVIDER_SITE_OTHER): Payer: Medicare Other | Admitting: Nurse Practitioner

## 2022-11-10 DIAGNOSIS — Z6831 Body mass index (BMI) 31.0-31.9, adult: Secondary | ICD-10-CM

## 2022-11-10 DIAGNOSIS — I152 Hypertension secondary to endocrine disorders: Secondary | ICD-10-CM

## 2022-11-10 DIAGNOSIS — E785 Hyperlipidemia, unspecified: Secondary | ICD-10-CM | POA: Diagnosis not present

## 2022-11-10 DIAGNOSIS — E1159 Type 2 diabetes mellitus with other circulatory complications: Secondary | ICD-10-CM | POA: Diagnosis not present

## 2022-11-10 DIAGNOSIS — I700 Atherosclerosis of aorta: Secondary | ICD-10-CM | POA: Diagnosis not present

## 2022-11-10 DIAGNOSIS — E1169 Type 2 diabetes mellitus with other specified complication: Secondary | ICD-10-CM | POA: Diagnosis not present

## 2022-11-10 DIAGNOSIS — Z8542 Personal history of malignant neoplasm of other parts of uterus: Secondary | ICD-10-CM

## 2022-11-10 DIAGNOSIS — E6609 Other obesity due to excess calories: Secondary | ICD-10-CM | POA: Diagnosis not present

## 2022-11-10 LAB — BAYER DCA HB A1C WAIVED: HB A1C (BAYER DCA - WAIVED): 7.3 % — ABNORMAL HIGH (ref 4.8–5.6)

## 2022-11-10 LAB — MICROALBUMIN, URINE WAIVED
Creatinine, Urine Waived: 10 mg/dL (ref 10–300)
Microalb, Ur Waived: 10 mg/L (ref 0–19)
Microalb/Creat Ratio: <30 mg/g (ref <30)

## 2022-11-10 NOTE — Assessment & Plan Note (Signed)
**Note De-Identified Ojala Obfuscation** BMI 30.03, some loss present.  Recommended eating smaller high protein, low fat meals more frequently and exercising 30 mins a day 5 times a week with a goal of 10-15lb weight loss in the next 3 months. Patient voiced their understanding and motivation to adhere to these recommendations.

## 2022-11-10 NOTE — Progress Notes (Signed)
**Note De-Identified Vanleeuwen Obfuscation** BP 136/80 (BP Location: Left Arm, Patient Position: Sitting)   Pulse 65   Temp 97.6 F (36.4 C) (Oral)   Ht 5' 0.51" (1.537 m)   Wt 156 lb 6.4 oz (70.9 kg)   LMP  (LMP Unknown)   SpO2 98%   BMI 30.03 kg/m    Subjective:    Patient ID: Suzanne Jennings, female    DOB: Aug 18, 1950, 72 y.o.   MRN: [id number]  HPI: Suzanne Jennings is a 72 y.o. female  Chief Complaint  Patient presents with   Diabetes   Hypertension   DIABETES Last A1c 8.9% December.  Took Metformin in past but had allergic reaction to this.  She has been focused on diet and activity and did not want to start medication in December.  She is walking every morning and night.   Hypoglycemic episodes:no Polydipsia/polyuria: no Visual disturbance: no Chest pain: no Paresthesias: no Glucose Monitoring: yes             Accucheck frequency: three times a day             Fasting glucose: 154 this morning and 130 before coming to office             Post prandial:             Evening:             Before meals: Taking Insulin?: no             Long acting insulin:             Short acting insulin: Blood Pressure Monitoring: daily Retinal Examination: Up to Date  Foot Exam: Up to Date Pneumovax: refused Influenza: refused Aspirin: yes    HYPERTENSION / HYPERLIPIDEMIA Continues on Atorvastatin, Olmesartan, + ASA.  CT scan 2019 noted aortic atherosclerosis. Satisfied with current treatment? yes Duration of hypertension: chronic BP monitoring frequency: daily BP range: 127/69 recently BP medication side effects: no Duration of hyperlipidemia: chronic Cholesterol medication side effects: no Cholesterol supplements: none Medication compliance: good compliance Aspirin: yes Recent stressors: no Recurrent headaches: no Visual changes: no Palpitations: no Dyspnea: no Chest pain: no Lower extremity edema: no Dizzy/lightheaded: no    Relevant past medical, surgical, family and social history reviewed and updated as  indicated. Interim medical history since our last visit reviewed. Allergies and medications reviewed and updated.  Review of Systems  Constitutional:  Negative for activity change, appetite change, diaphoresis, fatigue and fever.  Respiratory:  Negative for cough, chest tightness and shortness of breath.   Cardiovascular:  Negative for chest pain, palpitations and leg swelling.  Gastrointestinal: Negative.   Endocrine: Negative for cold intolerance, heat intolerance, polydipsia, polyphagia and polyuria.  Neurological: Negative.   Psychiatric/Behavioral: Negative.      Per HPI unless specifically indicated above     Objective:    BP 136/80 (BP Location: Left Arm, Patient Position: Sitting)   Pulse 65   Temp 97.6 F (36.4 C) (Oral)   Ht 5' 0.51" (1.537 m)   Wt 156 lb 6.4 oz (70.9 kg)   LMP  (LMP Unknown)   SpO2 98%   BMI 30.03 kg/m   Wt Readings from Last 3 Encounters:  11/10/22 156 lb 6.4 oz (70.9 kg)  08/11/22 160 lb 3.2 oz (72.7 kg)  05/18/22 163 lb 12.8 oz (74.3 kg)    Physical Exam Vitals and nursing note reviewed.  Constitutional:      General: She is awake. She is not **Note De-Identified Mccallum Obfuscation** in acute distress.    Appearance: She is well-developed and well-groomed. She is obese. She is not ill-appearing.  HENT:     Head: Normocephalic.     Right Ear: Hearing normal.     Left Ear: Hearing normal.  Eyes:     General: Lids are normal.        Right eye: No discharge.        Left eye: No discharge.     Conjunctiva/sclera: Conjunctivae normal.     Pupils: Pupils are equal, round, and reactive to light.  Neck:     Thyroid: No thyromegaly.     Vascular: No carotid bruit.  Cardiovascular:     Rate and Rhythm: Normal rate and regular rhythm.     Heart sounds: Normal heart sounds. No murmur heard.    No gallop.  Pulmonary:     Effort: Pulmonary effort is normal. No accessory muscle usage or respiratory distress.     Breath sounds: Normal breath sounds.  Abdominal:     General: Bowel  sounds are normal. There is no distension.     Palpations: Abdomen is soft.     Tenderness: There is no abdominal tenderness. There is no right CVA tenderness or left CVA tenderness.  Musculoskeletal:     Cervical back: Normal range of motion and neck supple.     Right lower leg: No edema.     Left lower leg: No edema.  Skin:    General: Skin is warm and dry.  Neurological:     Mental Status: She is alert and oriented to person, place, and time.  Psychiatric:        Attention and Perception: Attention normal.        Mood and Affect: Mood normal.        Speech: Speech normal.        Behavior: Behavior normal. Behavior is cooperative.        Thought Content: Thought content normal.    Results for orders placed or performed in visit on 09/19/22  HM DIABETES EYE EXAM  Result Value Ref Range   HM Diabetic Eye Exam No Retinopathy No Retinopathy      Assessment & Plan:   Problem List Items Addressed This Visit       Cardiovascular and Mediastinum   Aortic atherosclerosis ([place])    Ongoing and stable.  Noted on CT 06/03/18.  Continue daily statin and ASA for prevention.      Hypertension associated with diabetes (HCC)    Chronic, stable.  BP at goal for age.  Discussed with her goal BP <130/80.  Continue Olmesartan at this time and adjust dose as needed, is tolerating well without ADR -- offers kidney protection with her diabetes.  Recommend she continue to monitor BP at home daily and document for provider + focus on DASH diet.  LABS: CMP, TSH, urine ALB today.  Urine ALB 22 October 2022.      Relevant Orders   Bayer DCA Hb A1c Waived   Microalbumin, Urine Waived   Comprehensive metabolic panel   TSH     Endocrine   Hyperlipidemia associated with type 2 diabetes mellitus (HCC)    Chronic, ongoing.  Continue current medication regimen and adjust as needed.  Lipid panel today.      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  Metformin caused rash.  Praised for major success and weight loss. - Continue off Metformin due to side effect.  Could consider change to SGLT2  or Januvia if ongoing elevations in future.  She prefers to avoid injectables. Discussed with her goal is A1c <7%. - Continue Olmesartan for kidney protection with urine ALB 22 October 2022, some improvement from previous of 30 and 80 in past.  - Continue to monitor BS at home daily and document + focus on diet.   - Refuses vaccinations. - Eye and foot exam up to date. - Return in 3 months.      Relevant Orders   Bayer DCA Hb A1c Waived   Microalbumin, Urine Waived     Other   Obesity    BMI 30.03, some loss present.  Recommended eating smaller high protein, low fat meals more frequently and exercising 30 mins a day 5 times a week with a goal of 10-15lb weight loss in the next 3 months. Patient voiced their understanding and motivation to adhere to these recommendations.         Follow up plan: Return in about 3 months (around 02/10/2023) for T2DM, HTN/HLD.

## 2022-11-10 NOTE — Assessment & Plan Note (Signed)
**Note De-Identified Lanza Obfuscation** Chronic, stable.  BP at goal for age.  Discussed with her goal BP <130/80.  Continue Olmesartan at this time and adjust dose as needed, is tolerating well without ADR -- offers kidney protection with her diabetes.  Recommend she continue to monitor BP at home daily and document for provider + focus on DASH diet.  LABS: CMP, TSH, urine ALB today.  Urine ALB 22 October 2022.

## 2022-11-10 NOTE — Assessment & Plan Note (Signed)
**Note De-identified Spagnuolo Obfuscation** Ongoing and stable.  Noted on CT 06/03/18.  Continue daily statin and ASA for prevention. 

## 2022-11-10 NOTE — Assessment & Plan Note (Signed)
**Note De-identified Menard Obfuscation** Chronic, ongoing.  Continue current medication regimen and adjust as needed. Lipid panel today. 

## 2022-11-10 NOTE — Assessment & Plan Note (Addendum)
**Note De-Identified Gallen Obfuscation** Chronic, ongoing with A1c upward down to 7.3% from 8.9% in December with heavy focus on diet and exercise.  Metformin caused rash.  Praised for major success and weight loss. - Continue off Metformin due to side effect.  Could consider change to SGLT2  or Januvia if ongoing elevations in future.  She prefers to avoid injectables. Discussed with her goal is A1c <7%. - Continue Olmesartan for kidney protection with urine ALB 22 October 2022, some improvement from previous of 30 and 80 in past.  - Continue to monitor BS at home daily and document + focus on diet.   - Refuses vaccinations. - Eye and foot exam up to date. - Return in 3 months.

## 2022-11-11 LAB — LIPID PANEL W/O CHOL/HDL RATIO
Cholesterol, Total: 142 mg/dL (ref 100–199)
HDL: 37 mg/dL — ABNORMAL LOW (ref >39)
LDL Chol Calc (NIH): 75 mg/dL (ref 0–99)
Triglycerides: 175 mg/dL — ABNORMAL HIGH (ref 0–149)
VLDL Cholesterol Cal: 30 mg/dL (ref 5–40)

## 2022-11-11 LAB — COMPREHENSIVE METABOLIC PANEL
ALT: 23 IU/L (ref 0–32)
AST: 19 IU/L (ref 0–40)
Albumin/Globulin Ratio: 1.7 (ref 1.2–2.2)
Albumin: 4.3 g/dL (ref 3.8–4.8)
Alkaline Phosphatase: 103 IU/L (ref 44–121)
BUN/Creatinine Ratio: 14 (ref 12–28)
BUN: 11 mg/dL (ref 8–27)
Bilirubin Total: 0.7 mg/dL (ref 0.0–1.2)
CO2: 24 mmol/L (ref 20–29)
Calcium: 9.5 mg/dL (ref 8.7–10.3)
Chloride: 103 mmol/L (ref 96–106)
Creatinine, Ser: 0.78 mg/dL (ref 0.57–1.00)
Globulin, Total: 2.6 g/dL (ref 1.5–4.5)
Glucose: 127 mg/dL — ABNORMAL HIGH (ref 70–99)
Potassium: 4.8 mmol/L (ref 3.5–5.2)
Sodium: 141 mmol/L (ref 134–144)
Total Protein: 6.9 g/dL (ref 6.0–8.5)
eGFR: 80 mL/min/{1.73_m2} (ref >59)

## 2022-11-11 LAB — TSH: TSH: 1.7 u[IU]/mL (ref 0.450–4.500)

## 2022-11-11 NOTE — Progress Notes (Signed)
**Note De-Identified Kushnir Obfuscation** Good morning, please let Earlee know her labs have returned and overall kidney and liver function are normal.  Cholesterol levels remain stable.  Any questions? Keep being stellar!!  Thank you for allowing me to participate in your care.  I appreciate you. Kindest regards, Janesia Joswick

## 2023-01-03 ENCOUNTER — Other Ambulatory Visit: Payer: Self-pay | Admitting: Nurse Practitioner

## 2023-01-04 NOTE — Telephone Encounter (Signed)
**Note De-Identified Warne Obfuscation** Requested Prescriptions  Pending Prescriptions Disp Refills   atorvastatin (LIPITOR) 80 MG tablet [Pharmacy Med Name: Atorvastatin Calcium 80 MG Oral Tablet] 100 tablet 2    Sig: TAKE 1 TABLET BY MOUTH ONCE  DAILY     Cardiovascular:  Antilipid - Statins Failed - 01/03/2023 10:50 PM      Failed - Lipid Panel in normal range within the last 12 months    Cholesterol, Total  Date Value Ref Range Status  11/10/2022 142 100 - 199 mg/dL Final   Cholesterol Piccolo, Waived  Date Value Ref Range Status  06/29/2017 169 <200 mg/dL Final    Comment:                            Desirable                <200                         Borderline High      200- 239                         High                     >239    LDL Chol Calc (NIH)  Date Value Ref Range Status  11/10/2022 75 0 - 99 mg/dL Final   HDL  Date Value Ref Range Status  11/10/2022 37 (L) >39 mg/dL Final   Triglycerides  Date Value Ref Range Status  11/10/2022 175 (H) 0 - 149 mg/dL Final   Triglycerides Piccolo,Waived  Date Value Ref Range Status  06/29/2017 179 (H) <150 mg/dL Final    Comment:                            Normal                   <150                         Borderline High     150 - 199                         High                200 - 499                         Very High                >499          Passed - Patient is not pregnant      Passed - Valid encounter within last 12 months    Recent Outpatient Visits           1 month ago Type 2 diabetes mellitus with morbid obesity (HCC)   [place] [facility] [place], [place] T, NP   4 months ago Medicare annual wellness visit, subsequent   [place] [facility] [place], [place] T, NP   7 months ago Type 2 diabetes mellitus with morbid obesity (HCC)   [facility] [place], [place] T, NP   9 months ago Type 2 diabetes mellitus with morbid obesity (HCC)   [facility] **Note De-Identified [place] Obfuscation** [facility]  [place], [place] T, NP   1 year ago Type 2 diabetes mellitus with morbid obesity (HCC)   [place] [facility] [place], Dorie Rank, NP       Future Appointments             In 1 month Cannady, Dorie Rank, NP [place] [facility], [facility]

## 2023-02-10 NOTE — Patient Instructions (Signed)
**Note De-identified [place] Obfuscation** Be Involved in Your Health Care:  Taking Medications When medications are taken as directed, they can greatly improve your health. But if they are not taken as instructed, they may not work. In some cases, not taking them correctly can be harmful. To help ensure your treatment remains effective and safe, understand your medications and how to take them.  Your lab results, notes and after visit summary will be available on My Chart. We strongly encourage you to use this feature. If lab results are abnormal the clinic will contact you with the appropriate steps. If the clinic does not contact you assume the results are satisfactory. You can always see your results on My Chart. If you have questions regarding your condition, please contact the clinic during office hours. You can also ask questions on My Chart.  We at [facility] are grateful that you chose us to provide care. We strive to provide excellent and compassionate care and are always looking for feedback. If you get a survey from the clinic please complete this.   Diabetes Mellitus Basics  Diabetes mellitus, or diabetes, is a long-term (chronic) disease. It occurs when the body does not properly use sugar (glucose) that is released from food after you eat. Diabetes mellitus may be caused by one or both of these problems: Your pancreas does not make enough of a hormone called insulin. Your body does not react in a normal way to the insulin that it makes. Insulin lets glucose enter cells in your body. This gives you energy. If you have diabetes, glucose cannot get into cells. This causes high blood glucose (hyperglycemia). How to treat and manage diabetes You may need to take insulin or other diabetes medicines daily to keep your glucose in balance. If you are prescribed insulin, you will learn how to give yourself insulin by injection. You may need to adjust the amount of insulin you take based on the foods that you eat. You will  need to check your blood glucose levels using a glucose monitor as told by your health care provider. The readings can help determine if you have low or high blood glucose. Generally, you should have these blood glucose levels: Before meals (preprandial): 80-130 mg/dL (4.4-7.2 mmol/L). After meals (postprandial): below 180 mg/dL (10 mmol/L). Hemoglobin A1c (HbA1c) level: less than 7%. Your health care provider will set treatment goals for you. Keep all follow-up visits. This is important. Follow these instructions at home: Diabetes medicines Take your diabetes medicines every day as told by your health care provider. List your diabetes medicines here: Name of medicine: ______________________________ Amount (dose): _______________ Time (a.m./p.m.): _______________ Notes: ___________________________________ Name of medicine: ______________________________ Amount (dose): _______________ Time (a.m./p.m.): _______________ Notes: ___________________________________ Name of medicine: ______________________________ Amount (dose): _______________ Time (a.m./p.m.): _______________ Notes: ___________________________________ Insulin If you use insulin, list the types of insulin you use here: Insulin type: ______________________________ Amount (dose): _______________ Time (a.m./p.m.): _______________Notes: ___________________________________ Insulin type: ______________________________ Amount (dose): _______________ Time (a.m./p.m.): _______________ Notes: ___________________________________ Insulin type: ______________________________ Amount (dose): _______________ Time (a.m./p.m.): _______________ Notes: ___________________________________ Insulin type: ______________________________ Amount (dose): _______________ Time (a.m./p.m.): _______________ Notes: ___________________________________ Insulin type: ______________________________ Amount (dose): _______________ Time (a.m./p.m.): _______________  Notes: ___________________________________ Managing blood glucose  Check your blood glucose levels using a glucose monitor as told by your health care provider. Write down the times that you check your glucose levels here: Time: _______________ Notes: ___________________________________ Time: _______________ Notes: ___________________________________ Time: _______________ Notes: ___________________________________ Time: _______________ Notes: ___________________________________ Time: _______________ Notes: ___________________________________ Time: _______________ Notes: ___________________________________   **Note De-identified [place] Obfuscation** Low blood glucose Low blood glucose (hypoglycemia) is when glucose is at or below 70 mg/dL (3.9 mmol/L). Symptoms may include: Feeling: Hungry. Sweaty and clammy. Irritable or easily upset. Dizzy. Sleepy. Having: A fast heartbeat. A headache. A change in your vision. Numbness around the mouth, lips, or tongue. Having trouble with: Moving (coordination). Sleeping. Treating low blood glucose To treat low blood glucose, eat or drink something containing sugar right away. If you can think clearly and swallow safely, follow the 15:15 rule: Take 15 grams of a fast-acting carb (carbohydrate), as told by your health care provider. Some fast-acting carbs are: Glucose tablets: take 3-4 tablets. Hard candy: eat 3-5 pieces. Fruit juice: drink 4 oz (120 mL). Regular (not diet) soda: drink 4-6 oz (120-180 mL). Honey or sugar: eat 1 Tbsp (15 mL). Check your blood glucose levels 15 minutes after you take the carb. If your glucose is still at or below 70 mg/dL (3.9 mmol/L), take 15 grams of a carb again. If your glucose does not go above 70 mg/dL (3.9 mmol/L) after 3 tries, get help right away. After your glucose goes back to normal, eat a meal or a snack within 1 hour. Treating very low blood glucose If your glucose is at or below 54 mg/dL (3 mmol/L), you have very low blood glucose  (severe hypoglycemia). This is an emergency. Do not wait to see if the symptoms will go away. Get medical help right away. Call your local emergency services (911 in the [place] Do not drive yourself to the hospital. Questions to ask your health care provider Should I talk with a diabetes educator? What equipment will I need to care for myself at home? What diabetes medicines do I need? When should I take them? How often do I need to check my blood glucose levels? What number can I call if I have questions? When is my follow-up visit? Where can I find a support group for people with diabetes? Where to find more information American Diabetes Association: www.diabetes.org Association of Diabetes Care and Education Specialists: www.diabeteseducator.org Contact a health care provider if: Your blood glucose is at or above 240 mg/dL (13.3 mmol/L) for 2 days in a row. You have been sick or have had a fever for 2 days or more, and you are not getting better. You have any of these problems for more than 6 hours: You cannot eat or drink. You feel nauseous. You vomit. You have diarrhea. Get help right away if: Your blood glucose is lower than 54 mg/dL (3 mmol/L). You get confused. You have trouble thinking clearly. You have trouble breathing. These symptoms may represent a serious problem that is an emergency. Do not wait to see if the symptoms will go away. Get medical help right away. Call your local emergency services (911 in the [place] Do not drive yourself to the hospital. Summary Diabetes mellitus is a chronic disease that occurs when the body does not properly use sugar (glucose) that is released from food after you eat. Take insulin and diabetes medicines as told. Check your blood glucose every day, as often as told. Keep all follow-up visits. This is important. This information is not intended to replace advice given to you by your health care provider. Make sure you discuss any  questions you have with your health care provider. Document Revised: 12/02/2019 Document Reviewed: 12/02/2019 Elsevier Patient Education  2024 Elsevier Inc.  

## 2023-02-13 ENCOUNTER — Encounter: Payer: Self-pay | Admitting: Nurse Practitioner

## 2023-02-13 ENCOUNTER — Ambulatory Visit (INDEPENDENT_AMBULATORY_CARE_PROVIDER_SITE_OTHER): Payer: Medicare Other | Admitting: Nurse Practitioner

## 2023-02-13 DIAGNOSIS — I152 Hypertension secondary to endocrine disorders: Secondary | ICD-10-CM

## 2023-02-13 DIAGNOSIS — E1159 Type 2 diabetes mellitus with other circulatory complications: Secondary | ICD-10-CM

## 2023-02-13 DIAGNOSIS — E1169 Type 2 diabetes mellitus with other specified complication: Secondary | ICD-10-CM

## 2023-02-13 DIAGNOSIS — I700 Atherosclerosis of aorta: Secondary | ICD-10-CM | POA: Diagnosis not present

## 2023-02-13 DIAGNOSIS — E785 Hyperlipidemia, unspecified: Secondary | ICD-10-CM | POA: Diagnosis not present

## 2023-02-13 DIAGNOSIS — Z6831 Body mass index (BMI) 31.0-31.9, adult: Secondary | ICD-10-CM

## 2023-02-13 DIAGNOSIS — E6609 Other obesity due to excess calories: Secondary | ICD-10-CM | POA: Diagnosis not present

## 2023-02-13 LAB — BAYER DCA HB A1C WAIVED: HB A1C (BAYER DCA - WAIVED): 7.4 % — ABNORMAL HIGH (ref 4.8–5.6)

## 2023-02-13 NOTE — Assessment & Plan Note (Signed)
**Note De-identified [place] Obfuscation** Ongoing and stable.  Noted on CT 06/03/18.  Continue daily statin and ASA for prevention. 

## 2023-02-13 NOTE — Assessment & Plan Note (Signed)
**Note De-Identified [place] Obfuscation** BMI 29.76, some loss present.  Recommended eating smaller high protein, low fat meals more frequently and exercising 30 mins a day 5 times a week with a goal of 10-15lb weight loss in the next 3 months. Patient voiced their understanding and motivation to adhere to these recommendations.

## 2023-02-13 NOTE — Progress Notes (Signed)
**Note De-Identified [place] Obfuscation** BP 127/76   Pulse 70   Temp 97.9 F (36.6 C) (Oral)   Ht 5' 0.51" (1.537 m)   Wt 155 lb (70.3 kg)   LMP  (LMP Unknown)   SpO2 96%   BMI 29.76 kg/m    Subjective:    Patient ID: Suzanne Jennings, female    DOB: 1951-08-03, 72 y.o.   MRN: [id number]  HPI: Suzanne Jennings is a 72 y.o. female  Chief Complaint  Patient presents with   Diabetes   Hypertension   Hyperlipidemia   DIABETES Last A1c trending down to 7.3% from high 8's.  Took Metformin in past but had allergic reaction to this.  She has been focused on diet and activity, continues to not want to start medication.  She is going for walks three times a day -- 13000 steps and burned 600 calories. Hypoglycemic episodes:no Polydipsia/polyuria: no Visual disturbance: no Chest pain: no Paresthesias: no Glucose Monitoring: yes             Accucheck frequency: three times a day             Fasting glucose: 119 to 120 range             Post prandial:             Evening:             Before meals: Taking Insulin?: no             Long acting insulin:             Short acting insulin: Blood Pressure Monitoring: daily Retinal Examination: Up to Date  Foot Exam: Up to Date Pneumovax: refused Influenza: refused Aspirin: yes    HYPERTENSION / HYPERLIPIDEMIA Continues on Atorvastatin, Olmesartan, + ASA.  CT scan 2019 noted aortic atherosclerosis. Satisfied with current treatment? yes Duration of hypertension: chronic BP monitoring frequency: daily BP range: 120-130/70 range at home BP medication side effects: no Duration of hyperlipidemia: chronic Cholesterol medication side effects: no Cholesterol supplements: none Medication compliance: good compliance Aspirin: yes Recent stressors: no Recurrent headaches: no Visual changes: no Palpitations: no Dyspnea: no Chest pain: no Lower extremity edema: no Dizzy/lightheaded: no    Relevant past medical, surgical, family and social history reviewed and updated as indicated.  Interim medical history since our last visit reviewed. Allergies and medications reviewed and updated.  Review of Systems  Constitutional:  Negative for activity change, appetite change, diaphoresis, fatigue and fever.  Respiratory:  Negative for cough, chest tightness and shortness of breath.   Cardiovascular:  Negative for chest pain, palpitations and leg swelling.  Gastrointestinal: Negative.   Endocrine: Negative for cold intolerance, heat intolerance, polydipsia, polyphagia and polyuria.  Neurological: Negative.   Psychiatric/Behavioral: Negative.     Per HPI unless specifically indicated above     Objective:    BP 127/76   Pulse 70   Temp 97.9 F (36.6 C) (Oral)   Ht 5' 0.51" (1.537 m)   Wt 155 lb (70.3 kg)   LMP  (LMP Unknown)   SpO2 96%   BMI 29.76 kg/m   Wt Readings from Last 3 Encounters:  02/13/23 155 lb (70.3 kg)  11/10/22 156 lb 6.4 oz (70.9 kg)  08/11/22 160 lb 3.2 oz (72.7 kg)    Physical Exam Vitals and nursing note reviewed.  Constitutional:      General: She is awake. She is not in acute distress.    Appearance: She is well-developed and **Note De-Identified [place] Obfuscation** well-groomed. She is obese. She is not ill-appearing.  HENT:     Head: Normocephalic.     Right Ear: Hearing normal.     Left Ear: Hearing normal.  Eyes:     General: Lids are normal.        Right eye: No discharge.        Left eye: No discharge.     Conjunctiva/sclera: Conjunctivae normal.     Pupils: Pupils are equal, round, and reactive to light.  Neck:     Thyroid: No thyromegaly.     Vascular: No carotid bruit.  Cardiovascular:     Rate and Rhythm: Normal rate and regular rhythm.     Heart sounds: Normal heart sounds. No murmur heard.    No gallop.  Pulmonary:     Effort: Pulmonary effort is normal. No accessory muscle usage or respiratory distress.     Breath sounds: Normal breath sounds.  Abdominal:     General: Bowel sounds are normal. There is no distension.     Palpations: Abdomen is soft.      Tenderness: There is no abdominal tenderness. There is no right CVA tenderness or left CVA tenderness.  Musculoskeletal:     Cervical back: Normal range of motion and neck supple.     Right lower leg: No edema.     Left lower leg: No edema.  Skin:    General: Skin is warm and dry.  Neurological:     Mental Status: She is alert and oriented to person, place, and time.  Psychiatric:        Attention and Perception: Attention normal.        Mood and Affect: Mood normal.        Speech: Speech normal.        Behavior: Behavior normal. Behavior is cooperative.        Thought Content: Thought content normal.    Results for orders placed or performed in visit on 11/10/22  Bayer DCA Hb A1c Waived  Result Value Ref Range   HB A1C (BAYER DCA - WAIVED) 7.3 (H) 4.8 - 5.6 %  Microalbumin, Urine Waived  Result Value Ref Range   Microalb, Ur Waived 10 0 - 19 mg/L   Creatinine, Urine Waived 10 10 - 300 mg/dL   Microalb/Creat Ratio <30 <30 mg/g  Comprehensive metabolic panel  Result Value Ref Range   Glucose 127 (H) 70 - 99 mg/dL   BUN 11 8 - 27 mg/dL   Creatinine, Ser 1.61 0.57 - 1.00 mg/dL   eGFR 80 >09 UE/AVW/0.98   BUN/Creatinine Ratio 14 12 - 28   Sodium 141 134 - 144 mmol/L   Potassium 4.8 3.5 - 5.2 mmol/L   Chloride 103 96 - 106 mmol/L   CO2 24 20 - 29 mmol/L   Calcium 9.5 8.7 - 10.3 mg/dL   Total Protein 6.9 6.0 - 8.5 g/dL   Albumin 4.3 3.8 - 4.8 g/dL   Globulin, Total 2.6 1.5 - 4.5 g/dL   Albumin/Globulin Ratio 1.7 1.2 - 2.2   Bilirubin Total 0.7 0.0 - 1.2 mg/dL   Alkaline Phosphatase 103 44 - 121 IU/L   AST 19 0 - 40 IU/L   ALT 23 0 - 32 IU/L  Lipid Panel w/o Chol/HDL Ratio  Result Value Ref Range   Cholesterol, Total 142 100 - 199 mg/dL   Triglycerides 119 (H) 0 - 149 mg/dL   HDL 37 (L) >14 mg/dL   VLDL Cholesterol Cal 30 5 - **Note De-Identified [place] Obfuscation** 40 mg/dL   LDL Chol Calc (NIH) 75 0 - 99 mg/dL  TSH  Result Value Ref Range   TSH 1.700 0.450 - 4.500 uIU/mL      Assessment & Plan:    Problem List Items Addressed This Visit       Cardiovascular and Mediastinum   Aortic atherosclerosis (HCC)    Ongoing and stable.  Noted on CT 06/03/18.  Continue daily statin and ASA for prevention.      Relevant Orders   Comprehensive metabolic panel   Lipid Panel w/o Chol/HDL Ratio   Hypertension associated with diabetes (HCC)    Chronic, stable.  BP at goal for age.  Discussed with her goal BP <130/80.  Continue Olmesartan at this time and adjust dose as needed, is tolerating well without ADR -- offers kidney protection with her diabetes.  Recommend she continue to monitor BP at home daily and document for provider + focus on DASH diet.  LABS: CMP.  Urine ALB 22 October 2022.      Relevant Orders   Bayer DCA Hb A1c Waived   Comprehensive metabolic panel     Endocrine   Hyperlipidemia associated with type 2 diabetes mellitus (HCC)    Chronic, ongoing.  Continue current medication regimen and adjust as needed.  Lipid panel today.      Relevant Orders   Bayer DCA Hb A1c Waived   Comprehensive metabolic panel   Lipid Panel w/o Chol/HDL Ratio   Type 2 diabetes mellitus with morbid obesity (HCC) - Primary    Chronic, ongoing with A1c 7.4% today with heavy focus on diet and exercise.  Metformin caused rash.  Praised for major success and weight loss. - Continue off Metformin due to side effect.  Could consider change to SGLT2 or Januvia if ongoing elevations in future.  She prefers to avoid injectables. Discussed with her goal is A1c <8% per the American Geriatric Society guidelines. - Continue Olmesartan for kidney protection with urine ALB 22 October 2022, some improvement from previous of 30 and 80 in past.  - Continue to monitor BS at home daily and document + focus on diet.   - Refuses vaccinations. - Eye and foot exam up to date. - Return in 3 months.      Relevant Orders   Bayer DCA Hb A1c Waived     Other   Obesity    BMI 29.76, some loss present.  Recommended eating  smaller high protein, low fat meals more frequently and exercising 30 mins a day 5 times a week with a goal of 10-15lb weight loss in the next 3 months. Patient voiced their understanding and motivation to adhere to these recommendations.         Follow up plan: Return in about 3 months (around 05/16/2023) for T2DM, HTN/HLD.

## 2023-02-13 NOTE — Assessment & Plan Note (Signed)
**Note De-identified [place] Obfuscation** Chronic, ongoing.  Continue current medication regimen and adjust as needed. Lipid panel today. 

## 2023-02-13 NOTE — Assessment & Plan Note (Signed)
**Note De-Identified [place] Obfuscation** Chronic, ongoing with A1c 7.4% today with heavy focus on diet and exercise.  Metformin caused rash.  Praised for major success and weight loss. - Continue off Metformin due to side effect.  Could consider change to SGLT2 or Januvia if ongoing elevations in future.  She prefers to avoid injectables. Discussed with her goal is A1c <8% per the American Geriatric Society guidelines. - Continue Olmesartan for kidney protection with urine ALB 22 October 2022, some improvement from previous of 30 and 80 in past.  - Continue to monitor BS at home daily and document + focus on diet.   - Refuses vaccinations. - Eye and foot exam up to date. - Return in 3 months.

## 2023-02-13 NOTE — Assessment & Plan Note (Signed)
**Note De-Identified [place] Obfuscation** Chronic, stable.  BP at goal for age.  Discussed with her goal BP <130/80.  Continue Olmesartan at this time and adjust dose as needed, is tolerating well without ADR -- offers kidney protection with her diabetes.  Recommend she continue to monitor BP at home daily and document for provider + focus on DASH diet.  LABS: CMP.  Urine ALB 22 October 2022.

## 2023-02-14 LAB — COMPREHENSIVE METABOLIC PANEL
ALT: 28 IU/L (ref 0–32)
AST: 22 IU/L (ref 0–40)
Albumin: 4.5 g/dL (ref 3.8–4.8)
Alkaline Phosphatase: 103 IU/L (ref 44–121)
BUN/Creatinine Ratio: 19 (ref 12–28)
BUN: 16 mg/dL (ref 8–27)
Bilirubin Total: 0.6 mg/dL (ref 0.0–1.2)
CO2: 22 mmol/L (ref 20–29)
Calcium: 9.6 mg/dL (ref 8.7–10.3)
Chloride: 102 mmol/L (ref 96–106)
Creatinine, Ser: 0.85 mg/dL (ref 0.57–1.00)
Globulin, Total: 2.6 g/dL (ref 1.5–4.5)
Glucose: 120 mg/dL — ABNORMAL HIGH (ref 70–99)
Potassium: 5.1 mmol/L (ref 3.5–5.2)
Sodium: 138 mmol/L (ref 134–144)
Total Protein: 7.1 g/dL (ref 6.0–8.5)
eGFR: 72 mL/min/{1.73_m2} (ref >59)

## 2023-02-14 LAB — LIPID PANEL W/O CHOL/HDL RATIO
Cholesterol, Total: 120 mg/dL (ref 100–199)
HDL: 33 mg/dL — ABNORMAL LOW (ref >39)
LDL Chol Calc (NIH): 60 mg/dL (ref 0–99)
Triglycerides: 160 mg/dL — ABNORMAL HIGH (ref 0–149)
VLDL Cholesterol Cal: 27 mg/dL (ref 5–40)

## 2023-02-14 NOTE — Progress Notes (Signed)
**Note De-Identified [place] Obfuscation** Good afternoon, please call to see if she received below MyChart message: Good afternoon Suzanne Jennings, your labs have returned and everything continues to be stable.  Kidney and liver function normal.  Cholesterol levels show stable LDL, in fact it has lowered.  Triglycerides remain slightly elevated.  Continue diet and exercise focus.  Any questions? Keep being amazing!!  Thank you for allowing me to participate in your care.  I appreciate you. Kindest regards, Spencer Peterkin

## 2023-03-20 ENCOUNTER — Other Ambulatory Visit: Payer: Self-pay | Admitting: Family Medicine

## 2023-03-20 DIAGNOSIS — Z1231 Encounter for screening mammogram for malignant neoplasm of breast: Secondary | ICD-10-CM

## 2023-03-26 ENCOUNTER — Ambulatory Visit
Admission: RE | Admit: 2023-03-26 | Discharge: 2023-03-26 | Disposition: A | Discharge status: Home / Self Care | Payer: Medicare Other | Source: Ambulatory Visit | Attending: Family Medicine | Admitting: Family Medicine

## 2023-03-26 ENCOUNTER — Encounter: Payer: Self-pay | Admitting: Family Medicine

## 2023-03-26 ENCOUNTER — Ambulatory Visit (INDEPENDENT_AMBULATORY_CARE_PROVIDER_SITE_OTHER): Payer: Medicare Other | Admitting: Family Medicine

## 2023-03-26 VITALS — BP 138/81 | HR 80 | Temp 98.7°F | Wt 156.8 lb

## 2023-03-26 DIAGNOSIS — M25562 Pain in left knee: Secondary | ICD-10-CM | POA: Diagnosis not present

## 2023-03-26 DIAGNOSIS — M1712 Unilateral primary osteoarthritis, left knee: Secondary | ICD-10-CM | POA: Diagnosis not present

## 2023-03-26 MED ORDER — NAPROXEN 500 MG PO TABS: 500.0000 mg | ORAL_TABLET | Freq: Two times a day (BID) | ORAL | 1 refills | Status: DC

## 2023-03-26 NOTE — Progress Notes (Signed)
**Note De-Identified Hunger Obfuscation** BP 138/81   Pulse 80   Temp 98.7 F (37.1 C) (Oral)   Wt 156 lb 12.8 oz (71.1 kg)   LMP  (LMP Unknown)   SpO2 96%   BMI 30.11 kg/m    Subjective:    Patient ID: Suzanne Jennings, female    DOB: 1950-10-14, 72 y.o.   MRN: [id number]  HPI: Suzanne Jennings is a 72 y.o. female  Chief Complaint  Patient presents with   Knee Pain    Pt states she has been having L knee pain for a while. States she has been walking a lot and thinks she overdone it walking. States she was walking to the mailbox this afternoon and heard something pop in the back of her knee. States pain immediately got worse once she felt and heard the pop   KNEE PAIN Knee pain started two weeks ago, after alternating with heat and ice pain improved and resolved. Today patient felt her knee pop and fell on her bottom in the grass after going to the mailbox, denies hitting her head or loss of consciousness, or injuries.She is having difficulty with ambulation due to pain.  Duration: 2 weeks Involved knee: left Mechanism of injury:  Recent fall Location:anterior Onset: sudden Severity: 8/10  Quality:  throbbing Frequency: constant Radiation: yes back of left thigh to left ankle painful with extension  Aggravating factors: walking, running, bending, and movement  Alleviating factors:  Heat and ice  Status: stable Treatments attempted: none  Relief with NSAIDs?:  No NSAIDs Taken Weakness with weight bearing or walking: yes Sensation of giving way: No Locking: no Popping: no Bruising: no Swelling: no Redness: no Paresthesias/decreased sensation: no Fevers: no  Relevant past medical, surgical, family and social history reviewed and updated as indicated. Interim medical history since our last visit reviewed. Allergies and medications reviewed and updated.  Review of Systems  Constitutional:  Negative for fever.  Respiratory: Negative.    Cardiovascular: Negative.   Musculoskeletal:  Positive for arthralgias.   Neurological:  Positive for weakness.    Per HPI unless specifically indicated above     Objective:    BP 138/81   Pulse 80   Temp 98.7 F (37.1 C) (Oral)   Wt 156 lb 12.8 oz (71.1 kg)   LMP  (LMP Unknown)   SpO2 96%   BMI 30.11 kg/m   Wt Readings from Last 3 Encounters:  03/26/23 156 lb 12.8 oz (71.1 kg)  02/13/23 155 lb (70.3 kg)  11/10/22 156 lb 6.4 oz (70.9 kg)    Physical Exam Vitals and nursing note reviewed.  Constitutional:      General: She is awake. She is not in acute distress.    Appearance: Normal appearance. She is well-developed and well-groomed. She is not ill-appearing.  HENT:     Head: Normocephalic and atraumatic.     Right Ear: Hearing and external ear normal. No drainage.     Left Ear: Hearing and external ear normal. No drainage.     Nose: Nose normal.  Eyes:     General: Lids are normal.        Right eye: No discharge.        Left eye: No discharge.     Conjunctiva/sclera: Conjunctivae normal.  Cardiovascular:     Rate and Rhythm: Normal rate and regular rhythm.     Pulses:          Radial pulses are 2+ on the right side and 2+ on **Note De-Identified Uhde Obfuscation** the left side.       Popliteal pulses are 2+ on the right side and 2+ on the left side.       Posterior tibial pulses are 2+ on the right side and 2+ on the left side.     Heart sounds: Normal heart sounds, S1 normal and S2 normal. No murmur heard.    No gallop.  Pulmonary:     Effort: Pulmonary effort is normal. No accessory muscle usage or respiratory distress.     Breath sounds: Normal breath sounds.  Musculoskeletal:     Cervical back: Full passive range of motion without pain and normal range of motion.     Right knee: Normal pulse.     Left knee: Swelling present. Decreased range of motion. Tenderness present. Normal pulse.  Skin:    General: Skin is warm and dry.     Capillary Refill: Capillary refill takes less than 2 seconds.  Neurological:     Mental Status: She is alert and oriented to person,  place, and time.  Psychiatric:        Attention and Perception: Attention normal.        Mood and Affect: Mood normal.        Speech: Speech normal.        Behavior: Behavior normal. Behavior is cooperative.        Thought Content: Thought content normal.     Results for orders placed or performed in visit on 02/13/23  Bayer DCA Hb A1c Waived  Result Value Ref Range   HB A1C (BAYER DCA - WAIVED) 7.4 (H) 4.8 - 5.6 %  Comprehensive metabolic panel  Result Value Ref Range   Glucose 120 (H) 70 - 99 mg/dL   BUN 16 8 - 27 mg/dL   Creatinine, Ser 8.65 0.57 - 1.00 mg/dL   eGFR 72 >78 IO/NGE/9.52   BUN/Creatinine Ratio 19 12 - 28   Sodium 138 134 - 144 mmol/L   Potassium 5.1 3.5 - 5.2 mmol/L   Chloride 102 96 - 106 mmol/L   CO2 22 20 - 29 mmol/L   Calcium 9.6 8.7 - 10.3 mg/dL   Total Protein 7.1 6.0 - 8.5 g/dL   Albumin 4.5 3.8 - 4.8 g/dL   Globulin, Total 2.6 1.5 - 4.5 g/dL   Bilirubin Total 0.6 0.0 - 1.2 mg/dL   Alkaline Phosphatase 103 44 - 121 IU/L   AST 22 0 - 40 IU/L   ALT 28 0 - 32 IU/L  Lipid Panel w/o Chol/HDL Ratio  Result Value Ref Range   Cholesterol, Total 120 100 - 199 mg/dL   Triglycerides 841 (H) 0 - 149 mg/dL   HDL 33 (L) >32 mg/dL   VLDL Cholesterol Cal 27 5 - 40 mg/dL   LDL Chol Calc (NIH) 60 0 - 99 mg/dL      Assessment & Plan:   Problem List Items Addressed This Visit   None Visit Diagnoses     Acute pain of left knee    -  Primary   Acute, ongoing. Xray ordered, Naproxen BID given for pain relief. Recommend rest, heat, ice, and knee exercises. F/u in 1 month if pain remains.   Relevant Orders   DG Knee Complete 4 Views Left        Follow up plan: Return if symptoms worsen or fail to improve.

## 2023-03-26 NOTE — Patient Instructions (Addendum)
**Note De-Identified Prezioso Obfuscation** Continue with rest, ice, and elevation

## 2023-03-28 ENCOUNTER — Telehealth: Payer: Self-pay | Admitting: Nurse Practitioner

## 2023-03-28 NOTE — Telephone Encounter (Signed)
**Note De-Identified Wisz Obfuscation** Copied from CRM [id number]. Topic: General - Other >> Mar 27, 2023  5:47 PM Ja-Kwan M wrote: Reason for CRM: Pt requests call back to go over x-ray results. Cb# [phone]

## 2023-03-28 NOTE — Telephone Encounter (Signed)
**Note De-Identified Leiva Obfuscation** Results not finalized and read by radiology according to chart.   Called and LVM letting patient know that we are waiting for final results and will let her know about them as soon as we can.

## 2023-04-02 ENCOUNTER — Encounter: Payer: Self-pay | Admitting: *Deleted

## 2023-04-02 ENCOUNTER — Ambulatory Visit: Payer: Self-pay | Admitting: *Deleted

## 2023-04-02 DIAGNOSIS — M25562 Pain in left knee: Secondary | ICD-10-CM | POA: Insufficient documentation

## 2023-04-02 NOTE — Telephone Encounter (Signed)
**Note De-Identified Capriotti Obfuscation** Copied from CRM [id number]. Topic: General - Other >> Apr 02, 2023  8:37 AM Franchot Heidelberg wrote: Reason for CRM: Pt wants to discuss imaging results, she fell again 1 am Sunday morning. Transferred to triage.

## 2023-04-02 NOTE — Telephone Encounter (Signed)
**Note De-identified Nealis Obfuscation**  **Note De-Identified Echeverri Obfuscation** Chief Complaint: Fall Symptoms: Left knee gave out this AM 0100. Fell to right side. No bruising, no lacerations, no swelling. States "Just thought I should tell someone as I fell Last week. Same knee gave out." Frequency: This AM Pertinent Negatives: Patient denies any pain, injury Disposition: [] ED /[] Urgent Care (no appt availability in office) / [] Appointment(In office/virtual)/ []  [place] [facility]/ [] Home Care/ [] Refused Recommended Disposition /[] [place] [facility]/ [x]  Follow-up with PCP Additional Notes: Pt also calling for Xray of knee report from previous fall, not released to [facility].  States no injuries from this fall, but knee gave out again.  Please advise. Reason for Disposition  [1] Recent fall AND [2] no injury  Answer Assessment - Initial Assessment Questions 1. MECHANISM: "How did the fall happen?"     Left knee gave out 2. DOMESTIC VIOLENCE AND ELDER ABUSE SCREENING: "Did you fall because someone pushed you or tried to hurt you?" If Yes, ask: "Are you safe now?"     No 3. ONSET: "When did the fall happen?" (e.g., minutes, hours, or days ago)     Left knee gave out 4. LOCATION: "What part of the body hit the ground?" (e.g., back, buttocks, head, hips, knees, hands, head, stomach)     Right side 5. INJURY: "Did you hurt (injure) yourself when you fell?" If Yes, ask: "What did you injure? Tell me more about this?" (e.g., body area; type of injury; pain severity)"     No 6. PAIN: "Is there any pain?" If Yes, ask: "How bad is the pain?" (e.g., Scale 1-10; or mild,  moderate, severe)   - NONE (0): No pain   - MILD (1-3): Doesn't interfere with normal activities    - MODERATE (4-7): Interferes with normal activities or awakens from sleep    - SEVERE (8-10): Excruciating pain, unable to do any normal activities      1-2/10  at knee, always have. 7. SIZE: For cuts, bruises, or swelling, ask: "How large is it?" (e.g., inches or centimeters)      no 9. OTHER SYMPTOMS:  "Do you have any other symptoms?" (e.g., dizziness, fever, weakness; new onset or worsening).      No 10. CAUSE: "What do you think caused the fall (or falling)?" (e.g., tripped, dizzy spell)       Knee gives out  Protocols used: Falls and [facility]

## 2023-04-02 NOTE — Addendum Note (Signed)
**Note De-Identified Reh Obfuscation** Addended by: Aura Dials T on: 04/02/2023 03:38 PM   Modules accepted: Orders

## 2023-04-02 NOTE — Telephone Encounter (Signed)
**Note De-Identified Granlund Obfuscation** This encounter was created in error - please disregard. See addended NT noted

## 2023-04-02 NOTE — Telephone Encounter (Signed)
**Note De-Identified Conly Obfuscation** Called and LVM asking for patient to please return my call.   OK for [facility] to give result message from [facility] if the patient calls back.

## 2023-04-02 NOTE — Telephone Encounter (Signed)
**Note De-Identified Bunyan Obfuscation** Routing to patient's PCP as provider who saw the patient is out of the office. Xray results were finalized yesterday.

## 2023-04-02 NOTE — Telephone Encounter (Addendum)
**Note De-Identified Elahi Obfuscation** Pt given x ray results per notes of J. Harvest Dark , NP  on 04/02/23. Pt verbalized understanding and reports she would like referral to ortho regarding knee.

## 2023-04-05 ENCOUNTER — Telehealth: Payer: Self-pay | Admitting: Nurse Practitioner

## 2023-04-05 NOTE — Telephone Encounter (Signed)
**Note De-Identified Fahey Obfuscation** Left message on voice mail  and office number for patient to call to assist with scheduling her mammogram. Put in CRM for [facility]

## 2023-04-05 NOTE — Telephone Encounter (Signed)
**Note De-identified Yurkovich Obfuscation** -----  **Note De-Identified Mooradian Obfuscation** Message from [facility] [place] R sent at 04/05/2023  2:25 PM EDT -----  ----- Message ----- From: Dorcas Carrow, DO Sent: 03/20/2023   9:58 PM EDT To: Pablo Ledger, CMA  Please schedule mammo

## 2023-04-10 ENCOUNTER — Other Ambulatory Visit: Payer: Self-pay | Admitting: Family Medicine

## 2023-04-11 NOTE — Telephone Encounter (Signed)
**Note De-Identified Fritchman Obfuscation** Labs not due since just started on this 2 weeks ago.  Requested Prescriptions  Pending Prescriptions Disp Refills   naproxen (NAPROSYN) 500 MG tablet [Pharmacy Med Name: NAPROXEN 500 MG TAB] 30 tablet 1    Sig: TAKE 1 TABLET BY MOUTH TWICE DAILY WITH A MEAL     Analgesics:  NSAIDS Failed - 04/10/2023  9:18 AM      Failed - Manual Review: Labs are only required if the patient has taken medication for more than 8 weeks.      Failed - HGB in normal range and within 360 days    Hemoglobin  Date Value Ref Range Status  09/27/2021 14.8 11.1 - 15.9 g/dL Final         Failed - PLT in normal range and within 360 days    Platelets  Date Value Ref Range Status  09/27/2021 256 150 - 450 x10E3/uL Final         Failed - HCT in normal range and within 360 days    Hematocrit  Date Value Ref Range Status  09/27/2021 43.8 34.0 - 46.6 % Final         Passed - Cr in normal range and within 360 days    Creatinine, Ser  Date Value Ref Range Status  02/13/2023 0.85 0.57 - 1.00 mg/dL Final         Passed - eGFR is 30 or above and within 360 days    GFR calc Af Amer  Date Value Ref Range Status  09/01/2020 91 >59 mL/min/1.73 Final    Comment:    **In accordance with recommendations from the NKF-ASN Task force,**   [facility] is in the process of updating its eGFR calculation to the   2021 CKD-EPI creatinine equation that estimates kidney function   without a race variable.    GFR calc non Af Amer  Date Value Ref Range Status  09/01/2020 79 >59 mL/min/1.73 Final   eGFR  Date Value Ref Range Status  02/13/2023 72 >59 mL/min/1.73 Final         Passed - Patient is not pregnant      Passed - Valid encounter within last 12 months    Recent Outpatient Visits           2 weeks ago Acute pain of left knee   [place] [facility] [place], Sherran Needs, NP   1 month ago Type 2 diabetes mellitus with morbid obesity (HCC)   [place] [facility] [place], [place]  T, NP   5 months ago Type 2 diabetes mellitus with morbid obesity (HCC)   [place] [facility] [place], Corrie Dandy T, NP   8 months ago Medicare annual wellness visit, subsequent   [place] [facility] [place], Castana T, NP   10 months ago Type 2 diabetes mellitus with morbid obesity (HCC)   [place] [facility] [place], Dorie Rank, NP       Future Appointments             In 1 month Cannady, Dorie Rank, NP [facility], [facility]

## 2023-04-18 ENCOUNTER — Other Ambulatory Visit: Payer: Self-pay | Admitting: Nurse Practitioner

## 2023-04-19 NOTE — Telephone Encounter (Signed)
**Note De-Identified Heitzenrater Obfuscation** Requested Prescriptions  Pending Prescriptions Disp Refills   olmesartan (BENICAR) 20 MG tablet [Pharmacy Med Name: Olmesartan Medoxomil 20 MG Oral Tablet] 100 tablet 1    Sig: TAKE 1 TABLET BY MOUTH DAILY     Cardiovascular:  Angiotensin Receptor Blockers Passed - 04/18/2023 10:36 PM      Passed - Cr in normal range and within 180 days    Creatinine, Ser  Date Value Ref Range Status  02/13/2023 0.85 0.57 - 1.00 mg/dL Final         Passed - K in normal range and within 180 days    Potassium  Date Value Ref Range Status  02/13/2023 5.1 3.5 - 5.2 mmol/L Final         Passed - Patient is not pregnant      Passed - Last BP in normal range    BP Readings from Last 1 Encounters:  03/26/23 138/81         Passed - Valid encounter within last 6 months    Recent Outpatient Visits           3 weeks ago Acute pain of left knee   [place] [facility] [place], Sherran Needs, NP   2 months ago Type 2 diabetes mellitus with morbid obesity (HCC)   [place] [facility] [place], [employer] [facility] T, NP   5 months ago Type 2 diabetes mellitus with morbid obesity (HCC)   [place] [facility] [place], Corrie Dandy T, NP   8 months ago Medicare annual wellness visit, subsequent   [place] [facility] [place], Carlisle T, NP   11 months ago Type 2 diabetes mellitus with morbid obesity (HCC)   [place] [facility] [place], Dorie Rank, NP       Future Appointments             In 3 weeks Cannady, Dorie Rank, NP [facility], [facility]

## 2023-04-25 DIAGNOSIS — M2242 Chondromalacia patellae, left knee: Secondary | ICD-10-CM | POA: Diagnosis not present

## 2023-05-16 ENCOUNTER — Ambulatory Visit: Payer: Medicare Other | Admitting: Nurse Practitioner

## 2023-05-16 DIAGNOSIS — E1169 Type 2 diabetes mellitus with other specified complication: Secondary | ICD-10-CM

## 2023-05-16 DIAGNOSIS — E6609 Other obesity due to excess calories: Secondary | ICD-10-CM

## 2023-05-16 DIAGNOSIS — I152 Hypertension secondary to endocrine disorders: Secondary | ICD-10-CM

## 2023-05-16 DIAGNOSIS — I700 Atherosclerosis of aorta: Secondary | ICD-10-CM

## 2023-07-10 DIAGNOSIS — M2242 Chondromalacia patellae, left knee: Secondary | ICD-10-CM | POA: Diagnosis not present

## 2023-07-24 IMAGING — MG MM DIGITAL SCREENING BILAT W/ TOMO AND CAD
8 series · 8 of 24 positions shown · non-contrast
Comparison: Previous exam(s).

CLINICAL DATA: Screening.

EXAM:
DIGITAL SCREENING BILATERAL MAMMOGRAM WITH TOMOSYNTHESIS AND CAD
TECHNIQUE: Bilateral screening digital craniocaudal and mediolateral oblique
mammograms were obtained. Bilateral screening digital breast
tomosynthesis was performed. The images were evaluated with
computer-aided detection.

[R CC synth-2D]
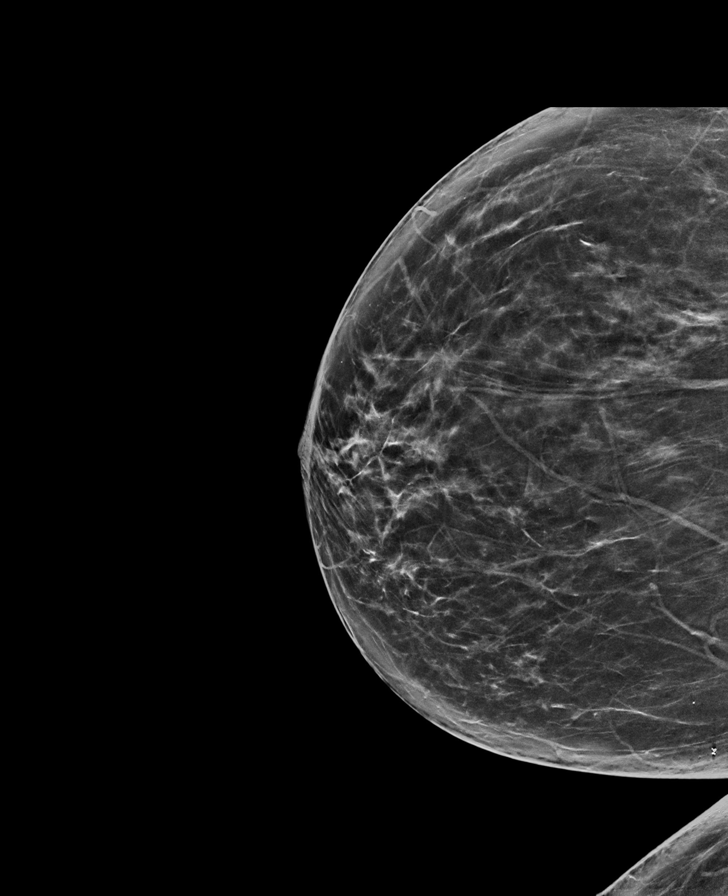

[L MLO synth-2D]
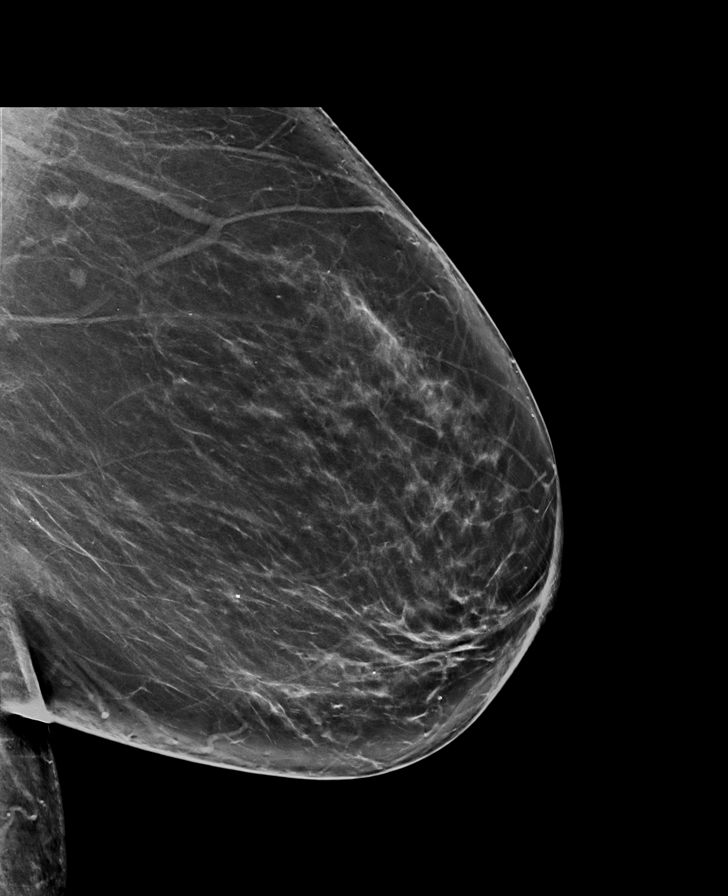

[R MLO synth-2D]
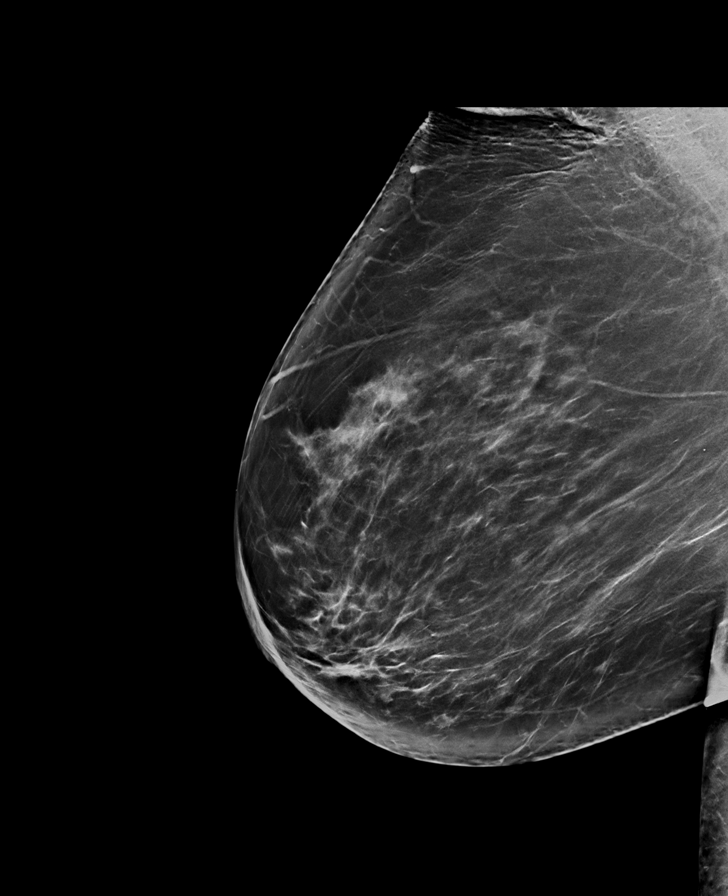

[L CC synth-2D]
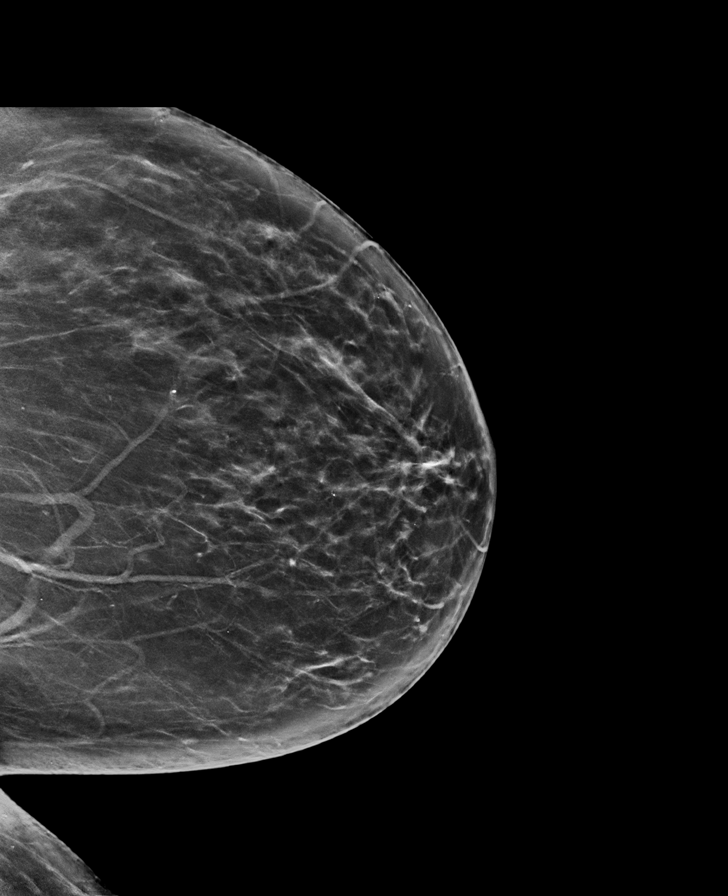

[R MLO tomo · tomo slice 45/89.0]
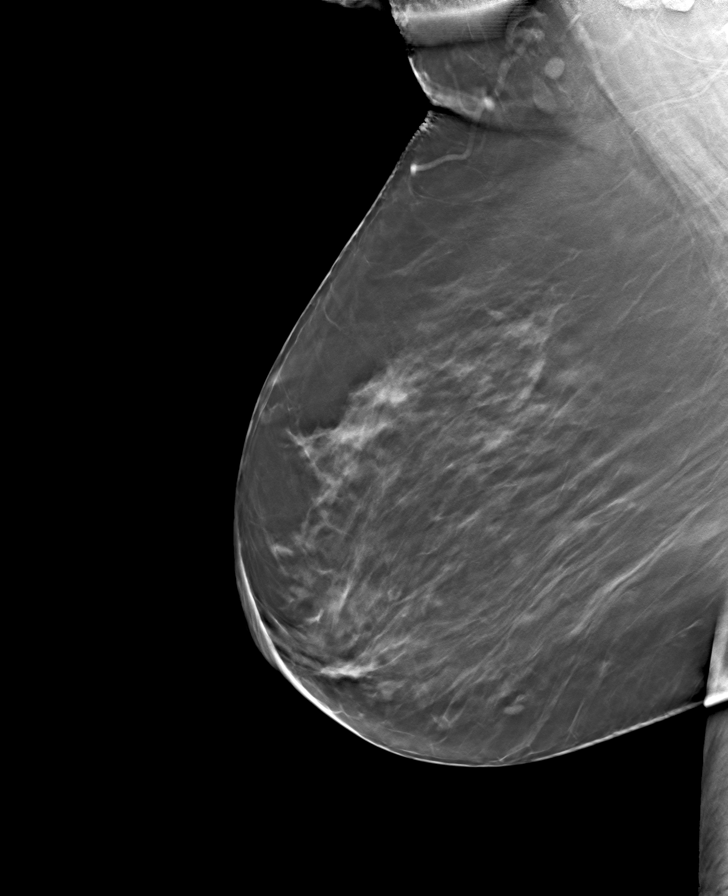

[L CC tomo · tomo slice 39/76.0]
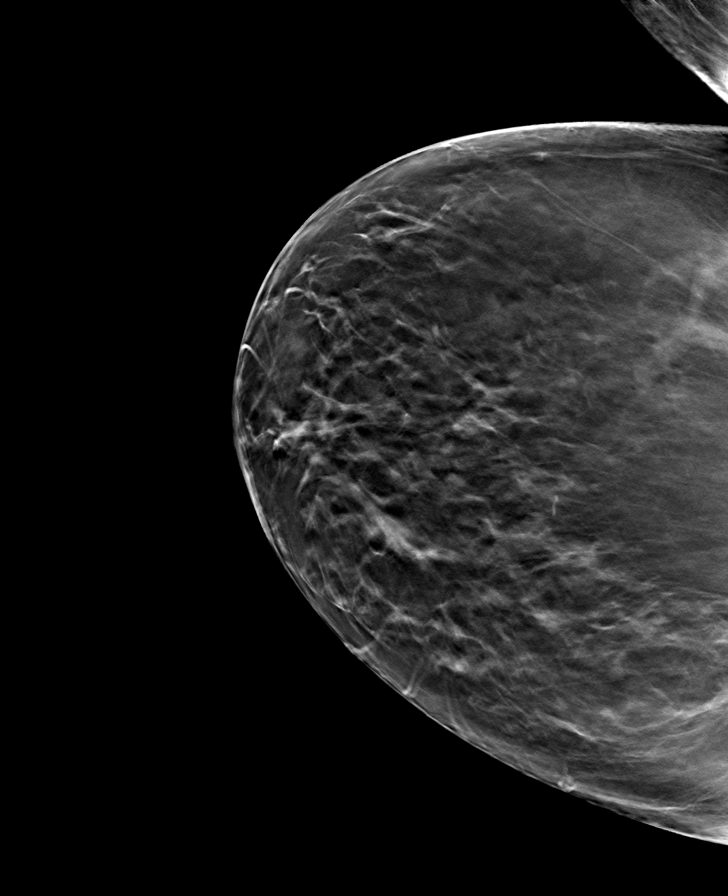

[L MLO tomo · tomo slice 45/88.0]
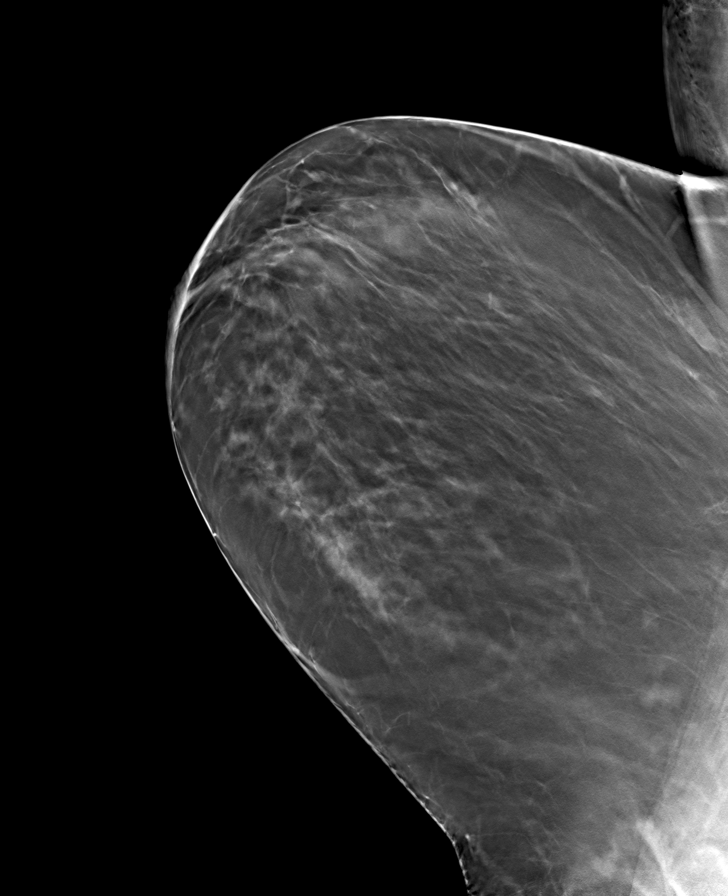

[R CC tomo · tomo slice 37/72.0]
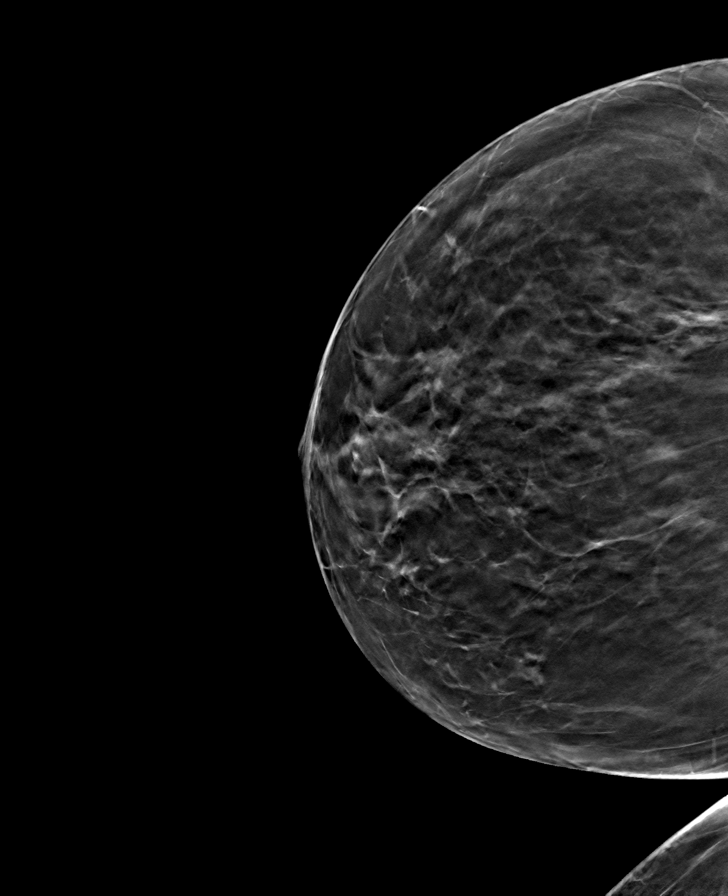

[8 of 24 positions shown; findings below may reference images not displayed]

ACR Breast Density Category b: There are scattered areas of
fibroglandular density.
FINDINGS: There are no findings suspicious for malignancy.
IMPRESSION: No mammographic evidence of malignancy. A result letter of this
screening mammogram will be mailed directly to the patient.

RECOMMENDATION:
Screening mammogram in one year. (Code:51-O-LD2)

BI-RADS CATEGORY  1: Negative.

## 2023-07-31 DIAGNOSIS — M2242 Chondromalacia patellae, left knee: Secondary | ICD-10-CM | POA: Diagnosis not present

## 2023-08-03 DIAGNOSIS — M2242 Chondromalacia patellae, left knee: Secondary | ICD-10-CM | POA: Diagnosis not present

## 2023-08-06 DIAGNOSIS — M2242 Chondromalacia patellae, left knee: Secondary | ICD-10-CM | POA: Diagnosis not present

## 2023-08-12 ENCOUNTER — Encounter: Payer: Self-pay | Admitting: Emergency Medicine

## 2023-08-13 ENCOUNTER — Ambulatory Visit: Payer: Medicare Other | Admitting: Emergency Medicine

## 2023-08-13 VITALS — Ht 60.0 in | Wt 157.0 lb

## 2023-08-13 DIAGNOSIS — Z1211 Encounter for screening for malignant neoplasm of colon: Secondary | ICD-10-CM

## 2023-08-13 DIAGNOSIS — Z0000 Encounter for general adult medical examination without abnormal findings: Secondary | ICD-10-CM

## 2023-08-13 NOTE — Patient Instructions (Addendum)
**Note De-Identified Durocher Obfuscation** [name] , Thank you for taking time to come for your Medicare Wellness Visit. I appreciate your ongoing commitment to your health goals. Please review the following plan we discussed and let me know if I can assist you in the future.   Referrals/Orders/Follow-Ups/Clinician Recommendations: I have scheduled you an office visit with Aura Dials, NP for 08/23/23 @ 9:20am. You will need a diabetic foot exam at this appointment. Call [facility] @ [phone] to schedule your mammogram. I placed a referral to Gastroenterology for a colonoscopy. Someone will call you with that appointment.  This is a list of the screening recommended for you and due dates:  Health Maintenance  Topic Date Due   Pneumonia Vaccine (1 of 2 - PCV) Never done   Zoster (Shingles) Vaccine (1 of 2) Never done   Colon Cancer Screening  07/03/2021   Mammogram  03/29/2023   Complete foot exam   04/04/2023   Flu Shot  11/12/2023*   Hemoglobin A1C  08/16/2023   Eye exam for diabetics  09/15/2023   Yearly kidney health urinalysis for diabetes  11/10/2023   Yearly kidney function blood test for diabetes  02/13/2024   Medicare Annual Wellness Visit  08/12/2024   DTaP/Tdap/Td vaccine (2 - Tdap) 09/16/2025   DEXA scan (bone density measurement)  10/04/2025   Hepatitis C Screening  Completed   HPV Vaccine  Aged Out   COVID-19 Vaccine  Discontinued  *Topic was postponed. The date shown is not the original due date.    Advanced directives: (Copy Requested) Please bring a copy of your health care power of attorney and living will to the office to be added to your chart at your convenience.  Next Medicare Annual Wellness Visit scheduled for next year: Yes, 08/19/24 @ 8:00am  Fall Prevention in the Home, Adult Falls can cause injuries and affect people of all ages. There are many simple things that you can do to make your home safe and to help prevent falls. If you need it, ask for help making these changes. What  actions can I take to prevent falls? General information Use good lighting in all rooms. Make sure to: Replace any light bulbs that burn out. Turn on lights if it is dark and use night-lights. Keep items that you use often in easy-to-reach places. Lower the shelves around your home if needed. Move furniture so that there are clear paths around it. Do not keep throw rugs or other things on the floor that can make you trip. If any of your floors are uneven, fix them. Add color or contrast paint or tape to clearly mark and help you see: Grab bars or handrails. First and last steps of staircases. Where the edge of each step is. If you use a ladder or stepladder: Make sure that it is fully opened. Do not climb a closed ladder. Make sure the sides of the ladder are locked in place. Have someone hold the ladder while you use it. Know where your pets are as you move through your home. What can I do in the bathroom?     Keep the floor dry. Clean up any water that is on the floor right away. Remove soap buildup in the bathtub or shower. Buildup makes bathtubs and showers slippery. Use non-skid mats or decals on the floor of the bathtub or shower. Attach bath mats securely with double-sided, non-slip rug tape. If you need to sit down while you are in the shower, use a non-slip **Note De-Identified Crumm Obfuscation** stool. Install grab bars by the toilet and in the bathtub and shower. Do not use towel bars as grab bars. What can I do in the bedroom? Make sure that you have a light by your bed that is easy to reach. Do not use any sheets or blankets on your bed that hang to the floor. Have a firm bench or chair with side arms that you can use for support when you get dressed. What can I do in the kitchen? Clean up any spills right away. If you need to reach something above you, use a sturdy step stool that has a grab bar. Keep electrical cables out of the way. Do not use floor polish or wax that makes floors slippery. What can I  do with my stairs? Do not leave anything on the stairs. Make sure that you have a light switch at the top and the bottom of the stairs. Have them installed if you do not have them. Make sure that there are handrails on both sides of the stairs. Fix handrails that are broken or loose. Make sure that handrails are as long as the staircases. Install non-slip stair treads on all stairs in your home if they do not have carpet. Avoid having throw rugs at the top or bottom of stairs, or secure the rugs with carpet tape to prevent them from moving. Choose a carpet design that does not hide the edge of steps on the stairs. Make sure that carpet is firmly attached to the stairs. Fix any carpet that is loose or worn. What can I do on the outside of my home? Use bright outdoor lighting. Repair the edges of walkways and driveways and fix any cracks. Clear paths of anything that can make you trip, such as tools or rocks. Add color or contrast paint or tape to clearly mark and help you see high doorway thresholds. Trim any bushes or trees on the main path into your home. Check that handrails are securely fastened and in good repair. Both sides of all steps should have handrails. Install guardrails along the edges of any raised decks or porches. Have leaves, snow, and ice cleared regularly. Use sand, salt, or ice melt on walkways during winter months if you live where there is ice and snow. In the garage, clean up any spills right away, including grease or oil spills. What other actions can I take? Review your medicines with your health care provider. Some medicines can make you confused or feel dizzy. This can increase your chance of falling. Wear closed-toe shoes that fit well and support your feet. Wear shoes that have rubber soles and low heels. Use a cane, walker, scooter, or crutches that help you move around if needed. Talk with your provider about other ways that you can decrease your risk of falls. This  may include seeing a physical therapist to learn to do exercises to improve movement and strength. Where to find more information Centers for Disease Control and Prevention, STEAD[employer]: TonerPromos.no [employer] on Aging: BaseRingTones.pl National Institute on Aging: BaseRingTones.pl Contact a health care provider if: You are afraid of falling at home. You feel weak, drowsy, or dizzy at home. You fall at home. Get help right away if you: Lose consciousness or have trouble moving after a fall. Have a fall that causes a head injury. These symptoms may be an emergency. Get help right away. Call 911. Do not wait to see if the symptoms will go away. Do not drive yourself **Note De-Identified Stepp Obfuscation** to the hospital. This information is not intended to replace advice given to you by your health care provider. Make sure you discuss any questions you have with your health care provider. Document Revised: 04/03/2022 Document Reviewed: 04/03/2022 Elsevier Patient Education  2024 [employer].

## 2023-08-13 NOTE — Progress Notes (Signed)
**Note De-Identified Mcclees Obfuscation** Subjective:   Suzanne Jennings is a 72 y.o. female who presents for Medicare Annual (Subsequent) preventive examination.  Visit Complete: Virtual I connected with  [name] on 08/13/23 by a audio enabled telemedicine application and verified that I am speaking with the correct person using two identifiers.  Patient Location: Home  Provider Location: Office/Clinic  I discussed the limitations of evaluation and management by telemedicine. The patient expressed understanding and agreed to proceed.  Vital Signs: Because this visit was a virtual/telehealth visit, some criteria may be missing or patient reported. Any vitals not documented were not able to be obtained and vitals that have been documented are patient reported.   Cardiac Risk Factors include: advanced age (>52men, >44 women);diabetes mellitus;dyslipidemia;hypertension;obesity (BMI >30kg/m2)     Objective:    Today's Vitals   08/13/23 0813  Weight: 157 lb (71.2 kg)  Height: 5' (1.524 m)   Body mass index is 30.66 kg/m.     08/13/2023    8:26 AM 08/09/2021    9:53 AM 02/09/2020    9:12 AM 10/02/2018    9:31 AM 03/20/2018   10:44 AM 01/30/2018   10:53 AM 03/29/2017    3:14 PM  Advanced Directives  Does Patient Have a Medical Advance Directive? No No Yes No No No No  Type of Surveyor, minerals;Living will      Copy of Healthcare Power of Attorney in Chart?   No - copy requested      Would patient like information on creating a medical advance directive? No - Patient declined No - Patient declined  No - Patient declined No - Patient declined Yes (MAU/Ambulatory/Procedural Areas - Information given) No - Patient declined    Current Medications (verified) Outpatient Encounter Medications as of 08/13/2023  Medication Sig   Apoaequorin (PREVAGEN) 10 MG CAPS Take 1 capsule by mouth daily.   aspirin EC 81 MG tablet Take 1 tablet (81 mg total) by mouth daily.   atorvastatin (LIPITOR) 80 MG  tablet TAKE 1 TABLET BY MOUTH ONCE  DAILY   Blood Glucose Monitoring Suppl (ONETOUCH VERIO FLEX SYSTEM) w/Device KIT Use to check blood sugar 3-4 times a day and document for provider visits.  Goal less then 130 in morning fasting and less then 180 two hours after a meal.   cholecalciferol (VITAMIN D3) 25 MCG (1000 UNIT) tablet Take 1,000 Units by mouth daily.   olmesartan (BENICAR) 20 MG tablet TAKE 1 TABLET BY MOUTH DAILY   ONETOUCH VERIO test strip Use to check sugar 2-3 times daily.   OVER THE COUNTER MEDICATION Take 1 capsule by mouth daily. Zendocrine otc supplement   OVER THE COUNTER MEDICATION Take 2 capsules by mouth daily. Moerie Ultimate hair growth   Probiotic Product (PROBIOTIC PO) Take 1 capsule by mouth daily.    naproxen (NAPROSYN) 500 MG tablet TAKE 1 TABLET BY MOUTH TWICE DAILY WITH A MEAL (Patient not taking: Reported on 08/13/2023)   No facility-administered encounter medications on file as of 08/13/2023.    Allergies (verified) Metformin and related, Aspirin, Other, Penicillins, and Sulfa antibiotics   History: Past Medical History:  Diagnosis Date   Diabetes mellitus without complication (HCC)    Endometrial cancer (HCC)    History of kidney stones    Hyperlipidemia    Hypertension    Kidney stone    Knee pain    Past Surgical History:  Procedure Laterality Date   APPENDECTOMY     COLONOSCOPY **Note De-Identified Gabor Obfuscation** WITH PROPOFOL N/A 07/03/2016   Procedure: COLONOSCOPY WITH PROPOFOL;  Surgeon: Midge Minium, MD;  Location: [facility];  Service: Endoscopy;  Laterality: N/A;   LAPAROSCOPIC HYSTERECTOMY N/A 02/20/2018   Procedure: HYSTERECTOMY TOTAL LAPAROSCOPIC, WITH BILATERAL SALPINGOOPHERECTOMY;  Surgeon: Leida Lauth, MD;  Location: [facility];  Service: Gynecology;  Laterality: N/A;   POLYPECTOMY  07/03/2016   Procedure: POLYPECTOMY;  Surgeon: Midge Minium, MD;  Location: [facility];  Service: Endoscopy;;   SENTINEL NODE BIOPSY N/A 02/20/2018   Procedure: SENTINEL  NODE BIOPSY AND MAPPING;  Surgeon: Leida Lauth, MD;  Location: [facility];  Service: Gynecology;  Laterality: N/A;   TUBAL LIGATION     Family History  Problem Relation Age of Onset   Emphysema Mother    Heart disease Father        massive MI   Hypertension Sister    Edema Sister    Hypertension Sister    Hypertension Sister    Mental illness Brother    Diabetes Maternal Grandmother    Breast cancer Neg Hx    Social History   Socioeconomic History   Marital status: Married    Spouse name: Public house manager   Number of children: 2   Years of education: Not on file   Highest education level: Not on file  Occupational History   Occupation: retired  Tobacco Use   Smoking status: Never   Smokeless tobacco: Never  Vaping Use   Vaping status: Never Used  Substance and Sexual Activity   Alcohol use: No    Alcohol/week: 0.0 standard drinks of alcohol   Drug use: No   Sexual activity: Yes    Birth control/protection: Post-menopausal  Other Topics Concern   Not on file  Social History Narrative   2 adult children, 6 grandchildren   Social Drivers of Corporate investment banker Strain: Low Risk  (08/13/2023)   Overall Financial Resource Strain (CARDIA)    Difficulty of Paying Living Expenses: Not hard at all  Food Insecurity: No Food Insecurity (08/13/2023)   Hunger Vital Sign    Worried About Running Out of Food in the Last Year: Never true    Ran Out of Food in the Last Year: Never true  Transportation Needs: No Transportation Needs (08/13/2023)   PRAPARE - Administrator, Civil Service (Medical): No    Lack of Transportation (Non-Medical): No  Physical Activity: Sufficiently Active (08/13/2023)   Exercise Vital Sign    Days of Exercise per Week: 7 days    Minutes of Exercise per Session: 30 min  Stress: No Stress Concern Present (08/13/2023)   [employer] of Occupational Health - Occupational Stress Questionnaire    Feeling of Stress : Not at all  Social  Connections: Moderately Integrated (08/13/2023)   Social Connection and Isolation Panel [NHANES]    Frequency of Communication with Friends and Family: More than three times a week    Frequency of Social Gatherings with Friends and Family: Three times a week    Attends Religious Services: More than 4 times per year    Active Member of Clubs or Organizations: No    Attends Banker Meetings: Never    Marital Status: Married    Tobacco Counseling Counseling given: Not Answered   Clinical Intake:  Pre-visit preparation completed: Yes  Pain : No/denies pain     BMI - recorded: 30.66 Nutritional Status: BMI > 30  Obese Nutritional Risks: None Diabetes: Yes CBG done?: No (FBS 118 **Note De-Identified Kugler Obfuscation** per patient) Did pt. bring in CBG monitor from home?: No  How often do you need to have someone help you when you read instructions, pamphlets, or other written materials from your doctor or pharmacy?: 1 - Never  Interpreter Needed?: No  Information entered by :: Tora Kindred, CMA   Activities of Daily Living    08/13/2023    8:15 AM  In your present state of health, do you have any difficulty performing the following activities:  Hearing? 0  Vision? 0  Difficulty concentrating or making decisions? 0  Walking or climbing stairs? 1  Comment trouble with stairs because of arthritis knees  Dressing or bathing? 0  Doing errands, shopping? 0  Preparing Food and eating ? N  Using the Toilet? N  In the past six months, have you accidently leaked urine? N  Do you have problems with loss of bowel control? N  Managing your Medications? N  Managing your Finances? N  Housekeeping or managing your Housekeeping? N    Patient Care Team: Marjie Skiff, NP as PCP - General (Nurse Practitioner) Benita Gutter, RN as Registered Nurse  Indicate any recent Medical Services you may have received from other than [facility] providers in the past year (date may be approximate).     Assessment:    This is a routine wellness examination for Suzanne Jennings.  Hearing/Vision screen Hearing Screening - Comments:: Denies hearing loss Vision Screening - Comments:: Gets eye exams   Goals Addressed               This Visit's Progress     Weight (lb) < 120 lb (54.4 kg) (pt-stated)   157 lb (71.2 kg)     Depression Screen    08/13/2023    8:24 AM 02/13/2023    9:15 AM 11/10/2022   10:49 AM 08/11/2022   10:41 AM 05/18/2022   10:30 AM 04/03/2022   10:34 AM 09/27/2021    1:25 PM  PHQ 2/9 Scores  PHQ - 2 Score 0 0 0 0 0 0 0  PHQ- 9 Score  0 0 0 0 0 0    Fall Risk    08/13/2023    8:27 AM 02/13/2023    9:14 AM 08/11/2022   11:08 AM 08/11/2022   10:41 AM 05/18/2022   10:30 AM  Fall Risk   Falls in the past year? 1 0 0 0 0  Number falls in past yr: 1 0 0 0 0  Injury with Fall? 0 0 0 0 0  Risk for fall due to : History of fall(s);Impaired balance/gait;Orthopedic patient No Fall Risks No Fall Risks  No Fall Risks  Follow up Education provided;Falls prevention discussed;Falls evaluation completed Falls evaluation completed Falls prevention discussed Falls evaluation completed Falls evaluation completed    MEDICARE RISK AT HOME: Medicare Risk at Home Any stairs in or around the home?: Yes If so, are there any without handrails?: No Home free of loose throw rugs in walkways, pet beds, electrical cords, etc?: Yes Adequate lighting in your home to reduce risk of falls?: Yes Life alert?: No Use of a cane, walker or w/c?: No Grab bars in the bathroom?: Yes Shower chair or bench in shower?: No Elevated toilet seat or a handicapped toilet?: No  TIMED UP AND GO:  Was the test performed?  No    Cognitive Function:        08/13/2023    8:29 AM 08/11/2022   11:10 AM 02/09/2020 **Note De-Identified Rickels Obfuscation** 9:15 AM  6CIT Screen  What Year? 0 points 0 points 0 points  What month? 0 points 0 points 0 points  What time? 0 points 0 points 0 points  Count back from 20 0 points 0 points 0 points  Months in reverse  2 points 0 points 0 points  Repeat phrase 0 points 0 points 4 points  Total Score 2 points 0 points 4 points    Immunizations Immunization History  Administered Date(s) Administered   PFIZER(Purple Top)SARS-COV-2 Vaccination 12/09/2019, 12/30/2019   Td 09/17/2015    TDAP status: Up to date  Flu Vaccine status: Declined, Education has been provided regarding the importance of this vaccine but patient still declined. Advised may receive this vaccine at local pharmacy or Health Dept. Aware to provide a copy of the vaccination record if obtained from local pharmacy or Health Dept. Verbalized acceptance and understanding.  Pneumococcal vaccine status: Declined,  Education has been provided regarding the importance of this vaccine but patient still declined. Advised may receive this vaccine at local pharmacy or Health Dept. Aware to provide a copy of the vaccination record if obtained from local pharmacy or Health Dept. Verbalized acceptance and understanding.   Covid-19 vaccine status: Declined, Education has been provided regarding the importance of this vaccine but patient still declined. Advised may receive this vaccine at local pharmacy or Health Dept.or vaccine clinic. Aware to provide a copy of the vaccination record if obtained from local pharmacy or Health Dept. Verbalized acceptance and understanding.  Qualifies for Shingles Vaccine? Yes   Zostavax completed No   Shingrix Completed?: No.    Education has been provided regarding the importance of this vaccine. Patient has been advised to call insurance company to determine out of pocket expense if they have not yet received this vaccine. Advised may also receive vaccine at local pharmacy or Health Dept. Verbalized acceptance and understanding. Patient declined.  Screening Tests Health Maintenance  Topic Date Due   Pneumonia Vaccine 66+ Years old (1 of 2 - PCV) Never done   Zoster Vaccines- Shingrix (1 of 2) Never done   Colonoscopy   07/03/2021   MAMMOGRAM  03/29/2023   FOOT EXAM  04/04/2023   INFLUENZA VACCINE  11/12/2023 (Originally 03/15/2023)   HEMOGLOBIN A1C  08/16/2023   OPHTHALMOLOGY EXAM  09/15/2023   Diabetic kidney evaluation - Urine ACR  11/10/2023   Diabetic kidney evaluation - eGFR measurement  02/13/2024   Medicare Annual Wellness (AWV)  08/12/2024   DTaP/Tdap/Td (2 - Tdap) 09/16/2025   DEXA SCAN  10/04/2025   Hepatitis C Screening  Completed   HPV VACCINES  Aged Out   COVID-19 Vaccine  Discontinued    Health Maintenance  Health Maintenance Due  Topic Date Due   Pneumonia Vaccine 56+ Years old (1 of 2 - PCV) Never done   Zoster Vaccines- Shingrix (1 of 2) Never done   Colonoscopy  07/03/2021   MAMMOGRAM  03/29/2023   FOOT EXAM  04/04/2023    Colorectal cancer screening: Referral to GI placed 08/13/23. Pt aware the office will call re: appt.  Mammogram status: Ordered 03/20/23. Pt provided with contact info and advised to call to schedule appt.   Bone Density status: Completed 10/05/15. Results reflect: Bone density results: NORMAL. Repeat every 10 years.  Lung Cancer Screening: (Low Dose CT Chest recommended if Age 76-80 years, 20 pack-year currently smoking OR have quit w/in 15years.) does not qualify.   Lung Cancer Screening Referral: n/a  Additional Screening:  Hepatitis **Note De-Identified Boullion Obfuscation** C Screening: does not qualify; Completed 08/30/15  Vision Screening: Recommended annual ophthalmology exams for early detection of glaucoma and other disorders of the eye. Is the patient up to date with their annual eye exam?  Yes  Who is the provider or what is the name of the office in which the patient attends annual eye exams? [facility],  If pt is not established with a provider, would they like to be referred to a provider to establish care? No .   Dental Screening: Recommended annual dental exams for proper oral hygiene  Diabetic Foot Exam: Diabetic Foot Exam: Overdue, Pt has been advised about the  importance in completing this exam. Pt is scheduled for diabetic foot exam on 08/23/23 at next OV.  Community Resource Referral / Chronic Care Management: CRR required this visit?  No   CCM required this visit?  No     Plan:     I have personally reviewed and noted the following in the patient's chart:   Medical and social history Use of alcohol, tobacco or illicit drugs  Current medications and supplements including opioid prescriptions. Patient is not currently taking opioid prescriptions. Functional ability and status Nutritional status Physical activity Advanced directives List of other physicians Hospitalizations, surgeries, and ER visits in previous 12 months Vitals Screenings to include cognitive, depression, and falls Referrals and appointments  In addition, I have reviewed and discussed with patient certain preventive protocols, quality metrics, and best practice recommendations. A written personalized care plan for preventive services as well as general preventive health recommendations were provided to patient.     Tora Kindred, CMA   08/13/2023   After Visit Summary: (Mail) Due to this being a telephonic visit, the after visit summary with patients personalized plan was offered to patient [place] mail   Nurse Notes:  6 CIT Score - 2 Declined DM & Nutrition Education Declined flu, pneumonia and Covid vaccines. Placed referral to GI for colonoscopy that was due 2022. Reminded patient to schedule MMG that was ordered 03/20/23. Rescheduled OV from 05/16/23 that was cancelled to 08/23/23. Needs DM foot exam at next OV 08/23/23

## 2023-08-17 NOTE — Patient Instructions (Addendum)
**Note De-Identified Amis Obfuscation** Alpha Lipoic Acid and Vitamin B12 for neuropathy -- over the counter there is a product called Nervive  Please call to schedule your mammogram and/or bone density: [facility]  Address: [address], [place], [place] [address] Phone: [phone]  [facility] [address] [place],  [place]  [address] Phone: [phone]     Be Involved in Caring For Your Health:  Taking Medications When medications are taken as directed, they can greatly improve your health. But if they are not taken as prescribed, they may not work. In some cases, not taking them correctly can be harmful. To help ensure your treatment remains effective and safe, understand your medications and how to take them. Bring your medications to each visit for review by your provider.  Your lab results, notes, and after visit summary will be available on My Chart. We strongly encourage you to use this feature. If lab results are abnormal the clinic will contact you with the appropriate steps. If the clinic does not contact you assume the results are satisfactory. You can always view your results on My Chart. If you have questions regarding your health or results, please contact the clinic during office hours. You can also ask questions on My Chart.  We at [facility] are grateful that you chose us  to provide your care. We strive to provide evidence-based and compassionate care and are always looking for feedback. If you get a survey from the clinic please complete this so we can hear your opinions.  Diabetes Mellitus and Foot Care Diabetes, also called diabetes mellitus, may cause problems with your feet and legs because of poor blood flow (circulation). Poor circulation may make your skin: Become thinner and drier. Break more easily. Heal more slowly. Peel and crack. You may also have nerve damage (neuropathy). This can cause decreased  feeling in your legs and feet. This means that you may not notice minor injuries to your feet that could lead to more serious problems. Finding and treating problems early is the best way to prevent future foot problems. How to care for your feet Foot hygiene  Wash your feet daily with warm water  and mild soap. Do not use hot water . Then, pat your feet and the areas between your toes until they are fully dry. Do not soak your feet. This can dry your skin. Trim your toenails straight across. Do not dig under them or around the cuticle. File the edges of your nails with an emery board or nail file. Apply a moisturizing lotion or petroleum jelly to the skin on your feet and to dry, brittle toenails. Use lotion that does not contain alcohol and is unscented. Do not apply lotion between your toes. Shoes and socks Wear clean socks or stockings every day. Make sure they are not too tight. Do not wear knee-high stockings. These may decrease blood flow to your legs. Wear shoes that fit well and have enough cushioning. Always look in your shoes before you put them on to be sure there are no objects inside. To break in new shoes, wear them for just a few hours a day. This prevents injuries on your feet. Wounds, scrapes, corns, and calluses  Check your feet daily for blisters, cuts, bruises, sores, and redness. If you cannot see the bottom of your feet, use a mirror or ask someone for help. Do not cut off corns or calluses or try to remove **Note De-Identified Gladue Obfuscation** them with medicine. If you find a minor scrape, cut, or break in the skin on your feet, keep it and the skin around it clean and dry. You may clean these areas with mild soap and water . Do not clean the area with peroxide, alcohol, or iodine. If you have a wound, scrape, corn, or callus on your foot, look at it several times a day to make sure it is healing and not infected. Check for: Redness, swelling, or pain. Fluid or blood. Warmth. Pus or a bad smell. General  tips Do not cross your legs. This may decrease blood flow to your feet. Do not use heating pads or hot water  bottles on your feet. They may burn your skin. If you have lost feeling in your feet or legs, you may not know this is happening until it is too late. Protect your feet from hot and cold by wearing shoes, such as at the beach or on hot pavement. Schedule a complete foot exam at least once a year or more often if you have foot problems. Report any cuts, sores, or bruises to your health care provider right away. Where to find more information American Diabetes Association: diabetes.org Association of Diabetes Care & Education Specialists: diabeteseducator.org Contact a health care provider if: You have a condition that increases your risk of infection, and you have any cuts, sores, or bruises on your feet. You have an injury that is not healing. You have redness on your legs or feet. You feel burning or tingling in your legs or feet. You have pain or cramps in your legs and feet. Your legs or feet are numb. Your feet always feel cold. You have pain around any toenails. Get help right away if: You have a wound, scrape, corn, or callus on your foot and: You have signs of infection. You have a fever. You have a red line going up your leg. This information is not intended to replace advice given to you by your health care provider. Make sure you discuss any questions you have with your health care provider. Document Revised: 02/01/2022 Document Reviewed: 02/01/2022 Elsevier Patient Education  2024 [employer].

## 2023-08-22 DIAGNOSIS — M2242 Chondromalacia patellae, left knee: Secondary | ICD-10-CM | POA: Diagnosis not present

## 2023-08-23 ENCOUNTER — Ambulatory Visit (INDEPENDENT_AMBULATORY_CARE_PROVIDER_SITE_OTHER): Payer: Medicare Other | Admitting: Nurse Practitioner

## 2023-08-23 ENCOUNTER — Encounter: Payer: Self-pay | Admitting: Nurse Practitioner

## 2023-08-23 DIAGNOSIS — I700 Atherosclerosis of aorta: Secondary | ICD-10-CM

## 2023-08-23 DIAGNOSIS — E66811 Obesity, class 1: Secondary | ICD-10-CM

## 2023-08-23 DIAGNOSIS — E1159 Type 2 diabetes mellitus with other circulatory complications: Secondary | ICD-10-CM | POA: Diagnosis not present

## 2023-08-23 DIAGNOSIS — M2242 Chondromalacia patellae, left knee: Secondary | ICD-10-CM | POA: Diagnosis not present

## 2023-08-23 DIAGNOSIS — E6609 Other obesity due to excess calories: Secondary | ICD-10-CM

## 2023-08-23 DIAGNOSIS — E1169 Type 2 diabetes mellitus with other specified complication: Secondary | ICD-10-CM

## 2023-08-23 DIAGNOSIS — I152 Hypertension secondary to endocrine disorders: Secondary | ICD-10-CM | POA: Diagnosis not present

## 2023-08-23 DIAGNOSIS — E785 Hyperlipidemia, unspecified: Secondary | ICD-10-CM | POA: Diagnosis not present

## 2023-08-23 DIAGNOSIS — Z6831 Body mass index (BMI) 31.0-31.9, adult: Secondary | ICD-10-CM

## 2023-08-23 LAB — BAYER DCA HB A1C WAIVED: HB A1C (BAYER DCA - WAIVED): 7.5 % — ABNORMAL HIGH (ref 4.8–5.6)

## 2023-08-23 NOTE — Assessment & Plan Note (Addendum)
**Note De-Identified Jaime Obfuscation** Chronic, ongoing with A1c 7.5% today, slight trend up over holidays.  Metformin  caused rash.  Obtain Urine ALB next visit. - Continue off Metformin  due to side effect.  Could consider SGLT2 or Januvia if ongoing elevations in future.  She prefers to avoid injectables. Discussed with her goal is A1c <8% per the American Geriatric Society guidelines.  She prefers not to start medication at this time. - Continue Olmesartan  for kidney protection with urine ALB 22 October 2022, some improvement from previous of 30 and 80 in past.  - Continue to monitor BS at home daily and document + focus on diet.   - Refuses vaccinations. - Eye and foot exam up to date. - Recommend she trial Nervive OTC for occasional neuropathy noted, provided samples today to trial. - Return in 3 months.

## 2023-08-23 NOTE — Progress Notes (Signed)
**Note De-Identified Minteer Obfuscation** BP 118/76 (BP Location: Right Arm, Patient Position: Sitting, Cuff Size: Large)   Pulse 84   Temp (!) 97.4 F (36.3 C) (Oral)   Ht 5' 0.5 (1.537 m)   Wt 161 lb 6.4 oz (73.2 kg)   LMP  (LMP Unknown)   SpO2 98%   BMI 31.00 kg/m    Subjective:    Patient ID: Suzanne Jennings, female    DOB: 11/14/1950, 73 y.o.   MRN: [id number]  HPI: Suzanne Jennings is a 73 y.o. female  Chief Complaint  Patient presents with   Diabetes   Hypertension   Hyperlipidemia   DIABETES A1c 7.4% in July 2024.  Has not been doing good with diet over the holidays.  She is using elliptical at home and going to PT for left knee issues.  Has occasional tingling to feet at bedtime.  Took Metformin  in past but had allergic reaction to this.  She has been focused on diet and activity.  Prefers not to restart medication of any kind.   Hypoglycemic episodes:no Polydipsia/polyuria: no Visual disturbance: no Chest pain: no Paresthesias: no Glucose Monitoring: yes             Accucheck frequency: BID             Fasting glucose: 121 to 125 -sometimes lower and sometimes higher             Post prandial:             Evening:             Before meals: Taking Insulin?: no             Long acting insulin:             Short acting insulin: Blood Pressure Monitoring: daily Retinal Examination: Up to Date  Foot Exam: Up to Date Pneumovax: refused Influenza: refused Aspirin : yes    HYPERTENSION / HYPERLIPIDEMIA Taking Atorvastatin , Olmesartan , + ASA.  CT scan 2019 noted aortic atherosclerosis. Satisfied with current treatment? yes Duration of hypertension: chronic BP monitoring frequency: daily BP range: 120-130/70 range on average at home BP medication side effects: no Duration of hyperlipidemia: chronic Cholesterol medication side effects: no Cholesterol supplements: none Medication compliance: good compliance Aspirin : yes Recent stressors: no Recurrent headaches: no Visual changes: no Palpitations:  no Dyspnea: no Chest pain: no Lower extremity edema: no Dizzy/lightheaded: no    Relevant past medical, surgical, family and social history reviewed and updated as indicated. Interim medical history since our last visit reviewed. Allergies and medications reviewed and updated.  Review of Systems  Constitutional:  Negative for activity change, appetite change, diaphoresis, fatigue and fever.  Respiratory:  Negative for cough, chest tightness and shortness of breath.   Cardiovascular:  Negative for chest pain, palpitations and leg swelling.  Gastrointestinal: Negative.   Endocrine: Negative for cold intolerance, heat intolerance, polydipsia, polyphagia and polyuria.  Neurological: Negative.   Psychiatric/Behavioral: Negative.     Per HPI unless specifically indicated above     Objective:    BP 118/76 (BP Location: Right Arm, Patient Position: Sitting, Cuff Size: Large)   Pulse 84   Temp (!) 97.4 F (36.3 C) (Oral)   Ht 5' 0.5 (1.537 m)   Wt 161 lb 6.4 oz (73.2 kg)   LMP  (LMP Unknown)   SpO2 98%   BMI 31.00 kg/m   Wt Readings from Last 3 Encounters:  08/23/23 161 lb 6.4 oz (73.2 kg)  08/13/23 157 lb ( **Note De-Identified Teutsch Obfuscation** 71.2 kg)  03/26/23 156 lb 12.8 oz (71.1 kg)    Physical Exam Vitals and nursing note reviewed.  Constitutional:      General: She is awake. She is not in acute distress.    Appearance: She is well-developed and well-groomed. She is obese. She is not ill-appearing.  HENT:     Head: Normocephalic.     Right Ear: Hearing normal.     Left Ear: Hearing normal.  Eyes:     General: Lids are normal.        Right eye: No discharge.        Left eye: No discharge.     Conjunctiva/sclera: Conjunctivae normal.     Pupils: Pupils are equal, round, and reactive to light.  Neck:     Thyroid : No thyromegaly.     Vascular: No carotid bruit.  Cardiovascular:     Rate and Rhythm: Normal rate and regular rhythm.     Heart sounds: Normal heart sounds. No murmur heard.    No gallop.   Pulmonary:     Effort: Pulmonary effort is normal. No accessory muscle usage or respiratory distress.     Breath sounds: Normal breath sounds.  Abdominal:     General: Bowel sounds are normal. There is no distension.     Palpations: Abdomen is soft.     Tenderness: There is no abdominal tenderness. There is no right CVA tenderness or left CVA tenderness.  Musculoskeletal:     Cervical back: Normal range of motion and neck supple.     Right lower leg: No edema.     Left lower leg: No edema.  Skin:    General: Skin is warm and dry.  Neurological:     Mental Status: She is alert and oriented to person, place, and time.  Psychiatric:        Attention and Perception: Attention normal.        Mood and Affect: Mood normal.        Speech: Speech normal.        Behavior: Behavior normal. Behavior is cooperative.        Thought Content: Thought content normal.    Diabetic Foot Exam - Simple   Simple Foot Form Visual Inspection No deformities, no ulcerations, no other skin breakdown bilaterally: Yes Sensation Testing Intact to touch and monofilament testing bilaterally: Yes Pulse Check Posterior Tibialis and Dorsalis pulse intact bilaterally: Yes Comments     Results for orders placed or performed in visit on 02/13/23  Bayer DCA Hb A1c Waived   Collection Time: 02/13/23  9:14 AM  Result Value Ref Range   HB A1C (BAYER DCA - WAIVED) 7.4 (H) 4.8 - 5.6 %  Comprehensive metabolic panel   Collection Time: 02/13/23  9:15 AM  Result Value Ref Range   Glucose 120 (H) 70 - 99 mg/dL   BUN 16 8 - 27 mg/dL   Creatinine, Ser 9.14 0.57 - 1.00 mg/dL   eGFR 72 >40 fO/fpw/8.26   BUN/Creatinine Ratio 19 12 - 28   Sodium 138 134 - 144 mmol/L   Potassium 5.1 3.5 - 5.2 mmol/L   Chloride 102 96 - 106 mmol/L   CO2 22 20 - 29 mmol/L   Calcium  9.6 8.7 - 10.3 mg/dL   Total Protein 7.1 6.0 - 8.5 g/dL   Albumin 4.5 3.8 - 4.8 g/dL   Globulin, Total 2.6 1.5 - 4.5 g/dL   Bilirubin Total 0.6 0.0 - 1.2  mg/dL   Alkaline **Note De-Identified Karan Obfuscation** Phosphatase 103 44 - 121 IU/L   AST 22 0 - 40 IU/L   ALT 28 0 - 32 IU/L  Lipid Panel w/o Chol/HDL Ratio   Collection Time: 02/13/23  9:15 AM  Result Value Ref Range   Cholesterol, Total 120 100 - 199 mg/dL   Triglycerides 839 (H) 0 - 149 mg/dL   HDL 33 (L) >60 mg/dL   VLDL Cholesterol Cal 27 5 - 40 mg/dL   LDL Chol Calc (NIH) 60 0 - 99 mg/dL      Assessment & Plan:   Problem List Items Addressed This Visit       Cardiovascular and Mediastinum   Aortic atherosclerosis (HCC)   Ongoing and stable.  Noted on CT 06/03/18.  Continue daily statin and ASA for prevention.      Hypertension associated with diabetes (HCC)   Chronic, stable.  BP at goal for age on recheck and at goal at home.  Discussed with her goal BP <130/80.  Continue Olmesartan  at this time and adjust dose as needed, is tolerating well without ADR -- offers kidney protection with her diabetes.  Recommend she continue to monitor BP at home daily and document for provider + focus on DASH diet.  LABS: CMP.  Urine ALB 22 October 2022, recheck next visit      Relevant Orders   Bayer DCA Hb A1c Waived     Endocrine   Hyperlipidemia associated with type 2 diabetes mellitus (HCC)   Chronic, ongoing.  Continue current medication regimen and adjust as needed.  Lipid panel today.      Relevant Orders   Bayer DCA Hb A1c Waived   Lipid Panel w/o Chol/HDL Ratio   Comprehensive metabolic panel   Type 2 diabetes mellitus with morbid obesity (HCC) - Primary   Chronic, ongoing with A1c 7.5% today, slight trend up over holidays.  Metformin  caused rash.  Obtain Urine ALB next visit. - Continue off Metformin  due to side effect.  Could consider SGLT2 or Januvia if ongoing elevations in future.  She prefers to avoid injectables. Discussed with her goal is A1c <8% per the American Geriatric Society guidelines.  She prefers not to start medication at this time. - Continue Olmesartan  for kidney protection with urine ALB 22 October 2022, some improvement from previous of 30 and 80 in past.  - Continue to monitor BS at home daily and document + focus on diet.   - Refuses vaccinations. - Eye and foot exam up to date. - Recommend she trial Nervive OTC for occasional neuropathy noted, provided samples today to trial. - Return in 3 months.      Relevant Orders   Bayer DCA Hb A1c Waived     Other   Obesity   BMI 31.00, a little trend up from previous visit.  Recommended eating smaller high protein, low fat meals more frequently and exercising 30 mins a day 5 times a week with a goal of 10-15lb weight loss in the next 3 months. Patient voiced their understanding and motivation to adhere to these recommendations.         Follow up plan: Return in about 3 months (around 11/21/2023) for Annual Physical -- diabetes check too.

## 2023-08-23 NOTE — Assessment & Plan Note (Signed)
**Note De-identified [place] Obfuscation** Ongoing and stable.  Noted on CT 06/03/18.  Continue daily statin and ASA for prevention. 

## 2023-08-23 NOTE — Assessment & Plan Note (Signed)
**Note De-Identified Hast Obfuscation** Chronic, stable.  BP at goal for age on recheck and at goal at home.  Discussed with her goal BP <130/80.  Continue Olmesartan  at this time and adjust dose as needed, is tolerating well without ADR -- offers kidney protection with her diabetes.  Recommend she continue to monitor BP at home daily and document for provider + focus on DASH diet.  LABS: CMP.  Urine ALB 22 October 2022, recheck next visit

## 2023-08-23 NOTE — Assessment & Plan Note (Signed)
**Note De-Identified Strang Obfuscation** BMI 31.00, a little trend up from previous visit.  Recommended eating smaller high protein, low fat meals more frequently and exercising 30 mins a day 5 times a week with a goal of 10-15lb weight loss in the next 3 months. Patient voiced their understanding and motivation to adhere to these recommendations.

## 2023-08-23 NOTE — Assessment & Plan Note (Signed)
 Chronic, ongoing.  Continue current medication regimen and adjust as needed. Lipid panel today.

## 2023-08-24 LAB — COMPREHENSIVE METABOLIC PANEL
ALT: 39 [IU]/L — ABNORMAL HIGH (ref 0–32)
AST: 32 [IU]/L (ref 0–40)
Albumin: 4.6 g/dL (ref 3.8–4.8)
Alkaline Phosphatase: 89 [IU]/L (ref 44–121)
BUN/Creatinine Ratio: 15 (ref 12–28)
BUN: 12 mg/dL (ref 8–27)
Bilirubin Total: 0.8 mg/dL (ref 0.0–1.2)
CO2: 22 mmol/L (ref 20–29)
Calcium: 9.7 mg/dL (ref 8.7–10.3)
Chloride: 100 mmol/L (ref 96–106)
Creatinine, Ser: 0.81 mg/dL (ref 0.57–1.00)
Globulin, Total: 2.7 g/dL (ref 1.5–4.5)
Glucose: 139 mg/dL — ABNORMAL HIGH (ref 70–99)
Potassium: 4.3 mmol/L (ref 3.5–5.2)
Sodium: 141 mmol/L (ref 134–144)
Total Protein: 7.3 g/dL (ref 6.0–8.5)
eGFR: 76 mL/min/{1.73_m2} (ref >59)

## 2023-08-24 LAB — LIPID PANEL W/O CHOL/HDL RATIO
Cholesterol, Total: 154 mg/dL (ref 100–199)
HDL: 37 mg/dL — ABNORMAL LOW (ref >39)
LDL Chol Calc (NIH): 78 mg/dL (ref 0–99)
Triglycerides: 232 mg/dL — ABNORMAL HIGH (ref 0–149)
VLDL Cholesterol Cal: 39 mg/dL (ref 5–40)

## 2023-08-24 NOTE — Progress Notes (Signed)
**Note De-Identified Decuir Obfuscation** Good morning, please let Suzanne Jennings know her labs have returned:  Good morning Suzanne Jennings, your labs have returned: - Triglycerides are a little bit up, but LDL remains stable although trended up a little from previous of 60.  Now 78.  I know you prefer no statin therapy, so continue diet and exercise focus. - Kidney and liver function are overall stable.  Any questions? Off to do my [place] snow dance now.:) Keep being amazing!!  Thank you for allowing me to participate in your care.  I appreciate you. Kindest regards, Edword Cu

## 2023-08-27 DIAGNOSIS — M2242 Chondromalacia patellae, left knee: Secondary | ICD-10-CM | POA: Diagnosis not present

## 2023-08-28 ENCOUNTER — Encounter: Payer: Self-pay | Admitting: *Deleted

## 2023-08-30 DIAGNOSIS — M2242 Chondromalacia patellae, left knee: Secondary | ICD-10-CM | POA: Diagnosis not present

## 2023-09-06 DIAGNOSIS — M2242 Chondromalacia patellae, left knee: Secondary | ICD-10-CM | POA: Diagnosis not present

## 2023-09-10 DIAGNOSIS — M2242 Chondromalacia patellae, left knee: Secondary | ICD-10-CM | POA: Diagnosis not present

## 2023-09-13 DIAGNOSIS — M2242 Chondromalacia patellae, left knee: Secondary | ICD-10-CM | POA: Diagnosis not present

## 2023-09-17 ENCOUNTER — Other Ambulatory Visit: Payer: Self-pay | Admitting: Nurse Practitioner

## 2023-09-19 DIAGNOSIS — E113293 Type 2 diabetes mellitus with mild nonproliferative diabetic retinopathy without macular edema, bilateral: Secondary | ICD-10-CM | POA: Diagnosis not present

## 2023-09-19 DIAGNOSIS — H2513 Age-related nuclear cataract, bilateral: Secondary | ICD-10-CM | POA: Diagnosis not present

## 2023-09-19 LAB — HM DIABETES EYE EXAM

## 2023-09-19 NOTE — Telephone Encounter (Signed)
**Note De-Identified Rosselli Obfuscation** Labs in date  Requested Prescriptions  Pending Prescriptions Disp Refills   atorvastatin  (LIPITOR) 80 MG tablet [Pharmacy Med Name: Atorvastatin  Calcium  80 MG Oral Tablet] 100 tablet 1    Sig: TAKE 1 TABLET BY MOUTH ONCE  DAILY     Cardiovascular:  Antilipid - Statins Failed - 09/19/2023 11:08 AM      Failed - Lipid Panel in normal range within the last 12 months    Cholesterol, Total  Date Value Ref Range Status  08/23/2023 154 100 - 199 mg/dL Final   Cholesterol Piccolo, Waived  Date Value Ref Range Status  06/29/2017 169 <200 mg/dL Final    Comment:                            Desirable                <200                         Borderline High      200- 239                         High                     >239    LDL Chol Calc (NIH)  Date Value Ref Range Status  08/23/2023 78 0 - 99 mg/dL Final   HDL  Date Value Ref Range Status  08/23/2023 37 (L) >39 mg/dL Final   Triglycerides  Date Value Ref Range Status  08/23/2023 232 (H) 0 - 149 mg/dL Final   Triglycerides Piccolo,Waived  Date Value Ref Range Status  06/29/2017 179 (H) <150 mg/dL Final    Comment:                            Normal                   <150                         Borderline High     150 - 199                         High                200 - 499                         Very High                >499          Passed - Patient is not pregnant      Passed - Valid encounter within last 12 months    Recent Outpatient Visits           3 weeks ago Type 2 diabetes mellitus with morbid obesity (HCC)   [employer] [facility] [place], Warrenton T, NP   5 months ago Acute pain of left knee   [place] [facility] [place], Hyla Givens, NP   7 months ago Type 2 diabetes mellitus with morbid obesity (HCC)    [facility] [place], Bisbee T, NP   10 months ago Type 2 diabetes mellitus with morbid obesity ( **Note De-Identified Alfonzo Obfuscation** HCC)   [place] [facility]  [place], Melanie T, NP   1 year ago Medicare annual wellness visit, subsequent   [place] [facility] [place], Melanie DASEN, NP       Future Appointments             In 2 months Cannady, Jolene T, NP [facility] [facility]

## 2023-09-27 ENCOUNTER — Other Ambulatory Visit: Payer: Self-pay | Admitting: Nurse Practitioner

## 2023-09-28 NOTE — Telephone Encounter (Signed)
**Note De-Identified Haughn Obfuscation** Requested Prescriptions  Pending Prescriptions Disp Refills   olmesartan (BENICAR) 20 MG tablet [Pharmacy Med Name: Olmesartan Medoxomil 20 MG Oral Tablet] 100 tablet 1    Sig: TAKE 1 TABLET BY MOUTH DAILY     Cardiovascular:  Angiotensin Receptor Blockers Passed - 09/28/2023  2:45 PM      Passed - Cr in normal range and within 180 days    Creatinine, Ser  Date Value Ref Range Status  08/23/2023 0.81 0.57 - 1.00 mg/dL Final         Passed - K in normal range and within 180 days    Potassium  Date Value Ref Range Status  08/23/2023 4.3 3.5 - 5.2 mmol/L Final         Passed - Patient is not pregnant      Passed - Last BP in normal range    BP Readings from Last 1 Encounters:  08/23/23 118/76         Passed - Valid encounter within last 6 months    Recent Outpatient Visits           1 month ago Type 2 diabetes mellitus with morbid obesity (HCC)   [place] [facility] [place], Corrie Dandy T, NP   6 months ago Acute pain of left knee   [place] [facility] [place], Sherran Needs, NP   7 months ago Type 2 diabetes mellitus with morbid obesity (HCC)   [place] [facility] [place], Spicer T, NP   10 months ago Type 2 diabetes mellitus with morbid obesity (HCC)   [place] [facility] [place], Corrie Dandy T, NP   1 year ago Medicare annual wellness visit, subsequent   [place] [facility] [place], Dorie Rank, NP       Future Appointments             In 1 month Cannady, Dorie Rank, NP  [facility]

## 2023-11-01 ENCOUNTER — Ambulatory Visit: Payer: Self-pay | Admitting: Nurse Practitioner

## 2023-11-01 NOTE — Telephone Encounter (Signed)
**Note De-identified Lean Obfuscation**  **Note De-Identified Lusty Obfuscation** Chief Complaint: Fall - also has URI Symptoms: Larey Seat on face  and hurt her nose. Knee is raw from trying to get up. Also has URI - wet cough Frequency: Fall was 2 days ago. URI 1 week Pertinent Negatives: Patient denies Fever, dark urine. Disposition: [] ED /[] Urgent Care (no appt availability in office) / [x] Appointment(In office/virtual)/ []  [place] [facility]/ [] Home Care/ [] Refused Recommended Disposition /[] [place] [facility]/ []  Follow-up with PCP Additional Notes: Pt got tripped up by her dog 2 days ago in the kitchen and fell. Pt was on the floor for 2.5 hours until her husband came home to help her. Pt fell on her face and hit her nose. Pt states she was too weak to get back up. She thinks the weakness is d/t an URI she has had for 1 week. Pt has wet sounding cough, but does not bring up any phlegm. Pt denies, chest pain, SOB, difficulty breathing, fever, or dark urine. Pt has a scrape on knee from trying to get up off the floor. Scheduled OV for tomorrow morning. Pt will monitor s/s and call back if needed.    Copied from CRM [id number]. Topic: Clinical - Red Word Triage >> Nov 01, 2023  9:23 AM Turkey B wrote: [facility] that prompted transfer to Nurse Triage: pt had a fall 2 days ago, lost call, tried to cb no answer Reason for Disposition  [1] No prior tetanus shots (or is not fully vaccinated) AND [2] any wound (e.g., cut or scrape)  Answer Assessment - Initial Assessment Questions 1. MECHANISM: "How did the fall happen?"     Tripped over dog 2. DOMESTIC VIOLENCE AND ELDER ABUSE SCREENING: "Did you fall because someone pushed you or tried to hurt you?" If Yes, ask: "Are you safe now?"     no 3. ONSET: "When did the fall happen?" (e.g., minutes, hours, or days ago)     2 days ago 4. LOCATION: "What part of the body hit the ground?" (e.g., back, buttocks, head, hips, knees, hands, head, stomach)     face 5. INJURY: "Did you hurt (injure) yourself when you fell?" If Yes,  ask: "What did you injure? Tell me more about this?" (e.g., body area; type of injury; pain severity)"     Nose bled 6. PAIN: "Is there any pain?" If Yes, ask: "How bad is the pain?" (e.g., Scale 1-10; or mild,  moderate, severe)   - NONE (0): No pain   - MILD (1-3): Doesn't interfere with normal activities    - MODERATE (4-7): Interferes with normal activities or awakens from sleep    - SEVERE (8-10): Excruciating pain, unable to do any normal activities      moderate 7. SIZE: For cuts, bruises, or swelling, ask: "How large is it?" (e.g., inches or centimeters)      Skinned nose  9. OTHER SYMPTOMS: "Do you have any other symptoms?" (e.g., dizziness, fever, weakness; new onset or worsening).      no 10. CAUSE: "What do you think caused the fall (or falling)?" (e.g., tripped, dizzy spell)       Tripped  Protocols used: Falls and [facility]

## 2023-11-02 ENCOUNTER — Encounter: Payer: Self-pay | Admitting: Nurse Practitioner

## 2023-11-02 ENCOUNTER — Ambulatory Visit: Admitting: Nurse Practitioner

## 2023-11-02 VITALS — BP 123/75 | HR 85 | Temp 98.0°F | Ht 60.5 in | Wt 152.4 lb

## 2023-11-02 DIAGNOSIS — W19XXXA Unspecified fall, initial encounter: Secondary | ICD-10-CM | POA: Diagnosis not present

## 2023-11-02 DIAGNOSIS — S0993XA Unspecified injury of face, initial encounter: Secondary | ICD-10-CM | POA: Diagnosis not present

## 2023-11-02 DIAGNOSIS — J111 Influenza due to unidentified influenza virus with other respiratory manifestations: Secondary | ICD-10-CM | POA: Insufficient documentation

## 2023-11-02 NOTE — Assessment & Plan Note (Signed)
**Note De-Identified Wivell Obfuscation** On 10/31/23, overall neuro exam reassuring today and no pain to nose.  Will obtain facial CT to further assess, she would prefer this.  Monitor closely.  Fall was accidental due to tripping over her dog.  Refer to influenza plan of care for further.

## 2023-11-02 NOTE — Assessment & Plan Note (Signed)
**Note De-Identified Mcphearson Obfuscation** To nasal area.  On 10/31/23 fall, overall neuro exam reassuring today and no pain to nose.  Will obtain facial CT to further assess, she would prefer this.  Monitor closely.  Fall was accidental due to tripping over her dog.  Refer to influenza plan of care for further.

## 2023-11-02 NOTE — Patient Instructions (Signed)
 Fall Prevention in the Home, Adult Falls can cause injuries and can happen to people of all ages. There are many things you can do to make your home safer and to help prevent falls. What actions can I take to prevent falls? General information Use good lighting in all rooms. Make sure to: Replace any light bulbs that burn out. Turn on the lights in dark areas and use night-lights. Keep items that you use often in easy-to-reach places. Lower the shelves around your home if needed. Move furniture so that there are clear paths around it. Do not use throw rugs or other things on the floor that can make you trip. If any of your floors are uneven, fix them. Add color or contrast paint or tape to clearly mark and help you see: Grab bars or handrails. First and last steps of staircases. Where the edge of each step is. If you use a ladder or stepladder: Make sure that it is fully opened. Do not climb a closed ladder. Make sure the sides of the ladder are locked in place. Have someone hold the ladder while you use it. Know where your pets are as you move through your home. What can I do in the bathroom?     Keep the floor dry. Clean up any water on the floor right away. Remove soap buildup in the bathtub or shower. Buildup makes bathtubs and showers slippery. Use non-skid mats or decals on the floor of the bathtub or shower. Attach bath mats securely with double-sided, non-slip rug tape. If you need to sit down in the shower, use a non-slip stool. Install grab bars by the toilet and in the bathtub and shower. Do not use towel bars as grab bars. What can I do in the bedroom? Make sure that you have a light by your bed that is easy to reach. Do not use any sheets or blankets on your bed that hang to the floor. Have a firm chair or bench with side arms that you can use for support when you get dressed. What can I do in the kitchen? Clean up any spills right away. If you need to reach something  above you, use a step stool with a grab bar. Keep electrical cords out of the way. Do not use floor polish or wax that makes floors slippery. What can I do with my stairs? Do not leave anything on the stairs. Make sure that you have a light switch at the top and the bottom of the stairs. Make sure that there are handrails on both sides of the stairs. Fix handrails that are broken or loose. Install non-slip stair treads on all your stairs if they do not have carpet. Avoid having throw rugs at the top or bottom of the stairs. Choose a carpet that does not hide the edge of the steps on the stairs. Make sure that the carpet is firmly attached to the stairs. Fix carpet that is loose or worn. What can I do on the outside of my home? Use bright outdoor lighting. Fix the edges of walkways and driveways and fix any cracks. Clear paths of anything that can make you trip, such as tools or rocks. Add color or contrast paint or tape to clearly mark and help you see anything that might make you trip as you walk through a door, such as a raised step or threshold. Trim any bushes or trees on paths to your home. Check to see if handrails are loose  or broken and that both sides of all steps have handrails. Install guardrails along the edges of any raised decks and porches. Have leaves, snow, or ice cleared regularly. Use sand, salt, or ice melter on paths if you live where there is ice and snow during the winter. Clean up any spills in your garage right away. This includes grease or oil spills. What other actions can I take? Review your medicines with your doctor. Some medicines can cause dizziness or changes in blood pressure, which increase your risk of falling. Wear shoes that: Have a low heel. Do not wear high heels. Have rubber bottoms and are closed at the toe. Feel good on your feet and fit well. Use tools that help you move around if needed. These include: Canes. Walkers. Scooters. Crutches. Ask  your doctor what else you can do to help prevent falls. This may include seeing a physical therapist to learn to do exercises to move better and get stronger. Where to find more information Centers for Disease Control and Prevention, STEADI: TonerPromos.no [employer] on Aging: BaseRingTones.pl National Institute on Aging: BaseRingTones.pl Contact a doctor if: You are afraid of falling at home. You feel weak, drowsy, or dizzy at home. You fall at home. Get help right away if you: Lose consciousness or have trouble moving after a fall. Have a fall that causes a head injury. These symptoms may be an emergency. Get help right away. Call 911. Do not wait to see if the symptoms will go away. Do not drive yourself to the hospital. This information is not intended to replace advice given to you by your health care provider. Make sure you discuss any questions you have with your health care provider. Document Revised: 04/03/2022 Document Reviewed: 04/03/2022 Elsevier Patient Education  2024 [employer].

## 2023-11-02 NOTE — Progress Notes (Signed)
**Note De-Identified Elsen Obfuscation** BP 123/75 (BP Location: Left Arm, Cuff Size: Normal)   Pulse 85   Temp 98 F (36.7 C) (Oral)   Ht 5' 0.5" (1.537 m)   Wt 152 lb 6.4 oz (69.1 kg)   LMP  (LMP Unknown)   SpO2 94%   BMI 29.27 kg/m    Subjective:    Patient ID: Suzanne Jennings, female    DOB: 1951/07/07, 73 y.o.   MRN: [id number]  HPI: Suzanne Jennings is a 73 y.o. female  Chief Complaint  Patient presents with   Fall    Patient states she had a fall on Tuesday after tripping over her dog. States she did hit her face on the floor. States she not really in a lot of pain, more achy all over from the fall.    FALL WITH INJURY Had the flu one week ago, then this Tuesday she fell after tripping over her dog. She was feeling okay at the time, no dizziness or syncope.  Dog bumped against her knee and she fell.  When fell she landed on her face with abrasions.  Has abrasion to right knee from trying to get up.  Stayed on the floor, her husband came home and helped her up.  Feels weak all over now.  No recent nose bleeds, had one during the flu.  Her taste buds are all out of whack since the flu, appetite has been reduced due to this.  Had diarrhea with the flu and is trying to rehydrate. Nausea:  no Vomiting: no Photophobia:  no Phonophobia:  no SOB: no Chest pain: no Confusion:  no Gait disturbance/ataxia:  no Behavioral changes:  no Fevers:  no  Weakness: yes Dizziness: no  Relevant past medical, surgical, family and social history reviewed and updated as indicated. Interim medical history since our last visit reviewed. Allergies and medications reviewed and updated.  Review of Systems  Constitutional:  Positive for appetite change and fatigue. Negative for activity change, chills, fever and unexpected weight change (did lose weight after being sick).  Eyes:  Negative for visual disturbance.  Respiratory:  Negative for cough, chest tightness, shortness of breath and wheezing.   Cardiovascular:  Negative for chest pain,  palpitations and leg swelling.  Musculoskeletal:  Positive for arthralgias (arms, lower back, legs).  Neurological:  Positive for weakness. Negative for dizziness, syncope, facial asymmetry, speech difficulty, light-headedness, numbness and headaches.  Psychiatric/Behavioral: Negative.      Per HPI unless specifically indicated above     Objective:    BP 123/75 (BP Location: Left Arm, Cuff Size: Normal)   Pulse 85   Temp 98 F (36.7 C) (Oral)   Ht 5' 0.5" (1.537 m)   Wt 152 lb 6.4 oz (69.1 kg)   LMP  (LMP Unknown)   SpO2 94%   BMI 29.27 kg/m   Wt Readings from Last 3 Encounters:  11/02/23 152 lb 6.4 oz (69.1 kg)  08/23/23 161 lb 6.4 oz (73.2 kg)  08/13/23 157 lb (71.2 kg)    Physical Exam Vitals and nursing note reviewed.  Constitutional:      General: She is awake. She is not in acute distress.    Appearance: She is well-developed and well-groomed. She is obese. She is not ill-appearing or toxic-appearing.  HENT:     Head: Normocephalic.     Right Ear: Hearing and external ear normal.     Left Ear: Hearing and external ear normal.     Nose: Laceration (to upper **Note De-Identified Winiecki Obfuscation** nasal bridge, crusted over, no swelling) present. No nasal tenderness.     Right Sinus: No maxillary sinus tenderness or frontal sinus tenderness.     Left Sinus: No maxillary sinus tenderness or frontal sinus tenderness.     Mouth/Throat:     Mouth: Mucous membranes are moist.     Pharynx: Oropharynx is clear.  Eyes:     General: Lids are normal. No visual field deficit.       Right eye: No discharge.        Left eye: No discharge.     Extraocular Movements: Extraocular movements intact.     Conjunctiva/sclera: Conjunctivae normal.     Pupils: Pupils are equal, round, and reactive to light.     Visual Fields: Right eye visual fields normal and left eye visual fields normal.  Neck:     Thyroid: No thyromegaly.     Vascular: No carotid bruit.  Cardiovascular:     Rate and Rhythm: Normal rate and regular  rhythm.     Heart sounds: Normal heart sounds. No murmur heard.    No gallop.  Pulmonary:     Effort: Pulmonary effort is normal. No accessory muscle usage or respiratory distress.     Breath sounds: Normal breath sounds. No decreased breath sounds, wheezing or rales.  Abdominal:     General: Bowel sounds are normal. There is no distension.     Palpations: Abdomen is soft.     Tenderness: There is no abdominal tenderness.  Musculoskeletal:     Cervical back: Normal range of motion and neck supple.     Right knee: Laceration (to lateral knee, crusted over abrasion) present. No swelling or erythema.     Left knee: Normal.     Right lower leg: No edema.     Left lower leg: No edema.  Lymphadenopathy:     Cervical: No cervical adenopathy.  Skin:    General: Skin is warm and dry.  Neurological:     Mental Status: She is alert and oriented to person, place, and time.     Cranial Nerves: Cranial nerves 2-12 are intact. No cranial nerve deficit or facial asymmetry.     Motor: Motor function is intact.     Coordination: Coordination is intact.     Gait: Gait is intact.     Deep Tendon Reflexes: Reflexes are normal and symmetric.     Reflex Scores:      Brachioradialis reflexes are 2+ on the right side and 2+ on the left side.      Patellar reflexes are 2+ on the right side and 2+ on the left side. Psychiatric:        Attention and Perception: Attention normal.        Mood and Affect: Mood normal.        Speech: Speech normal.        Behavior: Behavior normal. Behavior is cooperative.        Thought Content: Thought content normal.    Results for orders placed or performed in visit on 11/02/23  [facility] DIABETES EYE EXAM   Collection Time: 09/19/23 10:23 AM  Result Value Ref Range   [facility] Diabetic Eye Exam Retinopathy (A) No Retinopathy      Assessment & Plan:   Problem List Items Addressed This Visit       Respiratory   Influenza - Primary   Acute, two weeks ago.  Overall has  recovered with exception of some ongoing reduced appetite, fatigue, **Note De-Identified Rozas Obfuscation** and weakness.  Obtain CBC and CMP today.  Recommend she ensure to continue increased hydration and add some electrolytes to water.  Liquid IV.  Determine need for further steps once labs return.      Relevant Orders   CBC with Differential/Platelet   Comprehensive metabolic panel     Other   Fall   On 10/31/23, overall neuro exam reassuring today and no pain to nose.  Will obtain facial CT to further assess, she would prefer this.  Monitor closely.  Fall was accidental due to tripping over her dog.  Refer to influenza plan of care for further.      Relevant Orders   CT Maxillofacial WO CM   Injury of face   To nasal area.  On 10/31/23 fall, overall neuro exam reassuring today and no pain to nose.  Will obtain facial CT to further assess, she would prefer this.  Monitor closely.  Fall was accidental due to tripping over her dog.  Refer to influenza plan of care for further.      Relevant Orders   CT Maxillofacial WO CM     Follow up plan: Return if symptoms worsen or fail to improve.

## 2023-11-02 NOTE — Assessment & Plan Note (Signed)
**Note De-Identified [place]os Obfuscation** Acute, two weeks ago.  Overall has recovered with exception of some ongoing reduced appetite, fatigue, and weakness.  Obtain CBC and CMP today.  Recommend she ensure to continue increased hydration and add some electrolytes to water.  Liquid IV.  Determine need for further steps once labs return.

## 2023-11-03 LAB — CBC WITH DIFFERENTIAL/PLATELET
Basophils Absolute: 0 10*3/uL (ref 0.0–0.2)
Basos: 1 %
EOS (ABSOLUTE): 0 10*3/uL (ref 0.0–0.4)
Eos: 0 %
Hematocrit: 37.8 % (ref 34.0–46.6)
Hemoglobin: 12.8 g/dL (ref 11.1–15.9)
Immature Grans (Abs): 0 10*3/uL (ref 0.0–0.1)
Immature Granulocytes: 0 %
Lymphocytes Absolute: 1.8 10*3/uL (ref 0.7–3.1)
Lymphs: 32 %
MCH: 32.4 pg (ref 26.6–33.0)
MCHC: 33.9 g/dL (ref 31.5–35.7)
MCV: 96 fL (ref 79–97)
Monocytes Absolute: 0.5 10*3/uL (ref 0.1–0.9)
Monocytes: 9 %
Neutrophils Absolute: 3.3 10*3/uL (ref 1.4–7.0)
Neutrophils: 58 %
Platelets: 252 10*3/uL (ref 150–450)
RBC: 3.95 x10E6/uL (ref 3.77–5.28)
RDW: 11.9 % (ref 11.7–15.4)
WBC: 5.7 10*3/uL (ref 3.4–10.8)

## 2023-11-03 LAB — COMPREHENSIVE METABOLIC PANEL
ALT: 28 IU/L (ref 0–32)
AST: 37 IU/L (ref 0–40)
Albumin: 4 g/dL (ref 3.8–4.8)
Alkaline Phosphatase: 59 IU/L (ref 44–121)
BUN/Creatinine Ratio: 12 (ref 12–28)
BUN: 8 mg/dL (ref 8–27)
Bilirubin Total: 0.9 mg/dL (ref 0.0–1.2)
CO2: 22 mmol/L (ref 20–29)
Calcium: 9.4 mg/dL (ref 8.7–10.3)
Chloride: 99 mmol/L (ref 96–106)
Creatinine, Ser: 0.65 mg/dL (ref 0.57–1.00)
Globulin, Total: 2.7 g/dL (ref 1.5–4.5)
Glucose: 148 mg/dL — ABNORMAL HIGH (ref 70–99)
Potassium: 3.6 mmol/L (ref 3.5–5.2)
Sodium: 138 mmol/L (ref 134–144)
Total Protein: 6.7 g/dL (ref 6.0–8.5)
eGFR: 92 mL/min/{1.73_m2} (ref >59)

## 2023-11-03 NOTE — Progress Notes (Signed)
**Note De-Identified Marzano Obfuscation** Good morning, please let Suzanne Jennings know her blood work returned and all is stable.  Good news!!

## 2023-11-05 ENCOUNTER — Ambulatory Visit
Admission: RE | Admit: 2023-11-05 | Discharge: 2023-11-05 | Disposition: A | Discharge status: Home / Self Care | Source: Ambulatory Visit | Attending: Nurse Practitioner | Admitting: Nurse Practitioner

## 2023-11-05 ENCOUNTER — Encounter: Payer: Self-pay | Admitting: Nurse Practitioner

## 2023-11-05 DIAGNOSIS — S0031XA Abrasion of nose, initial encounter: Secondary | ICD-10-CM | POA: Diagnosis not present

## 2023-11-05 DIAGNOSIS — W19XXXA Unspecified fall, initial encounter: Secondary | ICD-10-CM | POA: Diagnosis not present

## 2023-11-05 DIAGNOSIS — S0993XA Unspecified injury of face, initial encounter: Secondary | ICD-10-CM | POA: Insufficient documentation

## 2023-11-05 DIAGNOSIS — S0990XA Unspecified injury of head, initial encounter: Secondary | ICD-10-CM | POA: Diagnosis not present

## 2023-11-23 ENCOUNTER — Encounter: Payer: Self-pay | Admitting: Nurse Practitioner

## 2023-11-23 DIAGNOSIS — Z0000 Encounter for general adult medical examination without abnormal findings: Secondary | ICD-10-CM

## 2023-11-23 DIAGNOSIS — E1169 Type 2 diabetes mellitus with other specified complication: Secondary | ICD-10-CM

## 2023-11-23 DIAGNOSIS — Z8542 Personal history of malignant neoplasm of other parts of uterus: Secondary | ICD-10-CM

## 2023-11-23 DIAGNOSIS — E1159 Type 2 diabetes mellitus with other circulatory complications: Secondary | ICD-10-CM

## 2023-11-23 DIAGNOSIS — I700 Atherosclerosis of aorta: Secondary | ICD-10-CM

## 2023-11-23 DIAGNOSIS — E6609 Other obesity due to excess calories: Secondary | ICD-10-CM

## 2023-12-09 NOTE — Patient Instructions (Incomplete)
Be Involved in Caring For Your Health:  Taking Medications When medications are taken as directed, they can greatly improve your health. But if they are not taken as prescribed, they may not work. In some cases, not taking them correctly can be harmful. To help ensure your treatment remains effective and safe, understand your medications and how to take them. Bring your medications to each visit for review by your provider.  Your lab results, notes, and after visit summary will be available on My Chart. We strongly encourage you to use this feature. If lab results are abnormal the clinic will contact you with the appropriate steps. If the clinic does not contact you assume the results are satisfactory. You can always view your results on My Chart. If you have questions regarding your health or results, please contact the clinic during office hours. You can also ask questions on My Chart.  We at [facility] are grateful that you chose [place] to provide your care. We strive to provide evidence-based and compassionate care and are always looking for feedback. If you get a survey from the clinic please complete this so we can hear your opinions.  Diabetes Mellitus and Exercise Regular exercise is important for your health, especially if you have diabetes mellitus. Exercise is not just about losing weight. It can also help you increase muscle strength and bone density and reduce body fat and stress. This can help your level of endurance and make you more fit and flexible. Why should I exercise if I have diabetes? Exercise has many benefits for people with diabetes. It can: Help lower and control your blood sugar (glucose). Help your body respond better and become more sensitive to the hormone insulin. Reduce how much insulin your body needs. Lower your risk for heart disease by: Lowering how much "bad" cholesterol and triglycerides you have in your body. Increasing how much "good" cholesterol  you have in your body. Lowering your blood pressure. Lowering your blood glucose levels. What is my activity plan? Your health care provider or an expert trained in diabetes care (certified diabetes educator) can help you make an activity plan. This plan can help you find the type of exercise that works for you. It may also tell you how often to exercise and for how long. Be sure to: Get at least 150 minutes of medium-intensity or high-intensity exercise each week. This may involve brisk walking, biking, or water aerobics. Do stretching and strengthening exercises at least 2 times a week. This may involve yoga or weight lifting. Spread out your activity over at least 3 days of the week. Get some form of physical activity each day. Do not go more than 2 days in a row without some kind of activity. Avoid being inactive for more than 30 minutes at a time. Take frequent breaks to walk or stretch. Choose activities that you enjoy. Set goals that you know you can accomplish. Start slowly and increase the intensity of your exercise over time. How do I manage my diabetes during exercise?  Monitor your blood glucose Check your blood glucose before and after you exercise. If your blood glucose is 240 mg/dL (40.9 mmol/L) or higher before you exercise, check your urine for ketones. These are chemicals created by the liver. If you have ketones in your urine, do not exercise until your blood glucose returns to normal. If your blood glucose is 100 mg/dL (5.6 mmol/L) or lower, eat a snack that has 15-20 grams of carbohydrate in  it. Check your blood glucose 15 minutes after the snack to make sure that your level is above 100 mg/dL (5.6 mmol/L) before you start to exercise. Your risk for low blood glucose (hypoglycemia) goes up during and after exercise. Know the symptoms of this condition and how to treat it. Follow these instructions at home: Keep a carbohydrate snack on hand for use before, during, and after  exercise. This can help prevent or treat hypoglycemia. Avoid injecting insulin into parts of your body that are going to be used during exercise. This may include: Your arms, when you are going to play tennis. Your legs, when you are about to go jogging. Keep track of your exercise habits. This can help you and your health care provider watch and adjust your activity plan. Write down: What you eat before and after you exercise. Blood glucose levels before and after you exercise. The type and amount of exercise you do. Talk to your health care provider before you start a new activity. They may need to: Make sure that the activity is safe for you. Adjust your insulin, other medicines, and food that you eat. Drink water while you exercise. This can stop you from losing too much water (dehydration). It can also prevent problems caused by having a lot of heat in your body (heat stroke). Where to find more information American Diabetes Association: diabetes.org Association of Diabetes Care & Education Specialists: diabeteseducator.org This information is not intended to replace advice given to you by your health care provider. Make sure you discuss any questions you have with your health care provider. Document Revised: 01/18/2022 Document Reviewed: 01/18/2022 Elsevier Patient Education  2024 [employer].

## 2023-12-11 ENCOUNTER — Ambulatory Visit (INDEPENDENT_AMBULATORY_CARE_PROVIDER_SITE_OTHER): Admitting: Nurse Practitioner

## 2023-12-11 ENCOUNTER — Encounter: Payer: Self-pay | Admitting: Nurse Practitioner

## 2023-12-11 VITALS — BP 140/86 | HR 74 | Temp 98.7°F | Ht 60.5 in | Wt 155.0 lb

## 2023-12-11 DIAGNOSIS — E1169 Type 2 diabetes mellitus with other specified complication: Secondary | ICD-10-CM | POA: Diagnosis not present

## 2023-12-11 DIAGNOSIS — E6609 Other obesity due to excess calories: Secondary | ICD-10-CM

## 2023-12-11 DIAGNOSIS — I700 Atherosclerosis of aorta: Secondary | ICD-10-CM

## 2023-12-11 DIAGNOSIS — Z8542 Personal history of malignant neoplasm of other parts of uterus: Secondary | ICD-10-CM | POA: Diagnosis not present

## 2023-12-11 DIAGNOSIS — I152 Hypertension secondary to endocrine disorders: Secondary | ICD-10-CM | POA: Diagnosis not present

## 2023-12-11 DIAGNOSIS — M25472 Effusion, left ankle: Secondary | ICD-10-CM

## 2023-12-11 DIAGNOSIS — Z6831 Body mass index (BMI) 31.0-31.9, adult: Secondary | ICD-10-CM

## 2023-12-11 DIAGNOSIS — Z0000 Encounter for general adult medical examination without abnormal findings: Secondary | ICD-10-CM

## 2023-12-11 DIAGNOSIS — E785 Hyperlipidemia, unspecified: Secondary | ICD-10-CM | POA: Diagnosis not present

## 2023-12-11 DIAGNOSIS — E669 Obesity, unspecified: Secondary | ICD-10-CM

## 2023-12-11 DIAGNOSIS — E1159 Type 2 diabetes mellitus with other circulatory complications: Secondary | ICD-10-CM

## 2023-12-11 DIAGNOSIS — E66811 Obesity, class 1: Secondary | ICD-10-CM

## 2023-12-11 LAB — MICROALBUMIN, URINE WAIVED
Creatinine, Urine Waived: 50 mg/dL (ref 10–300)
Microalb, Ur Waived: 30 mg/L — ABNORMAL HIGH (ref 0–19)

## 2023-12-11 LAB — BAYER DCA HB A1C WAIVED: HB A1C (BAYER DCA - WAIVED): 6.6 % — ABNORMAL HIGH (ref 4.8–5.6)

## 2023-12-11 MED ORDER — OLMESARTAN MEDOXOMIL 20 MG PO TABS: 20.0000 mg | ORAL_TABLET | Freq: Every day | ORAL | 3 refills | Status: AC

## 2023-12-11 MED ORDER — ATORVASTATIN CALCIUM 80 MG PO TABS: 80.0000 mg | ORAL_TABLET | Freq: Every day | ORAL | 3 refills | Status: AC

## 2023-12-11 NOTE — Assessment & Plan Note (Signed)
**Note De-Identified Wares Obfuscation** BMI 29.77 with slight trend down.  Recommended eating smaller high protein, low fat meals more frequently and exercising 30 mins a day 5 times a week with a goal of 10-15lb weight loss in the next 3 months. Patient voiced their understanding and motivation to adhere to these recommendations.

## 2023-12-11 NOTE — Progress Notes (Signed)
**Note De-Identified [place] Obfuscation** BP (!) 140/86 (BP Location: Left Arm, Patient Position: Sitting, Cuff Size: Normal)   Pulse 74   Temp 98.7 F (37.1 C) (Oral)   Ht 5' 0.5" (1.537 m)   Wt 155 lb (70.3 kg)   LMP  (LMP Unknown)   SpO2 97%   BMI 29.77 kg/m    Subjective:    Patient ID: Suzanne Jennings, female    DOB: 09/01/1950, 73 y.o.   MRN: [id number]  HPI: Suzanne Jennings is a 73 y.o. female presenting on 12/11/2023 for comprehensive medical examination. Current medical complaints include:none  She currently lives with: husband Menopausal Symptoms: no  When coming off steps to come here she slipped on her steps, did not fall but tweaked ankle some and it is swollen.   DIABETES Last A1c was 7.5% in January.  Took Metformin  in past but had allergic reaction to this.  She has been focused on diet and activity.  Prefers not to restart medication of any kind, have discussed multiple times.  She is back to walking and focusing on diet.  Occasional bladder leakage present, not frequently. Hypoglycemic episodes:no Polydipsia/polyuria: no Visual disturbance: no Chest pain: no Paresthesias: no Glucose Monitoring: yes  Accucheck frequency: Daily -- 119 to 120 average when checks  Fasting glucose:   Post prandial:  Evening:  Before meals: Taking Insulin?: no  Long acting insulin:  Short acting insulin: Blood Pressure Monitoring: a few times a week Retinal Examination: Up to Date Foot Exam: Up to Date Diabetic Education: Not Completed Pneumovax: refuses Influenza: refuses Aspirin : yes   HYPERTENSION / HYPERLIPIDEMIA Continues Atorvastatin , Olmesartan , + ASA.  CT scan 2019 noted aortic atherosclerosis.  Stopped Olemsartan for a period while doing PT, but now is back to taking. Satisfied with current treatment? yes Duration of hypertension: chronic BP monitoring frequency: daily BP range: on average <130/80 on home checks BP medication side effects: no Duration of hyperlipidemia: chronic Cholesterol medication  side effects: no Cholesterol supplements: none Medication compliance: good compliance Aspirin : yes Recent stressors: no Recurrent headaches: no Visual changes: no Palpitations: no Dyspnea: no Chest pain: no Lower extremity edema: no Dizzy/lightheaded: no   ENDOMETRIAL CANCER: History of endometrial cancer (grade 1 endometrial cancer with focal polyp) in June 2019, a complete hysterectomy was performed. No chemo or radiation.  Followed by GYN in past, last visit 03/26/19 -- missed appointment scheduled with Dr. Marella Shams for 03/31/20. Reports overall doing well and does not want any further internal exams.  Denies any symptoms.   Depression Screen done today and results listed below:     12/11/2023    2:41 PM 08/23/2023    9:18 AM 08/13/2023    8:24 AM 02/13/2023    9:15 AM 11/10/2022   10:49 AM  Depression screen PHQ 2/9  Decreased Interest 0 0 0 0 0  Down, Depressed, Hopeless 0 0 0 0 0  PHQ - 2 Score 0 0 0 0 0  Altered sleeping 0 0  0 0  Tired, decreased energy 0 0  0 0  Change in appetite 0 0  0 0  Feeling bad or failure about yourself  0 0  0 0  Trouble concentrating 0 0  0 0  Moving slowly or fidgety/restless 0 0  0 0  Suicidal thoughts 0 0  0 0  PHQ-9 Score 0 0  0 0  Difficult doing work/chores Not difficult at all Not difficult at all  Not difficult at all Not difficult at all **Note De-Identified [place] Obfuscation** 12/11/2023    2:41 PM 08/23/2023    9:18 AM 02/13/2023    9:15 AM 11/10/2022   10:49 AM  GAD 7 : Generalized Anxiety Score  Nervous, Anxious, on Edge 0 0 0 0  Control/stop worrying 0 0 0 0  Worry too much - different things 0 0 0 0  Trouble relaxing 0 0 0 0  Restless 0 0 0 0  Easily annoyed or irritable 0 0 0 0  Afraid - awful might happen 0 0 0 0  Total GAD 7 Score 0 0 0 0  Anxiety Difficulty Not difficult at all Not difficult at all Not difficult at all Not difficult at all        08/11/2022   11:08 AM 02/13/2023    9:14 AM 08/13/2023    8:27 AM 08/23/2023    9:18 AM 12/11/2023    2:41 PM   Fall Risk  Falls in the past year? 0 0 1 0 0  Was there an injury with Fall? 0 0 0 0 0  Fall Risk Category Calculator 0 0 2 0 0  Fall Risk Category (Retired) Low      (RETIRED) Patient Fall Risk Level Low fall risk      Patient at Risk for Falls Due to No Fall Risks No Fall Risks History of fall(s);Impaired balance/gait;Orthopedic patient No Fall Risks No Fall Risks  Fall risk Follow up Falls prevention discussed Falls evaluation completed Education provided;Falls prevention discussed;Falls evaluation completed Falls evaluation completed Falls evaluation completed    Past Medical History:  Past Medical History:  Diagnosis Date   Diabetes mellitus without complication (HCC)    Endometrial cancer (HCC)    History of kidney stones    Hyperlipidemia    Hypertension    Kidney stone    Knee pain     Surgical History:  Past Surgical History:  Procedure Laterality Date   APPENDECTOMY     COLONOSCOPY WITH PROPOFOL  N/A 07/03/2016   Procedure: COLONOSCOPY WITH PROPOFOL ;  Surgeon: Marnee Sink, MD;  Location: [facility];  Service: Endoscopy;  Laterality: N/A;   LAPAROSCOPIC HYSTERECTOMY N/A 02/20/2018   Procedure: HYSTERECTOMY TOTAL LAPAROSCOPIC, WITH BILATERAL SALPINGOOPHERECTOMY;  Surgeon: Hermine Loots, MD;  Location: [facility];  Service: Gynecology;  Laterality: N/A;   POLYPECTOMY  07/03/2016   Procedure: POLYPECTOMY;  Surgeon: Marnee Sink, MD;  Location: [facility];  Service: Endoscopy;;   SENTINEL NODE BIOPSY N/A 02/20/2018   Procedure: SENTINEL NODE BIOPSY AND MAPPING;  Surgeon: Hermine Loots, MD;  Location: [facility];  Service: Gynecology;  Laterality: N/A;   TUBAL LIGATION      Medications:  Current Outpatient Medications on File Prior to Visit  Medication Sig   Apoaequorin (PREVAGEN) 10 MG CAPS Take 1 capsule by mouth daily.   aspirin  EC 81 MG tablet Take 1 tablet (81 mg total) by mouth daily.   Berberine Chloride 500 MG CAPS Take 1 capsule by mouth daily  at 2 PM.   Blood Glucose Monitoring Suppl (ONETOUCH VERIO FLEX SYSTEM) w/Device KIT Use to check blood sugar 3-4 times a day and document for provider visits.  Goal less then 130 in morning fasting and less then 180 two hours after a meal.   cholecalciferol (VITAMIN D3) 25 MCG (1000 UNIT) tablet Take 1,000 Units by mouth daily.   ONETOUCH VERIO test strip Use to check sugar 2-3 times daily.   OVER THE COUNTER MEDICATION Take 1 capsule by mouth daily. Zendocrine otc supplement   OVER **Note De-Identified [place] Obfuscation** THE COUNTER MEDICATION Take 2 capsules by mouth daily. Moerie Ultimate hair growth   Probiotic Product (PROBIOTIC PO) Take 1 capsule by mouth daily.    Turmeric (QC TUMERIC COMPLEX PO) Take 1 tablet by mouth daily.   No current facility-administered medications on file prior to visit.    Allergies:  Allergies  Allergen Reactions   Metformin  And Related Rash   Aspirin  Other (See Comments)    "blood thinner, makes nose bleed - but pt can take 81 mg dose without problems   Other Other (See Comments)    Yogurt causes yeast infections Nuts except peanuts and almonds cause anaphylaxis    Penicillins Swelling and Rash    Has patient had a PCN reaction causing immediate rash, facial/tongue/throat swelling, SOB or lightheadedness with hypotension: Yes Has patient had a PCN reaction causing severe rash involving mucus membranes or skin necrosis: No Has patient had a PCN reaction that required hospitalization: No Has patient had a PCN reaction occurring within the last 10 years: No If all of the above answers are "NO", then may proceed with Cephalosporin use.    Sulfa Antibiotics Swelling and Rash    Social History:  Social History   Socioeconomic History   Marital status: Married    Spouse name: Public house manager   Number of children: 2   Years of education: Not on file   Highest education level: Not on file  Occupational History   Occupation: retired  Tobacco Use   Smoking status: Never   Smokeless tobacco: Never   Vaping Use   Vaping status: Never Used  Substance and Sexual Activity   Alcohol use: No    Alcohol/week: 0.0 standard drinks of alcohol   Drug use: No   Sexual activity: Yes    Birth control/protection: Post-menopausal  Other Topics Concern   Not on file  Social History Narrative   2 adult children, 6 grandchildren   Social Drivers of Corporate investment banker Strain: Low Risk  (08/13/2023)   Overall Financial Resource Strain (CARDIA)    Difficulty of Paying Living Expenses: Not hard at all  Food Insecurity: No Food Insecurity (08/13/2023)   Hunger Vital Sign    Worried About Running Out of Food in the Last Year: Never true    Ran Out of Food in the Last Year: Never true  Transportation Needs: No Transportation Needs (08/13/2023)   PRAPARE - Administrator, Civil Service (Medical): No    Lack of Transportation (Non-Medical): No  Physical Activity: Sufficiently Active (08/13/2023)   Exercise Vital Sign    Days of Exercise per Week: 7 days    Minutes of Exercise per Session: 30 min  Stress: No Stress Concern Present (08/13/2023)   [employer] of Occupational Health - Occupational Stress Questionnaire    Feeling of Stress : Not at all  Social Connections: Moderately Integrated (08/13/2023)   Social Connection and Isolation Panel [NHANES]    Frequency of Communication with Friends and Family: More than three times a week    Frequency of Social Gatherings with Friends and Family: Three times a week    Attends Religious Services: More than 4 times per year    Active Member of Clubs or Organizations: No    Attends Banker Meetings: Never    Marital Status: Married  Catering manager Violence: Not At Risk (08/13/2023)   Humiliation, Afraid, Rape, and Kick questionnaire    Fear of Current or Ex-Partner: No    Emotionally Abused: **Note De-Identified [place] Obfuscation** No    Physically Abused: No    Sexually Abused: No   Social History   Tobacco Use  Smoking Status Never  Smokeless  Tobacco Never   Social History   Substance and Sexual Activity  Alcohol Use No   Alcohol/week: 0.0 standard drinks of alcohol    Family History:  Family History  Problem Relation Age of Onset   Emphysema Mother    Heart disease Father        massive MI   Hypertension Sister    Edema Sister    Hypertension Sister    Hypertension Sister    Mental illness Brother    Diabetes Maternal Grandmother    Breast cancer Neg Hx     Past medical history, surgical history, medications, allergies, family history and social history reviewed with patient today and changes made to appropriate areas of the chart.   ROS All other ROS negative except what is listed above and in the HPI.      Objective:    BP (!) 140/86 (BP Location: Left Arm, Patient Position: Sitting, Cuff Size: Normal)   Pulse 74   Temp 98.7 F (37.1 C) (Oral)   Ht 5' 0.5" (1.537 m)   Wt 155 lb (70.3 kg)   LMP  (LMP Unknown)   SpO2 97%   BMI 29.77 kg/m   Wt Readings from Last 3 Encounters:  12/11/23 155 lb (70.3 kg)  11/02/23 152 lb 6.4 oz (69.1 kg)  08/23/23 161 lb 6.4 oz (73.2 kg)    Physical Exam Vitals and nursing note reviewed. Exam conducted with a chaperone present.  Constitutional:      General: She is awake. She is not in acute distress.    Appearance: She is well-developed and well-groomed. She is not ill-appearing or toxic-appearing.  HENT:     Head: Normocephalic and atraumatic.     Right Ear: Hearing, tympanic membrane, ear canal and external ear normal. No drainage.     Left Ear: Hearing, tympanic membrane, ear canal and external ear normal. No drainage.     Nose: Nose normal.     Right Sinus: No maxillary sinus tenderness or frontal sinus tenderness.     Left Sinus: No maxillary sinus tenderness or frontal sinus tenderness.     Mouth/Throat:     Mouth: Mucous membranes are moist.     Pharynx: Oropharynx is clear. Uvula midline. No pharyngeal swelling, oropharyngeal exudate or posterior  oropharyngeal erythema.  Eyes:     General: Lids are normal.        Right eye: No discharge.        Left eye: No discharge.     Extraocular Movements: Extraocular movements intact.     Conjunctiva/sclera: Conjunctivae normal.     Pupils: Pupils are equal, round, and reactive to light.     Visual Fields: Right eye visual fields normal and left eye visual fields normal.  Neck:     Thyroid : No thyromegaly.     Vascular: No carotid bruit.     Trachea: Trachea normal.  Cardiovascular:     Rate and Rhythm: Normal rate and regular rhythm.     Heart sounds: Normal heart sounds. No murmur heard.    No gallop.  Pulmonary:     Effort: Pulmonary effort is normal. No accessory muscle usage or respiratory distress.     Breath sounds: Normal breath sounds.  Chest:  Breasts:    Right: Normal.     Left: Normal.  Abdominal: **Note De-Identified [place] Obfuscation** General: Bowel sounds are normal.     Palpations: Abdomen is soft. There is no hepatomegaly or splenomegaly.     Tenderness: There is no abdominal tenderness.  Musculoskeletal:        General: Normal range of motion.     Cervical back: Normal range of motion and neck supple.     Right lower leg: No edema.     Left lower leg: No edema.  Lymphadenopathy:     Head:     Right side of head: No submental, submandibular, tonsillar, preauricular or posterior auricular adenopathy.     Left side of head: No submental, submandibular, tonsillar, preauricular or posterior auricular adenopathy.     Cervical: No cervical adenopathy.     Upper Body:     Right upper body: No supraclavicular, axillary or pectoral adenopathy.     Left upper body: No supraclavicular, axillary or pectoral adenopathy.  Skin:    General: Skin is warm and dry.     Capillary Refill: Capillary refill takes less than 2 seconds.     Findings: No rash.  Neurological:     Mental Status: She is alert and oriented to person, place, and time.     Gait: Gait is intact.     Deep Tendon Reflexes: Reflexes are  normal and symmetric.     Reflex Scores:      Brachioradialis reflexes are 2+ on the right side and 2+ on the left side.      Patellar reflexes are 2+ on the right side and 2+ on the left side. Psychiatric:        Attention and Perception: Attention normal.        Mood and Affect: Mood normal.        Speech: Speech normal.        Behavior: Behavior normal. Behavior is cooperative.        Thought Content: Thought content normal.        Judgment: Judgment normal.     Results for orders placed or performed in visit on 11/02/23  CBC with Differential/Platelet   Collection Time: 11/02/23 11:57 AM  Result Value Ref Range   WBC 5.7 3.4 - 10.8 x10E3/uL   RBC 3.95 3.77 - 5.28 x10E6/uL   Hemoglobin 12.8 11.1 - 15.9 g/dL   Hematocrit 16.1 09.6 - 46.6 %   MCV 96 79 - 97 fL   MCH 32.4 26.6 - 33.0 pg   MCHC 33.9 31.5 - 35.7 g/dL   RDW 04.5 40.9 - 81.1 %   Platelets 252 150 - 450 x10E3/uL   Neutrophils 58 Not Estab. %   Lymphs 32 Not Estab. %   Monocytes 9 Not Estab. %   Eos 0 Not Estab. %   Basos 1 Not Estab. %   Neutrophils Absolute 3.3 1.4 - 7.0 x10E3/uL   Lymphocytes Absolute 1.8 0.7 - 3.1 x10E3/uL   Monocytes Absolute 0.5 0.1 - 0.9 x10E3/uL   EOS (ABSOLUTE) 0.0 0.0 - 0.4 x10E3/uL   Basophils Absolute 0.0 0.0 - 0.2 x10E3/uL   Immature Granulocytes 0 Not Estab. %   Immature Grans (Abs) 0.0 0.0 - 0.1 x10E3/uL  Comprehensive metabolic panel   Collection Time: 11/02/23 11:57 AM  Result Value Ref Range   Glucose 148 (H) 70 - 99 mg/dL   BUN 8 8 - 27 mg/dL   Creatinine, Ser 9.14 0.57 - 1.00 mg/dL   eGFR 92 >78 GN/FAO/1.30   BUN/Creatinine Ratio 12 12 - 28   Sodium 138 134 - **Note De-Identified [place] Obfuscation** 144 mmol/L   Potassium 3.6 3.5 - 5.2 mmol/L   Chloride 99 96 - 106 mmol/L   CO2 22 20 - 29 mmol/L   Calcium  9.4 8.7 - 10.3 mg/dL   Total Protein 6.7 6.0 - 8.5 g/dL   Albumin 4.0 3.8 - 4.8 g/dL   Globulin, Total 2.7 1.5 - 4.5 g/dL   Bilirubin Total 0.9 0.0 - 1.2 mg/dL   Alkaline Phosphatase 59 44 - 121 IU/L    AST 37 0 - 40 IU/L   ALT 28 0 - 32 IU/L      Assessment & Plan:   Problem List Items Addressed This Visit       Cardiovascular and Mediastinum   Hypertension associated with diabetes (HCC)   Chronic, stable.  BP with some elevation, although improving on recheck and home BP levels at goal.  On way here slipped on stairs and this caused some stress.  She is to alert provider ASAP if elevations >130/80 at home. Discussed with her goal BP <130/80.  Continue Olmesartan  at this time and adjust dose as needed, is tolerating well without ADR -- offers kidney protection with her diabetes.  Recommend she continue to monitor BP at home daily and document for provider + focus on DASH diet.  LABS: CBC, TSH, urine ALB, CMP.  Urine ALB 12 December 2023.      Relevant Medications   atorvastatin  (LIPITOR) 80 MG tablet   olmesartan  (BENICAR ) 20 MG tablet   Other Relevant Orders   Bayer DCA Hb A1c Waived   Microalbumin, Urine Waived   Comprehensive metabolic panel with GFR   CBC with Differential/Platelet   TSH   Aortic atherosclerosis (HCC)   Ongoing and stable.  Noted on CT 06/03/18.  Continue daily statin and ASA for prevention.      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  Metformin  caused rash.  Urine ALB 12 December 2023. - Continue off Metformin  due to side effect.  Could consider SGLT2 or Januvia if ongoing elevations in future.  She prefers to avoid injectables. Discussed with her goal is A1c <8% per the American Geriatric Society guidelines.  She prefers not to start medication at this time. - Continue Olmesartan  for kidney protection. - Continue to monitor BS at home daily and document + focus on diet.   - Refuses vaccinations. - Eye and foot exam up to date. - Recommend she continue Nervive OTC for occasional  neuropathy noted. - Return in 6 months.      Relevant Medications   atorvastatin  (LIPITOR) 80 MG tablet   olmesartan  (BENICAR ) 20 MG tablet   Other Relevant Orders   Bayer DCA Hb A1c Waived   Microalbumin, Urine Waived   Hyperlipidemia associated with type 2 diabetes mellitus (HCC)   Chronic, ongoing.  Continue current medication regimen and adjust as needed.  Lipid panel today.      Relevant Medications   atorvastatin  (LIPITOR) 80 MG tablet   olmesartan  (BENICAR ) 20 MG tablet   Other Relevant Orders   Bayer DCA Hb A1c Waived   Comprehensive metabolic panel with GFR   Lipid Panel w/o Chol/HDL Ratio     Other   Obesity   BMI 29.77 with slight trend down.  Recommended eating smaller high protein, low fat meals more frequently **Note De-Identified [place] Obfuscation** and exercising 30 mins a day 5 times a week with a goal of 10-15lb weight loss in the next 3 months. Patient voiced their understanding and motivation to adhere to these recommendations.       History of endometrial cancer   Stable, continue collaboration with GYN -- at this time she does not want any further internal exams performed and reports no symptoms present -- if symptoms present she agrees will notify GYN or PCP.      Other Visit Diagnoses       Left ankle swelling       Suspect mild sprain present.  Recommend RICE at home and if any worsening return to office ASAP.     Encounter for annual physical exam       Annual physical today with health maintenance reviewed.        Follow up plan: Return in about 6 months (around 06/11/2024) for T2DM, HTN/HLD.   LABORATORY TESTING:  - Pap smear: not applicable  IMMUNIZATIONS:   - Tdap: Tetanus vaccination status reviewed: last tetanus booster within 10 years. - Influenza: Refused - Pneumovax: Refused - Prevnar: Refused - COVID: Refused - HPV: Not applicable - Shingrix vaccine: Refused  SCREENING: -Mammogram: Ordered today  - Colonoscopy: Refused  - Bone Density: Up to date -- due next  in 2027 -Hearing Test: Not applicable  -Spirometry: Not applicable   PATIENT COUNSELING:   Advised to take 1 mg of folate supplement per day if capable of pregnancy.   Sexuality: Discussed sexually transmitted diseases, partner selection, use of condoms, avoidance of unintended pregnancy  and contraceptive alternatives.   Advised to avoid cigarette smoking.  I discussed with the patient that most people either abstain from alcohol or drink within safe limits (<=14/week and <=4 drinks/occasion for males, <=7/weeks and <= 3 drinks/occasion for females) and that the risk for alcohol disorders and other health effects rises proportionally with the number of drinks per week and how often a drinker exceeds daily limits.  Discussed cessation/primary prevention of drug use and availability of treatment for abuse.   Diet: Encouraged to adjust caloric intake to maintain  or achieve ideal body weight, to reduce intake of dietary saturated fat and total fat, to limit sodium intake by avoiding high sodium foods and not adding table salt, and to maintain adequate dietary potassium and calcium  preferably from fresh fruits, vegetables, and low-fat dairy products.    Stressed the importance of regular exercise  Injury prevention: Discussed safety belts, safety helmets, smoke detector, smoking near bedding or upholstery.   Dental health: Discussed importance of regular tooth brushing, flossing, and dental visits.    NEXT PREVENTATIVE PHYSICAL DUE IN 1 YEAR. Return in about 6 months (around 06/11/2024) for T2DM, HTN/HLD.

## 2023-12-11 NOTE — Assessment & Plan Note (Signed)
**Note De-identified Cooks Obfuscation** Stable, continue collaboration with GYN -- at this time she does not want any further internal exams performed and reports no symptoms present -- if symptoms present she agrees will notify GYN or PCP. 

## 2023-12-11 NOTE — Assessment & Plan Note (Signed)
 Chronic, ongoing.  Continue current medication regimen and adjust as needed. Lipid panel today.

## 2023-12-11 NOTE — Assessment & Plan Note (Addendum)
**Note De-Identified [place] Obfuscation** Chronic, stable.  BP with some elevation, although improving on recheck and home BP levels at goal.  On way here slipped on stairs and this caused some stress.  She is to alert provider ASAP if elevations >130/80 at home. Discussed with her goal BP <130/80.  Continue Olmesartan  at this time and adjust dose as needed, is tolerating well without ADR -- offers kidney protection with her diabetes.  Recommend she continue to monitor BP at home daily and document for provider + focus on DASH diet.  LABS: CBC, TSH, urine ALB, CMP.  Urine ALB 12 December 2023.

## 2023-12-11 NOTE — Assessment & Plan Note (Signed)
**Note De-identified [place] Obfuscation** Ongoing and stable.  Noted on CT 06/03/18.  Continue daily statin and ASA for prevention. 

## 2023-12-11 NOTE — Assessment & Plan Note (Addendum)
**Note De-Identified Brearley Obfuscation** Chronic, ongoing with A1c 6.6% today, trending down.  Metformin  caused rash.  Urine ALB 12 December 2023. - Continue off Metformin  due to side effect.  Could consider SGLT2 or Januvia if ongoing elevations in future.  She prefers to avoid injectables. Discussed with her goal is A1c <8% per the American Geriatric Society guidelines.  She prefers not to start medication at this time. - Continue Olmesartan  for kidney protection. - Continue to monitor BS at home daily and document + focus on diet.   - Refuses vaccinations. - Eye and foot exam up to date. - Recommend she continue Nervive OTC for occasional neuropathy noted. - Return in 6 months.

## 2023-12-12 ENCOUNTER — Encounter: Payer: Self-pay | Admitting: Nurse Practitioner

## 2023-12-12 LAB — CBC WITH DIFFERENTIAL/PLATELET
Basophils Absolute: 0 10*3/uL (ref 0.0–0.2)
Basos: 1 %
EOS (ABSOLUTE): 0.1 10*3/uL (ref 0.0–0.4)
Eos: 1 %
Hematocrit: 39.8 % (ref 34.0–46.6)
Hemoglobin: 13.7 g/dL (ref 11.1–15.9)
Immature Grans (Abs): 0 10*3/uL (ref 0.0–0.1)
Immature Granulocytes: 0 %
Lymphocytes Absolute: 2.3 10*3/uL (ref 0.7–3.1)
Lymphs: 31 %
MCH: 33.3 pg — ABNORMAL HIGH (ref 26.6–33.0)
MCHC: 34.4 g/dL (ref 31.5–35.7)
MCV: 97 fL (ref 79–97)
Monocytes Absolute: 0.6 10*3/uL (ref 0.1–0.9)
Monocytes: 8 %
Neutrophils Absolute: 4.5 10*3/uL (ref 1.4–7.0)
Neutrophils: 59 %
Platelets: 266 10*3/uL (ref 150–450)
RBC: 4.11 x10E6/uL (ref 3.77–5.28)
RDW: 12.5 % (ref 11.7–15.4)
WBC: 7.6 10*3/uL (ref 3.4–10.8)

## 2023-12-12 LAB — TSH: TSH: 1.74 u[IU]/mL (ref 0.450–4.500)

## 2023-12-12 LAB — COMPREHENSIVE METABOLIC PANEL WITH GFR
ALT: 48 IU/L — ABNORMAL HIGH (ref 0–32)
AST: 41 IU/L — ABNORMAL HIGH (ref 0–40)
Albumin: 4.6 g/dL (ref 3.8–4.8)
Alkaline Phosphatase: 84 IU/L (ref 44–121)
BUN/Creatinine Ratio: 14 (ref 12–28)
BUN: 10 mg/dL (ref 8–27)
Bilirubin Total: 1 mg/dL (ref 0.0–1.2)
CO2: 22 mmol/L (ref 20–29)
Calcium: 9.8 mg/dL (ref 8.7–10.3)
Chloride: 101 mmol/L (ref 96–106)
Creatinine, Ser: 0.69 mg/dL (ref 0.57–1.00)
Globulin, Total: 2.4 g/dL (ref 1.5–4.5)
Glucose: 101 mg/dL — ABNORMAL HIGH (ref 70–99)
Potassium: 4.3 mmol/L (ref 3.5–5.2)
Sodium: 140 mmol/L (ref 134–144)
Total Protein: 7 g/dL (ref 6.0–8.5)
eGFR: 91 mL/min/{1.73_m2} (ref >59)

## 2023-12-12 LAB — LIPID PANEL W/O CHOL/HDL RATIO
Cholesterol, Total: 141 mg/dL (ref 100–199)
HDL: 36 mg/dL — ABNORMAL LOW (ref >39)
LDL Chol Calc (NIH): 78 mg/dL (ref 0–99)
Triglycerides: 155 mg/dL — ABNORMAL HIGH (ref 0–149)
VLDL Cholesterol Cal: 27 mg/dL (ref 5–40)

## 2023-12-12 NOTE — Progress Notes (Signed)
**Note De-Identified [place] Obfuscation** Contacted [facility]   Good afternoon Derin, your labs have returned: - Kidney function, creatinine and eGFR, remains normal.  Liver function shows some elevations, mild, we will recheck next visit. - Lipid panel has stable LDL at 78 and triglycerides remain a little elevated, for now continue Atorvastatin  as ordered, but in future may benefit trial of change to Rosuvastatin.  Any questions? Keep being amazing!!  Thank you for allowing me to participate in your care.  I appreciate you. Kindest regards, Hokulani Rogel

## 2023-12-25 ENCOUNTER — Ambulatory Visit
Admission: RE | Admit: 2023-12-25 | Discharge: 2023-12-25 | Disposition: A | Discharge status: Home / Self Care | Source: Ambulatory Visit | Attending: Family Medicine | Admitting: Family Medicine

## 2023-12-25 DIAGNOSIS — Z1231 Encounter for screening mammogram for malignant neoplasm of breast: Secondary | ICD-10-CM | POA: Insufficient documentation

## 2024-05-07 DIAGNOSIS — H2513 Age-related nuclear cataract, bilateral: Secondary | ICD-10-CM | POA: Diagnosis not present

## 2024-05-07 DIAGNOSIS — E113212 Type 2 diabetes mellitus with mild nonproliferative diabetic retinopathy with macular edema, left eye: Secondary | ICD-10-CM | POA: Diagnosis not present

## 2024-05-07 DIAGNOSIS — H538 Other visual disturbances: Secondary | ICD-10-CM | POA: Diagnosis not present

## 2024-05-07 DIAGNOSIS — E113291 Type 2 diabetes mellitus with mild nonproliferative diabetic retinopathy without macular edema, right eye: Secondary | ICD-10-CM | POA: Diagnosis not present

## 2024-06-08 NOTE — Patient Instructions (Signed)

## 2024-06-11 ENCOUNTER — Ambulatory Visit: Admitting: Nurse Practitioner

## 2024-06-11 ENCOUNTER — Encounter: Payer: Self-pay | Admitting: Nurse Practitioner

## 2024-06-11 VITALS — BP 138/84 | HR 73 | Temp 97.9°F | Resp 15 | Ht 60.51 in | Wt 158.2 lb

## 2024-06-11 DIAGNOSIS — Z6831 Body mass index (BMI) 31.0-31.9, adult: Secondary | ICD-10-CM

## 2024-06-11 DIAGNOSIS — E1169 Type 2 diabetes mellitus with other specified complication: Secondary | ICD-10-CM | POA: Diagnosis not present

## 2024-06-11 DIAGNOSIS — E119 Type 2 diabetes mellitus without complications: Secondary | ICD-10-CM | POA: Diagnosis not present

## 2024-06-11 DIAGNOSIS — E66811 Obesity, class 1: Secondary | ICD-10-CM

## 2024-06-11 DIAGNOSIS — I700 Atherosclerosis of aorta: Secondary | ICD-10-CM | POA: Diagnosis not present

## 2024-06-11 DIAGNOSIS — E1159 Type 2 diabetes mellitus with other circulatory complications: Secondary | ICD-10-CM

## 2024-06-11 DIAGNOSIS — I152 Hypertension secondary to endocrine disorders: Secondary | ICD-10-CM

## 2024-06-11 DIAGNOSIS — E785 Hyperlipidemia, unspecified: Secondary | ICD-10-CM

## 2024-06-11 DIAGNOSIS — E6609 Other obesity due to excess calories: Secondary | ICD-10-CM

## 2024-06-11 DIAGNOSIS — E669 Obesity, unspecified: Secondary | ICD-10-CM

## 2024-06-11 LAB — BAYER DCA HB A1C WAIVED: HB A1C (BAYER DCA - WAIVED): 7.3 % — ABNORMAL HIGH (ref 4.8–5.6)

## 2024-06-11 NOTE — Assessment & Plan Note (Signed)
 Chronic, ongoing.  Continue current medication regimen and adjust as needed. Lipid panel today.

## 2024-06-11 NOTE — Assessment & Plan Note (Signed)
**Note De-Identified Kobrin Obfuscation** Chronic, stable.  Has not taken medication this morning.  BP with some elevation, although improving on recheck and home BP levels at goal.  She is to alert provider ASAP if elevations >130/80 at home. Discussed with her goal BP <130/80.  Continue Olmesartan  at this time and adjust dose as needed, is tolerating well without ADR -- offers kidney protection with her diabetes.  Recommend she continue to monitor BP at home daily and document for provider + focus on DASH diet.  LABS: CMP.  Urine ALB 12 December 2023.

## 2024-06-11 NOTE — Assessment & Plan Note (Signed)
**Note De-identified [place] Obfuscation** Ongoing and stable.  Noted on CT 06/03/18.  Continue daily statin and ASA for prevention. 

## 2024-06-11 NOTE — Assessment & Plan Note (Signed)
**Note De-Identified [place] Obfuscation** Chronic, ongoing with A1c 7.3% today, trending up due to more sugar intake recently.  Metformin  caused rash.  Urine ALB 12 December 2023. - Continue off Metformin  due to side effect. Could consider SGLT2 or Januvia if ongoing elevations in future.  She prefers to avoid injectables. Discussed with her goal is A1c <8% per the American Ger[employer] guidelines. She prefers not to start medication at this time and instead wishes to regroup and focus on diet. - Continue Olmesartan  for kidney protection. - Continue to monitor BS at home daily and document + focus on diet.   - Refuses vaccinations. - Eye and foot exam up to date. - Return in 6 months.

## 2024-06-11 NOTE — Progress Notes (Signed)
**Note De-Identified [place] Obfuscation** BP 138/84 (BP Location: Left Arm, Patient Position: Sitting, Cuff Size: Normal) Comment: has not taken medication this morning  Pulse 73   Temp 97.9 F (36.6 C) (Oral)   Resp 15   Ht 5' 0.51 (1.537 m)   Wt 158 lb 3.2 oz (71.8 kg)   LMP  (LMP Unknown)   SpO2 97%   BMI 30.38 kg/m    Subjective:    Patient ID: Suzanne Jennings, female    DOB: 1951-03-19, 73 y.o.   MRN: [id number]  HPI: [name] is a 73 y.o. female  Chief Complaint  Patient presents with   Diabetes    Once daily fasting am usually ranging around 121. Walking more now.    HTN/HLD    Home checks   DIABETES April A1c 6.6%. Metformin  taken in past but had allergic reaction to this. Focuses on diet and activity.  Prefers not to restart medication of any kind, have discussed multiple times.  Walking daily and focused on diet. Concerned A1c may be up a little as has been taking care of neighbor who cooks lots of sugary foods. Hypoglycemic episodes:no Polydipsia/polyuria: no Visual disturbance: no Chest pain: no Paresthesias: no Glucose Monitoring: yes  Accucheck frequency: Daily - fasting 119 to 121 range at baseline  Fasting glucose:   Post prandial:  Evening:  Before meals: Taking Insulin?: no  Long acting insulin:  Short acting insulin: Blood Pressure Monitoring: a few times a week Retinal Examination: Up to Date -- Dr. Myrna Gibbs Eye Foot Exam: Up to Date Diabetic Education: Not Completed Pneumovax: refuses Influenza: refuses Aspirin : yes   HYPERTENSION / HYPERLIPIDEMIA Taking Atorvastatin , Olmesartan , + ASA. CT scan 2019 noted aortic atherosclerosis. Has not taken BP medication this morning.  Satisfied with current treatment? yes Duration of hypertension: chronic BP monitoring frequency: daily BP range: on average <130/80 on home checks after medication, prior to taking medications is a little higher BP medication side effects: no Duration of hyperlipidemia: chronic Cholesterol medication  side effects: no Cholesterol supplements: none Medication compliance: good compliance Aspirin : yes Recent stressors: no Recurrent headaches: no Visual changes: no Palpitations: no Dyspnea: no Chest pain: no Lower extremity edema: no Dizzy/lightheaded: no   Relevant past medical, surgical, family and social history reviewed and updated as indicated. Interim medical history since our last visit reviewed. Allergies and medications reviewed and updated.  Review of Systems  Constitutional:  Negative for activity change, appetite change, diaphoresis, fatigue and fever.  Respiratory:  Negative for cough, chest tightness and shortness of breath.   Cardiovascular:  Negative for chest pain, palpitations and leg swelling.  Gastrointestinal: Negative.   Endocrine: Negative for cold intolerance, heat intolerance, polydipsia, polyphagia and polyuria.  Neurological: Negative.   Psychiatric/Behavioral: Negative.      Per HPI unless specifically indicated above     Objective:    BP 138/84 (BP Location: Left Arm, Patient Position: Sitting, Cuff Size: Normal) Comment: has not taken medication this morning  Pulse 73   Temp 97.9 F (36.6 C) (Oral)   Resp 15   Ht 5' 0.51 (1.537 m)   Wt 158 lb 3.2 oz (71.8 kg)   LMP  (LMP Unknown)   SpO2 97%   BMI 30.38 kg/m   Wt Readings from Last 3 Encounters:  06/11/24 158 lb 3.2 oz (71.8 kg)  12/11/23 155 lb (70.3 kg)  11/02/23 152 lb 6.4 oz (69.1 kg)    Physical Exam Vitals and nursing note reviewed.  Constitutional: **Note De-Identified [place] Obfuscation** General: She is awake. She is not in acute distress.    Appearance: She is well-developed and well-groomed. She is obese. She is not ill-appearing.  HENT:     Head: Normocephalic.     Right Ear: Hearing normal.     Left Ear: Hearing normal.  Eyes:     General: Lids are normal.        Right eye: No discharge.        Left eye: No discharge.     Conjunctiva/sclera: Conjunctivae normal.     Pupils: Pupils are equal, round,  and reactive to light.  Neck:     Thyroid : No thyromegaly.     Vascular: No carotid bruit.  Cardiovascular:     Rate and Rhythm: Normal rate and regular rhythm.     Heart sounds: Normal heart sounds. No murmur heard.    No gallop.  Pulmonary:     Effort: Pulmonary effort is normal. No accessory muscle usage or respiratory distress.     Breath sounds: Normal breath sounds.  Abdominal:     General: Bowel sounds are normal. There is no distension.     Palpations: Abdomen is soft.     Tenderness: There is no abdominal tenderness. There is no right CVA tenderness or left CVA tenderness.  Musculoskeletal:     Cervical back: Normal range of motion and neck supple.     Right lower leg: No edema.     Left lower leg: No edema.  Skin:    General: Skin is warm and dry.  Neurological:     Mental Status: She is alert and oriented to person, place, and time.  Psychiatric:        Attention and Perception: Attention normal.        Mood and Affect: Mood normal.        Speech: Speech normal.        Behavior: Behavior normal. Behavior is cooperative.        Thought Content: Thought content normal.    Results for orders placed or performed in visit on 12/11/23  Bayer DCA Hb A1c Waived   Collection Time: 12/11/23  2:41 PM  Result Value Ref Range   HB A1C (BAYER DCA - WAIVED) 6.6 (H) 4.8 - 5.6 %  Microalbumin, Urine Waived   Collection Time: 12/11/23  2:41 PM  Result Value Ref Range   Microalb, Ur Waived 30 (H) 0 - 19 mg/L   Creatinine, Urine Waived 50 10 - 300 mg/dL   Microalb/Creat Ratio 30-300 (H) <30 mg/g  Comprehensive metabolic panel with GFR   Collection Time: 12/11/23  2:42 PM  Result Value Ref Range   Glucose 101 (H) 70 - 99 mg/dL   BUN 10 8 - 27 mg/dL   Creatinine, Ser 9.30 0.57 - 1.00 mg/dL   eGFR 91 >40 fO/fpw/8.26   BUN/Creatinine Ratio 14 12 - 28   Sodium 140 134 - 144 mmol/L   Potassium 4.3 3.5 - 5.2 mmol/L   Chloride 101 96 - 106 mmol/L   CO2 22 20 - 29 mmol/L   Calcium   9.8 8.7 - 10.3 mg/dL   Total Protein 7.0 6.0 - 8.5 g/dL   Albumin 4.6 3.8 - 4.8 g/dL   Globulin, Total 2.4 1.5 - 4.5 g/dL   Bilirubin Total 1.0 0.0 - 1.2 mg/dL   Alkaline Phosphatase 84 44 - 121 IU/L   AST 41 (H) 0 - 40 IU/L   ALT 48 (H) 0 - 32 IU/L  Lipid **Note De-Identified [place] Obfuscation** Panel w/o Chol/HDL Ratio   Collection Time: 12/11/23  2:42 PM  Result Value Ref Range   Cholesterol, Total 141 100 - 199 mg/dL   Triglycerides 844 (H) 0 - 149 mg/dL   HDL 36 (L) >60 mg/dL   VLDL Cholesterol Cal 27 5 - 40 mg/dL   LDL Chol Calc (NIH) 78 0 - 99 mg/dL  CBC with Differential/Platelet   Collection Time: 12/11/23  2:42 PM  Result Value Ref Range   WBC 7.6 3.4 - 10.8 x10E3/uL   RBC 4.11 3.77 - 5.28 x10E6/uL   Hemoglobin 13.7 11.1 - 15.9 g/dL   Hematocrit 60.1 65.9 - 46.6 %   MCV 97 79 - 97 fL   MCH 33.3 (H) 26.6 - 33.0 pg   MCHC 34.4 31.5 - 35.7 g/dL   RDW 87.4 88.2 - 84.5 %   Platelets 266 150 - 450 x10E3/uL   Neutrophils 59 Not Estab. %   Lymphs 31 Not Estab. %   Monocytes 8 Not Estab. %   Eos 1 Not Estab. %   Basos 1 Not Estab. %   Neutrophils Absolute 4.5 1.4 - 7.0 x10E3/uL   Lymphocytes Absolute 2.3 0.7 - 3.1 x10E3/uL   Monocytes Absolute 0.6 0.1 - 0.9 x10E3/uL   EOS (ABSOLUTE) 0.1 0.0 - 0.4 x10E3/uL   Basophils Absolute 0.0 0.0 - 0.2 x10E3/uL   Immature Granulocytes 0 Not Estab. %   Immature Grans (Abs) 0.0 0.0 - 0.1 x10E3/uL  TSH   Collection Time: 12/11/23  2:42 PM  Result Value Ref Range   TSH 1.740 0.450 - 4.500 uIU/mL      Assessment & Plan:   Problem List Items Addressed This Visit       Cardiovascular and Mediastinum   Hypertension associated with diabetes (HCC)   Chronic, stable.  Has not taken medication this morning.  BP with some elevation, although improving on recheck and home BP levels at goal.  She is to alert provider ASAP if elevations >130/80 at home. Discussed with her goal BP <130/80.  Continue Olmesartan  at this time and adjust dose as needed, is tolerating well without ADR --  offers kidney protection with her diabetes.  Recommend she continue to monitor BP at home daily and document for provider + focus on DASH diet.  LABS: CMP.  Urine ALB 12 December 2023.      Relevant Orders   Bayer DCA Hb A1c Waived   Comprehensive metabolic panel with GFR   Aortic atherosclerosis   Ongoing and stable.  Noted on CT 06/03/18.  Continue daily statin and ASA for prevention.        Endocrine   Type 2 diabetes mellitus in patient with obesity (HCC) - Primary   Chronic, ongoing with A1c 7.3% today, trending up due to more sugar intake recently.  Metformin  caused rash.  Urine ALB 12 December 2023. - Continue off Metformin  due to side effect. Could consider SGLT2 or Januvia if ongoing elevations in future.  She prefers to avoid injectables. Discussed with her goal is A1c <8% per the American Geriatric Society guidelines. She prefers not to start medication at this time and instead wishes to regroup and focus on diet. - Continue Olmesartan  for kidney protection. - Continue to monitor BS at home daily and document + focus on diet.   - Refuses vaccinations. - Eye and foot exam up to date. - Return in 6 months.      Relevant Orders   Bayer DCA Hb A1c Waived **Note De-Identified [place] Obfuscation** Hyperlipidemia associated with type 2 diabetes mellitus (HCC)   Chronic, ongoing.  Continue current medication regimen and adjust as needed.  Lipid panel today.      Relevant Orders   Bayer DCA Hb A1c Waived   Comprehensive metabolic panel with GFR   Lipid Panel w/o Chol/HDL Ratio     Other   Obesity   BMI 30.38.  Recommended eating smaller high protein, low fat meals more frequently and exercising 30 mins a day 5 times a week with a goal of 10-15lb weight loss in the next 3 months. Patient voiced their understanding and motivation to adhere to these recommendations.         Follow up plan: Return in about 6 months (around 12/10/2024) for Annual Physical -- after 11/22/24.

## 2024-06-11 NOTE — Assessment & Plan Note (Signed)
BMI 30.38.  Recommended eating smaller high protein, low fat meals more frequently and exercising 30 mins a day 5 times a week with a goal of 10-15lb weight loss in the next 3 months. Patient voiced their understanding and motivation to adhere to these recommendations.  

## 2024-06-12 ENCOUNTER — Ambulatory Visit: Payer: Self-pay | Admitting: Nurse Practitioner

## 2024-06-12 LAB — COMPREHENSIVE METABOLIC PANEL WITH GFR
ALT: 37 IU/L — ABNORMAL HIGH (ref 0–32)
AST: 24 IU/L (ref 0–40)
Albumin: 4.4 g/dL (ref 3.8–4.8)
Alkaline Phosphatase: 93 IU/L (ref 49–135)
BUN/Creatinine Ratio: 13 (ref 12–28)
BUN: 11 mg/dL (ref 8–27)
Bilirubin Total: 0.7 mg/dL (ref 0.0–1.2)
CO2: 25 mmol/L (ref 20–29)
Calcium: 9.5 mg/dL (ref 8.7–10.3)
Chloride: 103 mmol/L (ref 96–106)
Creatinine, Ser: 0.86 mg/dL (ref 0.57–1.00)
Globulin, Total: 2.5 g/dL (ref 1.5–4.5)
Glucose: 167 mg/dL — ABNORMAL HIGH (ref 70–99)
Potassium: 4.6 mmol/L (ref 3.5–5.2)
Sodium: 141 mmol/L (ref 134–144)
Total Protein: 6.9 g/dL (ref 6.0–8.5)
eGFR: 70 mL/min/1.73 (ref >59)

## 2024-06-12 LAB — LIPID PANEL W/O CHOL/HDL RATIO
Cholesterol, Total: 157 mg/dL (ref 100–199)
HDL: 38 mg/dL — ABNORMAL LOW (ref >39)
LDL Chol Calc (NIH): 83 mg/dL (ref 0–99)
Triglycerides: 217 mg/dL — ABNORMAL HIGH (ref 0–149)
VLDL Cholesterol Cal: 36 mg/dL (ref 5–40)

## 2024-06-12 NOTE — Progress Notes (Signed)
**Note De-Identified Few Obfuscation** Contacted [facility]  Good afternoon Suzanne Jennings, your labs have returned: - Kidney function, creatinine and eGFR, remains normal. Liver function has improved since last check, only ALT a little elevated. - Lipid panel continues to show some elevation in triglycerides and LDL a bit above goal. Would like to see it get under 70.  We may change your statin to Rosuvastatin next visit or add on Zetia to get levels down more. Any questions? Keep being amazing!!  Thank you for allowing me to participate in your care.  I appreciate you. Kindest regards, Deklin Bieler

## 2024-07-21 ENCOUNTER — Ambulatory Visit

## 2024-07-21 VITALS — BP 140/70 | HR 70 | Temp 98.1°F | Resp 15 | Ht 60.51 in | Wt 156.8 lb

## 2024-07-21 DIAGNOSIS — J069 Acute upper respiratory infection, unspecified: Secondary | ICD-10-CM | POA: Diagnosis not present

## 2024-07-21 NOTE — Patient Instructions (Signed)
Fol

## 2024-07-21 NOTE — Progress Notes (Signed)
**Note De-Identified [place] Obfuscation** BP (!) 168/79 (BP Location: Left Arm, Patient Position: Sitting, Cuff Size: Large)   Pulse 70   Temp 98.1 F (36.7 C) (Oral)   Resp 15   Ht 5' 0.51 (1.537 m)   Wt 156 lb 12.8 oz (71.1 kg)   LMP  (LMP Unknown)   SpO2 95%   BMI 30.11 kg/m    Subjective:    Patient ID: Suzanne Jennings, female    DOB: [birth year], 73 y.o.   MRN: [id number]  HPI: Suzanne Jennings is a 73 y.o. female with 7 days of cold symptoms of runny nose with clear drainage, sneezing, coughing. She reports a few days of fever and chills that have resolved. She tested herself for covid and flu at home, both negative. Hx of hypertension; taking equate high blood pressure cold and flu medication.  Chief Complaint  Patient presents with   URI    Started last Monday. Right now feeling better. Had headache, achy body, sneezing, coughing, hard to sleep. Ongoing coughing and congestion. Has not been able to cough up the phlegm.     Relevant past medical, surgical, family and social history reviewed and updated as indicated. Interim medical history since our last visit reviewed. Allergies and medications reviewed and updated.  Review of Systems  Constitutional:  Positive for chills. Negative for activity change, appetite change, fever and unexpected weight change.  HENT:  Positive for congestion, postnasal drip, rhinorrhea and sneezing. Negative for dental problem, ear discharge, ear pain, facial swelling, sinus pressure, sinus pain, sore throat and trouble swallowing.   Eyes:  Negative for pain.  Respiratory:  Positive for cough. Negative for shortness of breath, wheezing and stridor.   Cardiovascular:  Negative for chest pain and palpitations.  Gastrointestinal:  Negative for abdominal pain, diarrhea, nausea and vomiting.  Genitourinary:  Negative for difficulty urinating.  Musculoskeletal:  Positive for myalgias.       Has improved  Neurological:  Negative for dizziness, light-headedness and headaches.  Hematological:   Negative for adenopathy.  Psychiatric/Behavioral:  Negative for confusion.     Per HPI unless specifically indicated above     Objective:    BP (!) 168/79 (BP Location: Left Arm, Patient Position: Sitting, Cuff Size: Large)   Pulse 70   Temp 98.1 F (36.7 C) (Oral)   Resp 15   Ht 5' 0.51 (1.537 m)   Wt 156 lb 12.8 oz (71.1 kg)   LMP  (LMP Unknown)   SpO2 95%   BMI 30.11 kg/m   Wt Readings from Last 3 Encounters:  07/21/24 156 lb 12.8 oz (71.1 kg)  06/11/24 158 lb 3.2 oz (71.8 kg)  12/11/23 155 lb (70.3 kg)    Physical Exam Constitutional:      General: She is not in acute distress.    Appearance: Normal appearance.  HENT:     Right Ear: Ear canal and external ear normal. There is impacted cerumen.     Left Ear: Tympanic membrane, ear canal and external ear normal. There is no impacted cerumen.     Nose: Rhinorrhea present.     Mouth/Throat:     Mouth: Mucous membranes are moist.     Pharynx: No oropharyngeal exudate or posterior oropharyngeal erythema.  Eyes:     Conjunctiva/sclera: Conjunctivae normal.  Cardiovascular:     Rate and Rhythm: Normal rate and regular rhythm.  Pulmonary:     Effort: Pulmonary effort is normal. No respiratory distress.     Breath sounds: Normal breath **Note De-Identified [place] Obfuscation** sounds. No stridor. No wheezing, rhonchi or rales.  Chest:     Chest wall: No tenderness.  Abdominal:     Palpations: Abdomen is soft.     Tenderness: There is no rebound.  Musculoskeletal:     Cervical back: Normal range of motion and neck supple.  Lymphadenopathy:     Cervical: No cervical adenopathy.  Skin:    General: Skin is warm and dry.  Neurological:     Mental Status: She is alert.     Results for orders placed or performed in visit on 06/11/24  Bayer DCA Hb A1c Waived   Collection Time: 06/11/24  9:21 AM  Result Value Ref Range   HB A1C (BAYER DCA - WAIVED) 7.3 (H) 4.8 - 5.6 %  Comprehensive metabolic panel with GFR   Collection Time: 06/11/24  9:21 AM  Result  Value Ref Range   Glucose 167 (H) 70 - 99 mg/dL   BUN 11 8 - 27 mg/dL   Creatinine, Ser 9.13 0.57 - 1.00 mg/dL   eGFR 70 >40 fO/fpw/8.26   BUN/Creatinine Ratio 13 12 - 28   Sodium 141 134 - 144 mmol/L   Potassium 4.6 3.5 - 5.2 mmol/L   Chloride 103 96 - 106 mmol/L   CO2 25 20 - 29 mmol/L   Calcium  9.5 8.7 - 10.3 mg/dL   Total Protein 6.9 6.0 - 8.5 g/dL   Albumin 4.4 3.8 - 4.8 g/dL   Globulin, Total 2.5 1.5 - 4.5 g/dL   Bilirubin Total 0.7 0.0 - 1.2 mg/dL   Alkaline Phosphatase 93 49 - 135 IU/L   AST 24 0 - 40 IU/L   ALT 37 (H) 0 - 32 IU/L  Lipid Panel w/o Chol/HDL Ratio   Collection Time: 06/11/24  9:21 AM  Result Value Ref Range   Cholesterol, Total 157 100 - 199 mg/dL   Triglycerides 782 (H) 0 - 149 mg/dL   HDL 38 (L) >60 mg/dL   VLDL Cholesterol Cal 36 5 - 40 mg/dL   LDL Chol Calc (NIH) 83 0 - 99 mg/dL      Assessment & Plan:   Assessment & Plan Upper respiratory infection with cough and congestion Pt reports 7 days of coughin and clear nasal congestion. Lungs clear to auscultation, coughing with deep respirations. Declined covid/flu testing in clinic.      Follow up plan: Take Mucinex DM OTC for coughing and congestion - recommended drinking a lot of fluids this week. May continue acetaminophen  or ibuprofen  for body aches/chills. If worsening fever, chills, weakness, SOB, coughing up blood, or chest pain occur, go to ER, otherwise, follow up in clinic in 1 week for lung recheck.

## 2024-07-28 ENCOUNTER — Ambulatory Visit

## 2024-10-16 ENCOUNTER — Ambulatory Visit

## 2024-12-12 ENCOUNTER — Encounter: Admitting: Nurse Practitioner
